# Patient Record
Sex: Male | Born: 1960 | Race: Black or African American | Hispanic: No | Marital: Married | Smoking: Former smoker
Health system: Southern US, Community
[De-identification: ages and names within clinical notes are randomized; demographics above are authoritative.]

## PROBLEM LIST (undated history)

## (undated) DIAGNOSIS — C259 Malignant neoplasm of pancreas, unspecified: Secondary | ICD-10-CM

## (undated) DIAGNOSIS — K219 Gastro-esophageal reflux disease without esophagitis: Secondary | ICD-10-CM

## (undated) DIAGNOSIS — C801 Malignant (primary) neoplasm, unspecified: Secondary | ICD-10-CM

## (undated) DIAGNOSIS — D649 Anemia, unspecified: Secondary | ICD-10-CM

## (undated) DIAGNOSIS — I10 Essential (primary) hypertension: Secondary | ICD-10-CM

---

## 1999-08-18 ENCOUNTER — Emergency Department (HOSPITAL_COMMUNITY): Admission: EM | Admit: 1999-08-18 | Discharge: 1999-08-18 | Payer: Self-pay | Admitting: Emergency Medicine

## 1999-08-18 ENCOUNTER — Encounter: Payer: Self-pay | Admitting: Emergency Medicine

## 2005-12-19 ENCOUNTER — Emergency Department (HOSPITAL_COMMUNITY): Admission: EM | Admit: 2005-12-19 | Discharge: 2005-12-19 | Payer: Self-pay | Admitting: *Deleted

## 2006-08-20 ENCOUNTER — Emergency Department (HOSPITAL_COMMUNITY): Admission: EM | Admit: 2006-08-20 | Discharge: 2006-08-20 | Payer: Self-pay | Admitting: Emergency Medicine

## 2010-12-29 LAB — URINALYSIS, ROUTINE W REFLEX MICROSCOPIC
Bilirubin Urine: NEGATIVE
Glucose, UA: NEGATIVE
Hgb urine dipstick: NEGATIVE
Ketones, ur: NEGATIVE
Nitrite: NEGATIVE
Protein, ur: NEGATIVE
Specific Gravity, Urine: 1.022
Urobilinogen, UA: 2 — ABNORMAL HIGH
pH: 6

## 2016-02-17 ENCOUNTER — Encounter (HOSPITAL_COMMUNITY): Payer: Self-pay

## 2016-02-17 ENCOUNTER — Emergency Department (HOSPITAL_COMMUNITY)
Admission: EM | Admit: 2016-02-17 | Discharge: 2016-02-17 | Disposition: A | Discharge status: Home / Self Care | Payer: Commercial Managed Care - PPO | Attending: Emergency Medicine | Admitting: Emergency Medicine

## 2016-02-17 ENCOUNTER — Emergency Department (HOSPITAL_COMMUNITY): Payer: Commercial Managed Care - PPO

## 2016-02-17 DIAGNOSIS — Y990 Civilian activity done for income or pay: Secondary | ICD-10-CM | POA: Diagnosis not present

## 2016-02-17 DIAGNOSIS — Y929 Unspecified place or not applicable: Secondary | ICD-10-CM | POA: Insufficient documentation

## 2016-02-17 DIAGNOSIS — I10 Essential (primary) hypertension: Secondary | ICD-10-CM | POA: Insufficient documentation

## 2016-02-17 DIAGNOSIS — Y9389 Activity, other specified: Secondary | ICD-10-CM | POA: Insufficient documentation

## 2016-02-17 DIAGNOSIS — R0789 Other chest pain: Secondary | ICD-10-CM | POA: Diagnosis not present

## 2016-02-17 DIAGNOSIS — X500XXA Overexertion from strenuous movement or load, initial encounter: Secondary | ICD-10-CM | POA: Diagnosis not present

## 2016-02-17 DIAGNOSIS — R079 Chest pain, unspecified: Secondary | ICD-10-CM | POA: Diagnosis present

## 2016-02-17 HISTORY — DX: Essential (primary) hypertension: I10

## 2016-02-17 LAB — BASIC METABOLIC PANEL
Anion gap: 7 (ref 5–15)
BUN: 14 mg/dL (ref 6–20)
CHLORIDE: 107 mmol/L (ref 101–111)
CO2: 23 mmol/L (ref 22–32)
Calcium: 9.2 mg/dL (ref 8.9–10.3)
Creatinine, Ser: 1.38 mg/dL — ABNORMAL HIGH (ref 0.61–1.24)
GFR calc non Af Amer: 57 mL/min — ABNORMAL LOW (ref >60)
Glucose, Bld: 101 mg/dL — ABNORMAL HIGH (ref 65–99)
POTASSIUM: 3.5 mmol/L (ref 3.5–5.1)
Sodium: 137 mmol/L (ref 135–145)

## 2016-02-17 LAB — I-STAT TROPONIN, ED: TROPONIN I, POC: 0.03 ng/mL (ref 0.00–0.08)

## 2016-02-17 LAB — CBC
HEMATOCRIT: 38.5 % — AB (ref 39.0–52.0)
HEMOGLOBIN: 12.3 g/dL — AB (ref 13.0–17.0)
MCH: 27.5 pg (ref 26.0–34.0)
MCHC: 31.9 g/dL (ref 30.0–36.0)
MCV: 85.9 fL (ref 78.0–100.0)
Platelets: 259 10*3/uL (ref 150–400)
RBC: 4.48 MIL/uL (ref 4.22–5.81)
RDW: 14.6 % (ref 11.5–15.5)
WBC: 5.9 10*3/uL (ref 4.0–10.5)

## 2016-02-17 NOTE — ED Provider Notes (Signed)
[facility] DEPT Provider Note   CSN: [id number] Arrival date & time: 02/17/16  1923     History   Chief Complaint Chief Complaint  Patient presents with  . Chest Pain    HPI Frank Garrett is a 55 y.o. male.  HPI Patient is a 55 year old male with past medical history of hypertension who presents with left-sided chest pain. Patient reports chest pain started this morning around 11 AM he was lifting heavy boards at work. It is located over the left lower part of his chest and does not radiate elsewhere. No associated symptoms. Denies diaphoresis, nausea, vomiting, short of breath. His pain persisted throughout the day and patient went to urgent care for evaluation. EKG performed there showed abnormalities and patient was referred to the emergency department for further evaluation. Unfortunately was unavailable for review. Patient reports he was told his heart rate was too low. He wears a Fit-Bit monitor and states his normal heart rate is in the 50s. Patient quit smoking 5 years ago. Denies illicit substances including cocaine use. No recent fever, cough, vomiting, diarrhea or other illness. No known family history of CAD.   Past Medical History:  Diagnosis Date  . Hypertension     There are no active problems to display for this patient.   History reviewed. No pertinent surgical history.     Home Medications    Prior to Admission medications   Not on File    Family History History reviewed. No pertinent family history.  Social History Social History  Substance Use Topics  . Smoking status: Never Smoker  . Smokeless tobacco: Never Used  . Alcohol use No     Allergies   Patient has no known allergies.   Review of Systems Review of Systems  Constitutional: Negative for chills and fever.  HENT: Negative for ear pain and sore throat.   Eyes: Negative for pain and visual disturbance.  Respiratory: Negative for cough and shortness of breath.   Cardiovascular:  Positive for chest pain. Negative for palpitations.  Gastrointestinal: Negative for abdominal pain and vomiting.  Genitourinary: Negative for dysuria and hematuria.  Musculoskeletal: Negative for arthralgias and back pain.  Skin: Negative for color change and rash.  Neurological: Negative for seizures and syncope.  All other systems reviewed and are negative.    Physical Exam Updated Vital Signs BP 135/85 (BP Location: Right Arm)   Pulse (!) 51   Temp 98 F (36.7 C) (Oral)   Resp 16   Ht 5\' 8"  (1.727 m)   Wt 89.8 kg   SpO2 100%   BMI 30.11 kg/m   Physical Exam  Constitutional: He appears well-developed and well-nourished.  HENT:  Head: Normocephalic and atraumatic.  Eyes: Conjunctivae are normal.  Neck: Neck supple.  Cardiovascular: Normal rate and regular rhythm.   No murmur heard. Pulmonary/Chest: Effort normal and breath sounds normal. No respiratory distress. He has no wheezes. He has no rales.  Abdominal: Soft. There is no tenderness.  Musculoskeletal: He exhibits no edema.  Neurological: He is alert.  Skin: Skin is warm and dry.  Psychiatric: He has a normal mood and affect.  Nursing note and vitals reviewed.    ED Treatments / Results  Labs (all labs ordered are listed, but only abnormal results are displayed) Labs Reviewed  CBC - Abnormal; Notable for the following:       Result Value   Hemoglobin 12.3 (*)    HCT 38.5 (*)    All other components within normal  limits  BASIC METABOLIC PANEL  I-STAT TROPOININ, ED    EKG  EKG Interpretation  Date/Time:  Thursday February 17 2016 19:56:22 EST Ventricular Rate:  50 PR Interval:  182 QRS Duration: 100 QT Interval:  442 QTC Calculation: 402 R Axis:   78 Text Interpretation:  Sinus bradycardia ST & T wave abnormality, consider lateral ischemia Abnormal ECG No significant change since last tracing Confirmed by Gerald Leitz ([id number]) on 02/17/2016 8:03:16 PM       Radiology Dg Chest 2  View  Result Date: 02/17/2016 CLINICAL DATA:  55 year old male with left side chest pain acute onset today. Initial encounter. EXAM: CHEST  2 VIEW COMPARISON:  None. FINDINGS: Normal cardiac size and mediastinal contours. Visualized tracheal air column is within normal limits. Lung volumes at the upper limits of normal. No pneumothorax, pulmonary edema, pleural effusion or confluent pulmonary opacity. Mild upper thoracic scoliosis. No acute osseous abnormality identified. Negative visible bowel gas pattern. IMPRESSION: No acute cardiopulmonary abnormality. Electronically Signed   By: Genevie Ziggy Reveles M.D.   On: 02/17/2016 19:53    Procedures Procedures (including critical care time)  Medications Ordered in ED Medications - No data to display   Initial Impression / Assessment and Plan / ED Course  I have reviewed the triage vital signs and the nursing notes.  Pertinent labs & imaging results that were available during my care of the patient were reviewed by me and considered in my medical decision making (see chart for details).  Clinical Course    Patient is a 55 year old male who presents with left sided chest pain. Afebrile, VSS. Bradycardic (patient states heart rate is normal for him) with no other focal findings on exam. Discomfort is reproduced with sitting and twisting. Received full dose ASA and took ibuprofen prior to arrival with some relief. EKG shows sinus bradycardia with nonspecific ST and T wave changes in lateral leads. Chest x-ray unremarkable. CBC, BMP unremarkable. Troponin WNL. Pain is reproducible on exam. History and findings are not consistent with cardiac chest pain. Patient has had constant chest pain since 11 AM this morning and does not have elevation in his troponin. Do not think delta troponin is necessary. Patient is overall low risk other than age of 62, does not have hypoxia or tachycardia, does not have calf swelling or tenderness, doubt PE. No signs of pneumonia, fluid  overload, or widened mediastinum on CXR. Likely musculoskeletal chest pain.   Patient was discharged in stable condition. Strict chest pain return precautions given. Advised follow-up with primary doctor for reevaluation. Advised rest and Tylenol for pain. Patient reports understanding and agreement with plan.  Patient seen and discussed with Dr. Laverta Baltimore, ED attending  Final Clinical Impressions(s) / ED Diagnoses   Final diagnoses:  Atypical chest pain    New Prescriptions New Prescriptions   No medications on file     Gibson Ramp, MD 02/18/16 0234    Margette Fast, MD 02/18/16 [phone]

## 2016-02-17 NOTE — Discharge Instructions (Signed)
Based on your examination, labs and imaging today the chest pain your experiencing is likely musculoskeletal pain from heavy lifting. He may take Tylenol or ibuprofen at home for pain.   However your EKG shows some changes. This may be normal for you, but we do not have a comparison EKG available. You should follow up with your primary care doctor to discuss whether you need referral to cardiology for further outpatient testing.  If you experience severely worsening chest pain, with associated shortness of breath, sweating, vomiting or any other new or worsening symptoms return to the emergency department for evaluation.

## 2016-02-17 NOTE — ED Notes (Signed)
EDP would like KG repeated in 15 mins.  Pt was taken to x-ray.

## 2016-02-17 NOTE — ED Triage Notes (Signed)
Pt reports left side chest pain since this morning when he was working. Pt went to [facility] and was told he had an irregular EKG and was sent here. Denies SOB, n/v, dizziness or any other sx.

## 2016-02-17 NOTE — ED Notes (Signed)
Pt departed in NAD, refused use of wheelchair.  

## 2016-04-13 DIAGNOSIS — C801 Malignant (primary) neoplasm, unspecified: Secondary | ICD-10-CM

## 2016-04-13 HISTORY — DX: Malignant (primary) neoplasm, unspecified: C80.1

## 2016-04-17 DIAGNOSIS — D638 Anemia in other chronic diseases classified elsewhere: Secondary | ICD-10-CM

## 2016-04-28 DIAGNOSIS — R7989 Other specified abnormal findings of blood chemistry: Secondary | ICD-10-CM | POA: Insufficient documentation

## 2016-05-17 DIAGNOSIS — K8689 Other specified diseases of pancreas: Secondary | ICD-10-CM | POA: Insufficient documentation

## 2016-06-27 ENCOUNTER — Emergency Department (HOSPITAL_BASED_OUTPATIENT_CLINIC_OR_DEPARTMENT_OTHER)
Admission: EM | Admit: 2016-06-27 | Discharge: 2016-06-27 | Disposition: A | Discharge status: Home / Self Care | Payer: Commercial Managed Care - PPO | Source: Home / Self Care | Attending: Emergency Medicine | Admitting: Emergency Medicine

## 2016-06-27 ENCOUNTER — Encounter (HOSPITAL_COMMUNITY): Payer: Self-pay | Admitting: Emergency Medicine

## 2016-06-27 ENCOUNTER — Emergency Department (HOSPITAL_COMMUNITY)
Admit: 2016-06-27 | Discharge: 2016-06-27 | Disposition: A | Discharge status: Home / Self Care | Payer: Commercial Managed Care - PPO | Attending: Emergency Medicine | Admitting: Emergency Medicine

## 2016-06-27 ENCOUNTER — Emergency Department (HOSPITAL_COMMUNITY)
Admission: EM | Admit: 2016-06-27 | Discharge: 2016-06-27 | Disposition: A | Discharge status: Home / Self Care | Payer: Commercial Managed Care - PPO | Attending: Dermatology | Admitting: Dermatology

## 2016-06-27 DIAGNOSIS — M7989 Other specified soft tissue disorders: Secondary | ICD-10-CM | POA: Insufficient documentation

## 2016-06-27 DIAGNOSIS — Z79899 Other long term (current) drug therapy: Secondary | ICD-10-CM | POA: Insufficient documentation

## 2016-06-27 DIAGNOSIS — I10 Essential (primary) hypertension: Secondary | ICD-10-CM | POA: Diagnosis not present

## 2016-06-27 DIAGNOSIS — Z5321 Procedure and treatment not carried out due to patient leaving prior to being seen by health care provider: Secondary | ICD-10-CM | POA: Diagnosis not present

## 2016-06-27 DIAGNOSIS — Z859 Personal history of malignant neoplasm, unspecified: Secondary | ICD-10-CM | POA: Insufficient documentation

## 2016-06-27 HISTORY — DX: Malignant (primary) neoplasm, unspecified: C80.1

## 2016-06-27 NOTE — Progress Notes (Signed)
Attempted to perform left upper extremity venous duplex. Patient not in emergency department at this time, please contact us if patient returns.  06/27/2016 1:18 PM Maudry Mayhew, BS, RVT, [place], [place]  [facility] [phone]

## 2016-06-27 NOTE — Progress Notes (Addendum)
*  PRELIMINARY RESULTS* Vascular Ultrasound Left upper extremity venous duplex has been completed.  Preliminary findings: Findings of DVT involving the left subclavian, axillary, and brachial veins.  Per Kieth Brightly with Dr. Kaleen Mask, they are calling in Rx for Xarelto to patient's pharmacy. Patient can leave. 2:48 PM      Performed by: Maudry Mayhew, RDMS, RDCS, RVT Note entered by: Landry Mellow, RDMS, RVT  06/27/2016

## 2016-06-27 NOTE — ED Triage Notes (Signed)
Pt here for doppler of left upper extremity; pt with port placed 4 weeks ago for treatment of pancreatic CA and now has swelling and stiffness

## 2016-06-27 NOTE — ED Notes (Signed)
Patient transported to Ultrasound 

## 2016-07-11 ENCOUNTER — Emergency Department (HOSPITAL_COMMUNITY): Payer: Commercial Managed Care - PPO

## 2016-07-11 ENCOUNTER — Encounter (HOSPITAL_COMMUNITY): Payer: Self-pay | Admitting: Emergency Medicine

## 2016-07-11 ENCOUNTER — Emergency Department (HOSPITAL_BASED_OUTPATIENT_CLINIC_OR_DEPARTMENT_OTHER)
Admit: 2016-07-11 | Discharge: 2016-07-11 | Disposition: A | Discharge status: Home / Self Care | Payer: Commercial Managed Care - PPO

## 2016-07-11 ENCOUNTER — Emergency Department (HOSPITAL_COMMUNITY)
Admission: EM | Admit: 2016-07-11 | Discharge: 2016-07-11 | Disposition: A | Discharge status: Home / Self Care | Payer: Commercial Managed Care - PPO | Attending: Emergency Medicine | Admitting: Emergency Medicine

## 2016-07-11 DIAGNOSIS — Z79899 Other long term (current) drug therapy: Secondary | ICD-10-CM | POA: Diagnosis not present

## 2016-07-11 DIAGNOSIS — I10 Essential (primary) hypertension: Secondary | ICD-10-CM | POA: Insufficient documentation

## 2016-07-11 DIAGNOSIS — M7989 Other specified soft tissue disorders: Secondary | ICD-10-CM

## 2016-07-11 DIAGNOSIS — I82A12 Acute embolism and thrombosis of left axillary vein: Secondary | ICD-10-CM | POA: Diagnosis not present

## 2016-07-11 DIAGNOSIS — R2232 Localized swelling, mass and lump, left upper limb: Secondary | ICD-10-CM | POA: Diagnosis present

## 2016-07-11 MED ORDER — RIVAROXABAN (XARELTO) VTE STARTER PACK (15 & 20 MG): ORAL_TABLET | ORAL | 0 refills | Status: DC

## 2016-07-11 NOTE — Progress Notes (Signed)
*  PRELIMINARY RESULTS* Vascular Ultrasound Left upper extremity venous duplex has been completed.  Preliminary findings: the left upper extremity is positive for deep vein thrombosis in the subclavian and axillary veins.  Superficial vein thrombosis is noted in the basilic vein.  Exam appears unchanged from prior study.  Everrett Coombe 07/11/2016, 6:03 PM

## 2016-07-11 NOTE — ED Triage Notes (Signed)
Pt here for swelling in left arm with hx of blood clots; pt on xerelto and has port a cath on that side for chemo

## 2016-07-11 NOTE — ED Provider Notes (Signed)
[facility] DEPT Provider Note   CSN: [id number] Arrival date & time: 07/11/16  1510     History   Chief Complaint Chief Complaint  Patient presents with  . Arm Swelling    HPI Frank Garrett is a 56 y.o. male.  The history is provided by the patient, the spouse and medical records.  Arm Injury   This is a recurrent problem. Episode onset: This morning. The problem occurs constantly. The problem has not changed since onset.The pain is present in the left arm. Quality: Swelling, not pain. The patient is experiencing no pain. Pertinent negatives include no numbness, full range of motion, no stiffness and no tingling. Treatments tried: on Xarelto. There has been no history of extremity trauma.    Past Medical History:  Diagnosis Date  . Cancer ([place])   . Hypertension     There are no active problems to display for this patient.   History reviewed. No pertinent surgical history.     Home Medications    Prior to Admission medications   Not on File    Family History History reviewed. No pertinent family history.  Social History Social History  Substance Use Topics  . Smoking status: Never Smoker  . Smokeless tobacco: Never Used  . Alcohol use No     Allergies   Patient has no known allergies.   Review of Systems Review of Systems  Constitutional: Negative for chills and fever.  HENT: Negative for ear pain and sore throat.   Eyes: Negative for pain and visual disturbance.  Respiratory: Negative for cough and shortness of breath.   Cardiovascular: Negative for chest pain and palpitations.  Gastrointestinal: Negative for abdominal pain and vomiting.  Genitourinary: Negative for dysuria and hematuria.  Musculoskeletal: Negative for arthralgias, back pain and stiffness.       Arm swelling  Skin: Negative for color change and rash.  Neurological: Negative for tingling, seizures, syncope and numbness.  All other systems reviewed and are  negative.    Physical Exam Updated Vital Signs BP 98/63 (BP Location: Right Arm)   Pulse 84   Temp 99.1 F (37.3 C) (Oral)   Resp 17   Ht 5' (1.524 m)   Wt 77.6 kg   SpO2 99%   BMI 33.40 kg/m   Physical Exam  Constitutional: He appears well-developed and well-nourished.  HENT:  Head: Normocephalic and atraumatic.  Eyes: Conjunctivae are normal.  Neck: Neck supple.  Cardiovascular: Normal rate and regular rhythm.   No murmur heard. Pulmonary/Chest: Effort normal and breath sounds normal. No respiratory distress.  Abdominal: Soft. There is no tenderness.  Musculoskeletal: He exhibits no edema.  Mild swelling of left upper arm w/soft compartments, FROM at left shoulder/elbow/wrist w/o pain, no overlying cellulitic changes, symmetric radial pulses  Neurological: He is alert.  Full motor and sensory function in the LUE  Skin: Skin is warm and dry.  Psychiatric: He has a normal mood and affect.  Nursing note and vitals reviewed.   ED Treatments / Results  Labs (all labs ordered are listed, but only abnormal results are displayed) Labs Reviewed - No data to display  EKG  EKG Interpretation None       Radiology Dg Chest Portable 1 View  Result Date: 07/11/2016 CLINICAL DATA:  Left arm swelling.  History of hypertension. EXAM: PORTABLE CHEST 1 VIEW COMPARISON:  02/17/2016. FINDINGS: 1642 hour. Left subclavian Port-A-Cath tip extends to the SVC right atrial junction. The heart size and mediastinal contours are stable. The  lungs are clear. There is no pleural effusion or pneumothorax. No acute osseous findings are evident. IMPRESSION: No active cardiopulmonary process. Left subclavian Port-A-Cath appears satisfactorily positioned. Electronically Signed   By: Richardean Sale M.D.   On: 07/11/2016 16:53    Procedures Procedures (including critical care time)  Medications Ordered in ED Medications - No data to display   Initial Impression / Assessment and Plan / ED  Course  I have reviewed the triage vital signs and the nursing notes.  Pertinent labs & imaging results that were available during my care of the patient were reviewed by me and considered in my medical decision making (see chart for details).    Pt with h/o pancreatic CA & prior DVT presents with left arm swelling.  Diagnosed w/thrombus in left subclavian, left axillary & left brachial veins on 06/28/16; was initially on Xarelto 20mg  QDx7d, but was told to start taking 15mg  QD starting on day 8 (wife mentions that there was some debate b/w the Oncologist & the Pharmacist when they were deciding on his a/c regimen, but she doesn't know the specifics). This morning, he noticed some tightness in his arm this morning, so came back tot he ED b/c he's concerned the clots may be back. Denies F/C, HA, lightheadedness, cough, CP, SOB, N/V/D, urinary symptoms. Last chemo 4/27 & has been doing well since.  VS & exam as above. CXR WNL. LUE [place] essentially unchanged w/thrombosis in left subclavian, axillary, & brachial veins.  Explained all results to the Pt & wife. Will discharge the Pt home with prescription for Xarelto starter pack. Recommending they call their Oncologist in the morning. ED return precautions provided. Pt acknowledged understanding of, and concurrence with the plan. All questions answered to his satisfaction. In stable condition at the time of discharge.  Final Clinical Impressions(s) / ED Diagnoses   Final diagnoses:  Acute deep vein thrombosis (DVT) of axillary vein of left upper extremity (HCC)    New Prescriptions New Prescriptions   RIVAROXABAN 15 & 20 MG TBPK    Take as directed on package: Start with one 15mg  tablet by mouth twice a day with food. On Day 22, switch to one 20mg  tablet once a day with food.     Jenny Reichmann, MD 07/11/16 1846    Duffy Bruce, MD 07/13/16 [phone]

## 2016-07-12 ENCOUNTER — Other Ambulatory Visit: Payer: Self-pay | Admitting: *Deleted

## 2016-10-19 HISTORY — PX: PANCREAS SURGERY: SHX731

## 2016-10-19 HISTORY — PX: SPLENECTOMY, TOTAL: SHX788

## 2016-10-19 HISTORY — PX: DISTAL PANCREATECTOMY: SHX1468

## 2016-10-19 HISTORY — PX: CHOLECYSTECTOMY: SHX55

## 2016-11-09 ENCOUNTER — Inpatient Hospital Stay (HOSPITAL_COMMUNITY)
Admission: EM | Admit: 2016-11-09 | Discharge: 2016-11-11 | DRG: 862 | Disposition: A | Discharge status: Home Health | Payer: Commercial Managed Care - PPO | Attending: Internal Medicine | Admitting: Internal Medicine

## 2016-11-09 ENCOUNTER — Emergency Department (HOSPITAL_COMMUNITY): Payer: Commercial Managed Care - PPO

## 2016-11-09 ENCOUNTER — Encounter (HOSPITAL_COMMUNITY): Payer: Self-pay

## 2016-11-09 DIAGNOSIS — T8130XA Disruption of wound, unspecified, initial encounter: Secondary | ICD-10-CM | POA: Diagnosis present

## 2016-11-09 DIAGNOSIS — Z79899 Other long term (current) drug therapy: Secondary | ICD-10-CM

## 2016-11-09 DIAGNOSIS — Z7901 Long term (current) use of anticoagulants: Secondary | ICD-10-CM | POA: Diagnosis not present

## 2016-11-09 DIAGNOSIS — Z8507 Personal history of malignant neoplasm of pancreas: Secondary | ICD-10-CM | POA: Diagnosis not present

## 2016-11-09 DIAGNOSIS — Z86718 Personal history of other venous thrombosis and embolism: Secondary | ICD-10-CM | POA: Diagnosis not present

## 2016-11-09 DIAGNOSIS — Z885 Allergy status to narcotic agent status: Secondary | ICD-10-CM | POA: Diagnosis not present

## 2016-11-09 DIAGNOSIS — K651 Peritoneal abscess: Secondary | ICD-10-CM | POA: Diagnosis present

## 2016-11-09 DIAGNOSIS — T814XXA Infection following a procedure, initial encounter: Secondary | ICD-10-CM | POA: Diagnosis present

## 2016-11-09 DIAGNOSIS — K910 Vomiting following gastrointestinal surgery: Secondary | ICD-10-CM | POA: Diagnosis present

## 2016-11-09 DIAGNOSIS — E872 Acidosis, unspecified: Secondary | ICD-10-CM

## 2016-11-09 DIAGNOSIS — N179 Acute kidney failure, unspecified: Secondary | ICD-10-CM | POA: Diagnosis present

## 2016-11-09 DIAGNOSIS — Z90411 Acquired partial absence of pancreas: Secondary | ICD-10-CM | POA: Diagnosis not present

## 2016-11-09 DIAGNOSIS — Z87891 Personal history of nicotine dependence: Secondary | ICD-10-CM

## 2016-11-09 DIAGNOSIS — Z8619 Personal history of other infectious and parasitic diseases: Secondary | ICD-10-CM | POA: Diagnosis not present

## 2016-11-09 DIAGNOSIS — Y848 Other medical procedures as the cause of abnormal reaction of the patient, or of later complication, without mention of misadventure at the time of the procedure: Secondary | ICD-10-CM | POA: Diagnosis present

## 2016-11-09 DIAGNOSIS — Z9081 Acquired absence of spleen: Secondary | ICD-10-CM

## 2016-11-09 DIAGNOSIS — A419 Sepsis, unspecified organism: Secondary | ICD-10-CM | POA: Diagnosis present

## 2016-11-09 DIAGNOSIS — IMO0001 Reserved for inherently not codable concepts without codable children: Secondary | ICD-10-CM

## 2016-11-09 DIAGNOSIS — Z9049 Acquired absence of other specified parts of digestive tract: Secondary | ICD-10-CM | POA: Diagnosis not present

## 2016-11-09 DIAGNOSIS — R748 Abnormal levels of other serum enzymes: Secondary | ICD-10-CM

## 2016-11-09 DIAGNOSIS — R188 Other ascites: Secondary | ICD-10-CM | POA: Diagnosis present

## 2016-11-09 DIAGNOSIS — R112 Nausea with vomiting, unspecified: Secondary | ICD-10-CM | POA: Diagnosis not present

## 2016-11-09 DIAGNOSIS — I10 Essential (primary) hypertension: Secondary | ICD-10-CM | POA: Diagnosis present

## 2016-11-09 DIAGNOSIS — R7989 Other specified abnormal findings of blood chemistry: Secondary | ICD-10-CM | POA: Diagnosis not present

## 2016-11-09 DIAGNOSIS — Z9221 Personal history of antineoplastic chemotherapy: Secondary | ICD-10-CM

## 2016-11-09 DIAGNOSIS — E860 Dehydration: Secondary | ICD-10-CM | POA: Diagnosis present

## 2016-11-09 HISTORY — DX: Gastro-esophageal reflux disease without esophagitis: K21.9

## 2016-11-09 HISTORY — DX: Anemia, unspecified: D64.9

## 2016-11-09 LAB — CBC
HEMATOCRIT: 37.2 % — AB (ref 39.0–52.0)
Hemoglobin: 11.6 g/dL — ABNORMAL LOW (ref 13.0–17.0)
MCH: 26.7 pg (ref 26.0–34.0)
MCHC: 31.2 g/dL (ref 30.0–36.0)
MCV: 85.5 fL (ref 78.0–100.0)
Platelets: 699 10*3/uL — ABNORMAL HIGH (ref 150–400)
RBC: 4.35 MIL/uL (ref 4.22–5.81)
RDW: 16.3 % — ABNORMAL HIGH (ref 11.5–15.5)
WBC: 11.3 10*3/uL — AB (ref 4.0–10.5)

## 2016-11-09 LAB — COMPREHENSIVE METABOLIC PANEL
ALT: 64 U/L — ABNORMAL HIGH (ref 17–63)
AST: 27 U/L (ref 15–41)
Albumin: 3.6 g/dL (ref 3.5–5.0)
Alkaline Phosphatase: 401 U/L — ABNORMAL HIGH (ref 38–126)
Anion gap: 19 — ABNORMAL HIGH (ref 5–15)
BUN: 26 mg/dL — AB (ref 6–20)
CHLORIDE: 100 mmol/L — AB (ref 101–111)
CO2: 22 mmol/L (ref 22–32)
Calcium: 9.7 mg/dL (ref 8.9–10.3)
Creatinine, Ser: 2.26 mg/dL — ABNORMAL HIGH (ref 0.61–1.24)
GFR, EST AFRICAN AMERICAN: 36 mL/min — AB (ref >60)
GFR, EST NON AFRICAN AMERICAN: 31 mL/min — AB (ref >60)
Glucose, Bld: 113 mg/dL — ABNORMAL HIGH (ref 65–99)
POTASSIUM: 4.2 mmol/L (ref 3.5–5.1)
Sodium: 141 mmol/L (ref 135–145)
Total Bilirubin: 1.2 mg/dL (ref 0.3–1.2)
Total Protein: 8.6 g/dL — ABNORMAL HIGH (ref 6.5–8.1)

## 2016-11-09 LAB — I-STAT CG4 LACTIC ACID, ED
LACTIC ACID, VENOUS: 4.11 mmol/L — AB (ref 0.5–1.9)
LACTIC ACID, VENOUS: 1.04 mmol/L (ref 0.5–1.9)

## 2016-11-09 LAB — LIPASE, BLOOD: LIPASE: 111 U/L — AB (ref 11–51)

## 2016-11-09 MED ORDER — PIPERACILLIN-TAZOBACTAM 3.375 G IVPB
3.3750 g | Freq: Three times a day (TID) | INTRAVENOUS | Status: DC
  Administered 2016-11-09 – 2016-11-10 (×2): 3.375 g via INTRAVENOUS
  Filled 2016-11-09 (×4): qty 50

## 2016-11-09 MED ORDER — ONDANSETRON HCL 4 MG/2ML IJ SOLN
4.0000 mg | Freq: Four times a day (QID) | INTRAMUSCULAR | Status: DC | PRN
  Administered 2016-11-10: 4 mg via INTRAVENOUS
  Filled 2016-11-09: qty 2

## 2016-11-09 MED ORDER — SODIUM CHLORIDE 0.9 % IV BOLUS (SEPSIS)
1000.0000 mL | Freq: Once | INTRAVENOUS | Status: AC
  Administered 2016-11-09: 1000 mL via INTRAVENOUS

## 2016-11-09 MED ORDER — SODIUM CHLORIDE 0.9 % IV BOLUS (SEPSIS)
2000.0000 mL | Freq: Once | INTRAVENOUS | Status: AC
  Administered 2016-11-09: 2000 mL via INTRAVENOUS

## 2016-11-09 MED ORDER — ONDANSETRON HCL 4 MG/2ML IJ SOLN
4.0000 mg | Freq: Once | INTRAMUSCULAR | Status: AC
  Administered 2016-11-09: 4 mg via INTRAVENOUS
  Filled 2016-11-09: qty 2

## 2016-11-09 MED ORDER — PIPERACILLIN-TAZOBACTAM 3.375 G IVPB 30 MIN
3.3750 g | Freq: Once | INTRAVENOUS | Status: AC
  Administered 2016-11-09: 3.375 g via INTRAVENOUS
  Filled 2016-11-09: qty 50

## 2016-11-09 MED ORDER — SODIUM CHLORIDE 0.9 % IV SOLN
INTRAVENOUS | Status: DC
  Administered 2016-11-09 – 2016-11-11 (×4): via INTRAVENOUS

## 2016-11-09 MED ORDER — PROMETHAZINE HCL 25 MG/ML IJ SOLN: 12.5000 mg | Freq: Four times a day (QID) | INTRAMUSCULAR | Status: DC | PRN

## 2016-11-09 MED ORDER — ENOXAPARIN SODIUM 40 MG/0.4ML ~~LOC~~ SOLN
40.0000 mg | SUBCUTANEOUS | Status: DC
  Administered 2016-11-09 – 2016-11-10 (×2): 40 mg via SUBCUTANEOUS
  Filled 2016-11-09 (×2): qty 0.4

## 2016-11-09 NOTE — H&P (Signed)
Internal Medicine Teaching Service 11/09/2016 Attending Physician: Dr. Virgel Manifold, MD  Patient Name:  Frank Garrett Age: 56 y.o.  DOB: 22-Feb-1961 Gender: male  PCP: Gregor Hams, FNP MRN: [id number]  First Contact: Eston Esters, MS3 Pager: [phone]  Second Dr. Colbert Ewing, MD Pager:  [phone]  Third Contact: Dr. Eppie Gibson    After Hours Contact (After 5 PM / Weekends / Holidays) First: [phone] Second: [phone]     Chief Complaint:  Chief Complaint  Patient presents with  . Emesis    History of Present Illness: Eschol Auxier is a 56 y.o. male s/p pancreaticoduodenectomy, spleen resection, cholecystectomy on 02/58/52 at [facility] complicated by post-op wound dishesence and wound vac placement due to n/v who presents with nausea and vomiting. Per the patient & family, he presented with abdominal pain & nausea in 04/2016 which did not resolve with metamucil, so his physician ordered a CT & he was found to have pancreatic mass. EGD & FNA were positive for pancreatic adenocarcinoma, so he had neoadjuvant chemotherapy followed by a pancreaticoduodenectomy, spleen resection, cholecystectomy on 10/19/16 at the [facility] in [place], [place]. This was complicated by wound dehiscence so a wound vac was placed, and he returned to [facility] on 11/06/16. He was doing well, eating and drinking without excessive vomiting until yesterday when he began vomiting green bilious vomit & was unable to keep food or liquid down since breakfast yesterday (08/29). He denies any blood in the vomit or any fevers or chills. Patient reports C. diff infection after his whipple procedure, treated with Flagyl.  Prior History  Allergies: Oxycodone - Abdominal pain  Current Medications: Current Facility-Administered Medications  Medication Dose Route Frequency Provider Last Rate Last Dose  piperacillin-tazobactam (ZOSYN) IVPB 3.375 g  3.375 g Intravenous Q8H Rumbarger, Valeda Malm, RPH       Medication Sig    Astragalus Extract POWD Take 2 tablets by mouth 2 (two) times daily.  Cholecalciferol (VITAMIN D3) 5000 units CAPS Take 5,000 Units by mouth daily.  Cyanocobalamin (VITAMIN B-12 PO) Take 1 tablet by mouth daily.  famotidine (PEPCID) 20 MG tablet Take 20 mg by mouth 2 (two) times daily.  gabapentin (NEURONTIN) 300 MG capsule Take 300 mg by mouth 3 (three) times daily as needed (neuropathy).  ibuprofen (ADVIL,MOTRIN) 800 MG tablet Take 800 mg by mouth every 8 (eight) hours as needed for moderate pain.  LACTOBACILLUS RHAMNOSUS, GG, PO Take 1 capsule by mouth 2 (two) times daily.  lipase/protease/amylase (CREON) 36000 UNITS CPEP capsule Take 36,000 Units by mouth 3 (three) times daily before meals.  loperamide (IMODIUM) 2 MG capsule Take 2 mg by mouth every 2 (two) hours as needed for diarrhea or loose stools.  Melatonin 10 MG CAPS Take 10 mg by mouth at bedtime.  metoCLOPramide (REGLAN) 10 MG tablet Take 10 mg by mouth daily as needed for nausea. Pt takes if Zofran ineffective  metroNIDAZOLE (FLAGYL) 500 MG tablet Take 500 mg by mouth every 8 (eight) hours.  Nutritional Supplements (FIRST INTENTION PO) Take 2 capsules by mouth 3 (three) times daily.  prochlorperazine (COMPAZINE) 10 MG tablet Take 10 mg by mouth 3 (three) times daily as needed for nausea or vomiting.   rivaroxaban (XARELTO) 20 MG TABS tablet Take 20 mg by mouth daily with supper.  traMADol (ULTRAM) 50 MG tablet Take 50 mg by mouth every 4 (four) hours as needed for moderate pain.     Past Medical History:  Diagnosis Date  . Cancer ([place]) 04/2016  Pancreatic Adenocarcinoma  . Hypertension    Family History: Chart reviewed, no pertinent family history  Social: Married, two daughters. Former smoker (~20ppy) & former drinker. Denies any drug use.  .ROS  Review of Systems Constitutional: Negative for diaphoresis, fever, malaise/fatigue, and weight loss.  HEENT: Negative for blurred vision, hearing loss, sinus pain, congestion,  and sore throat. Respiratory: Negative for cough, shortness of breath and wheezing.   Cardiovascular: Negative for chest pain, palpitations, orthopnea, PND, and leg swelling. Gastrointestinal: Negative for abdominal pain, blood in stool, constipation, diarrhea, heartburn, nausea and vomiting.  Genitourinary: Negative for dysuria and hematuria.  Musculoskeletal: Negative for joint pain and myalgias.  Neurological: Negative for dizziness, focal weakness, weakness and headaches.   Physical Exam: BP 130/81   Pulse 74   Temp 98.1 F (36.7 C) (Oral)   Resp 13   Ht 5' 9.5" (1.765 m)   Wt 182 lb 1.6 oz (82.6 kg)   SpO2 100%   BMI 26.51 kg/m   General Appearance:  Initially napping for first half of interview, subsequently woke up very pleasant and cooperative  HEENT:  Normocephalic, without obvious abnormality, atraumatic  Lungs:   Normal work of breathing on room air  Heart:  Regular rate and rhythm, S1 and S2 normal  Abdomen:  Midline incision covered with wound vac  Extremities: Extremities normal, atraumatic, no cyanosis or edema; 2+ pulses. Sensation in RLE diminished  Results: Lab 11/09/16 0730  BUN 26*  CREATININE 2.26*  NA 141  K 4.2  CO2 22  CL 100*  AST 27  ALT 64*  PROT 8.6*   Lab 11/09/16 0730  WBC 11.3*  HGB 11.6*  HCT 37.2*  PLT 699*  MCV 85.5  MCH 26.7  MCHC 31.2  RDW 16.3*    Recent Results (from the past 2160 hour(s))  Lipase, blood     Status: Abnormal 11/09/16 7:30AM  Result Value Ref Range   Lipase 111 (H) 11 - 51 U/L  Comprehensive metabolic panel     Status: Abnormal 11/09/16 7:30AM  Result Value Ref Range   Sodium 141 135 - 145 mmol/L   Potassium 4.2 3.5 - 5.1 mmol/L   Chloride 100 (L) 101 - 111 mmol/L   CO2 22 22 - 32 mmol/L   Glucose, Bld 113 (H) 65 - 99 mg/dL   BUN 26 (H) 6 - 20 mg/dL   Creatinine, Ser 2.26 (H) 0.61 - 1.24 mg/dL   Calcium 9.7 8.9 - 10.3 mg/dL   Total Protein 8.6 (H) 6.5 - 8.1 g/dL   Albumin 3.6 3.5 - 5.0 g/dL   AST  27 15 - 41 U/L   ALT 64 (H) 17 - 63 U/L   Alkaline Phosphatase 401 (H) 38 - 126 U/L   Total Bilirubin 1.2 0.3 - 1.2 mg/dL   GFR calc Af Amer 36 (L) >60 mL/min   Anion gap 19 (H) 5 - 15  CBC     Status: Abnormal   Collection Time: 11/09/16  7:30 AM  Result Value Ref Range   WBC 11.3 (H) 4.0 - 10.5 K/uL   RBC 4.35 4.22 - 5.81 MIL/uL   Hemoglobin 11.6 (L) 13.0 - 17.0 g/dL   HCT 37.2 (L) 39.0 - 52.0 %   MCV 85.5 78.0 - 100.0 fL   MCH 26.7 26.0 - 34.0 pg   MCHC 31.2 30.0 - 36.0 g/dL   RDW 16.3 (H) 11.5 - 15.5 %   Platelets 699 (H) 150 - 400 K/uL  I-Stat CG4  Lactic Acid, ED     Status: Abnormal   Collection Time: 11/09/16 10:12 AM  Result Value Ref Range   Lactic Acid, Venous 4.11 (HH) 0.5 - 1.9 mmol/L  I-Stat CG4 Lactic Acid, ED     Status: None   Collection Time: 11/09/16 12:32 PM  Result Value Ref Range   Lactic Acid, Venous 1.04 0.5 - 1.9 mmol/L    CT Abdomen/Pelvis without contrast 11/09/16  FINDINGS: Lower chest: No acute abnormality. Hepatobiliary: No focal liver abnormality is seen. Status post cholecystectomy. No biliary dilatation. Pancreas: Soft tissue density in the region of the pancreatic head, which may represent the residual pancreatic head, although this would not be expected after Whipple surgery. The distal pancreas is not seen. Spleen: Status post splenectomy. Small amount of fluid in the splenectomy bed extending into the right paracolic gutter. Adrenals/Urinary Tract: Adrenal glands are unremarkable. Kidneys are normal, without renal calculi, focal lesion, or hydronephrosis. Bladder is unremarkable. Stomach/Bowel: Mild wall thickening of the gastric antrum and proximal duodenum, likely reactive. No obstruction. Normal appendix. Vascular/Lymphatic: Aortic atherosclerosis. No enlarged abdominal or pelvic lymph nodes. Reproductive: Prostate is unremarkable. Other: There are two loculated, organized areas of fluid and fat density, one in the gastrocolic ligament measuring  approximately 5.1 x 6.4 x 3.6 cm, and the other along the right aspect of the greater omentum, measuring approximately 3.0 x 5.5 x 3.8 cm (AP by transverse by CC). These two areas may communicate (series 6, image 43). Midline abdominal scar. Musculoskeletal: No acute or significant osseous findings. IMPRESSION: Limited evaluation without intravenous contrast. 1. Postsurgical changes related to distal pancreatectomy and splenectomy. Soft tissue density in the region of the pancreatic head may represent residual pancreas, although this would not be expected after a Whipple procedure. Correlate clinically. 2. Two loculated, organized areas of fluid and fat density in the gastrocolic ligament and along the right aspect of the greater omentum, suspicious for fat necrosis. The sterility of these collections cannot be assessed by imaging. 3. Aortic atherosclerosis (ICD10-I70.0).     XR Abdomen & Chest 11/09/16   FINDINGS: There is no evidence of dilated bowel loops or free intraperitoneal air. No radiopaque calculi or other significant radiographic abnormality is seen. Heart size and mediastinal contours are within normal limits. Both lungs are clear. IMPRESSION: Negative abdominal radiographs.  No acute cardiopulmonary disease.   Assessment: Domonick Sittner is a 56 y.o. male who presented with nausea and vomiting s/p Whipple on 10/19/16 c/b wound dehiscence; n/v likely due to abdominal fluid collections.  Plan by Problem:  Nausea/Vomiting likely due to abdominal abscesses seen on CT. Afebrile but elevated WBC to 11.3, serum creatine elevated to 2.26 and lactic acid now WNL 1.04 (initially elevated to 4.11)  - Consult surgery/IR for drainage of abdominal abscesses  - Continue Zosyn IV per pharmacy recommendations  - PRN Zofran Continue home meds: flagyl, tramadol, xarelto, metoclopramide, Lactobacillus rhamnosus, famotidine, B12, Astraglus, Vitamin D3  This is a Careers information officer Note.  The care of the patient  was discussed with Dr. Ronalee Red and the assessment and plan was formulated with their assistance.  Please see their note for official documentation of the patient encounter.  Signed Eston Esters, MS3 11/09/2016 3:08 PM

## 2016-11-09 NOTE — H&P (Signed)
Date: 11/09/2016               Patient Name:  Frank Garrett MRN: [id number]  DOB: June 21, 1960 Age / Sex: 56 y.o., male   PCP: Frank Hams, FNP         Medical Service: Internal Medicine Teaching Service         Attending Physician: Dr. Oval Linsey, MD    First Contact: Dr. Ronalee Red Pager: [phone]  Second Contact: Dr. Charlynn Grimes Pager: [phone]       After Hours (After 5p/  First Contact Pager: [phone]  weekends / holidays): Second Contact Pager: [phone]   Chief Complaint: vomiting  History of Present Illness:  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 He had radical distal pancreaticosplenectomy with resection of the celiac axis, repair of SMV, abdominal lymphadenectomy, and cholecystectomy on August 9 at [facility]. He was discharged on 8/13. On 8/15, he developed vomiting, diarrhea, low grade fever, and wound dehiscence. He was C. Diff PCR positive and toxins A & B negative, treated with vanc & flagyl, with resolution of his diarrhea and wound vac placed. He was discharged on August 27. Home health nurse changed wound VAC yesterday which appeared to be healing well with pink granulation tissue. Patient developed nausea and nonbloody, green bilious vomiting yesterday. Denies chest pain, fevers, chills, diarrhea, or abdominal pain.  In the ER, BP 121/96, HR 106, temp 98.1, O2 99% on RA. Labs significant for WBC 11.3, BUN 26, Cr 2.26 (last prior is 1.38 from 02/2016), ALT 64, AP 401. Lipase 111, lactic acid 4.11 -> 1.04. CT abdomen/pelvis demonstrated concern for two loculated areas of fat necrosis. Started on IV zosyn and zofran, with improvement in nausea. General surgery was consulted.  Quit smoking 6 years ago - did smoke ~1-1.5ppd for 30+  years, quit drinking 6 years ago, denies other illicit drugs. No recent changes to his Creon dose.  Meds:  Current Meds  Medication Sig  . Astragalus Extract POWD Take 2 tablets by mouth 2 (two) times daily.  . Cholecalciferol (VITAMIN D3) 5000 units CAPS Take 5,000 Units by mouth daily.  . Cyanocobalamin (VITAMIN B-12 PO) Take 1 tablet by mouth daily.  . famotidine (PEPCID) 20 MG tablet Take 20 mg by mouth 2 (two) times daily.  Marland Kitchen gabapentin (NEURONTIN) 300 MG capsule Take 300 mg by mouth 3 (three) times daily as needed (neuropathy).  Marland Kitchen ibuprofen (ADVIL,MOTRIN) 800 MG tablet Take 800 mg by mouth every 8 (eight) hours as needed for moderate pain.  Marland Kitchen LACTOBACILLUS RHAMNOSUS, GG, PO Take 1 capsule by mouth 2 (two) times daily.  . lipase/protease/amylase (CREON) 36000 UNITS CPEP capsule Take 36,000 Units by mouth 3 (three) times daily before meals.  Marland Kitchen loperamide (IMODIUM) 2 MG capsule Take 2 mg by mouth every 2 (two) hours as needed for diarrhea or loose stools.  . Melatonin 10 MG CAPS Take 10 mg by mouth at bedtime.  . metoCLOPramide (REGLAN) 10 MG tablet Take 10 mg by mouth daily as needed for nausea. Pt takes if Zofran ineffective  . metroNIDAZOLE (FLAGYL) 500 MG tablet Take 500 mg by mouth every 8 (eight) hours.  . Nutritional Supplements (FIRST INTENTION PO) Take 2 capsules by mouth 3 (three) times daily.  . ondansetron (ZOFRAN) 8  MG tablet Take 8 mg by mouth every 8 (eight) hours as needed for nausea or vomiting.  Marland Kitchen OVER THE COUNTER MEDICATION Take 1 drop by mouth 2 (two) times daily. [place] Gold 1200 liquid (1 drop=42.9mg )  . OVER THE COUNTER MEDICATION Take 2-3 capsules by mouth 2 (two) times daily. "Curamed" supplement  . prochlorperazine (COMPAZINE) 10 MG tablet Take 10 mg by mouth 3 (three) times daily as needed for nausea or vomiting.   . rivaroxaban (XARELTO) 20 MG TABS tablet Take 20 mg by mouth daily with supper.  . traMADol (ULTRAM) 50 MG tablet Take 50 mg by mouth every 4 (four)  hours as needed for moderate pain.    Allergies: Allergies as of 11/09/2016 - Review Complete 11/09/2016  Allergen Reaction Noted  . Oxycodone Other (See Comments) 11/09/2016   Past Medical History:  Diagnosis Date  . Cancer ([place]) 04/2016   Pancreatic Adenocarcinoma  . Hypertension    Family History:  History reviewed. No pertinent family history.  Social History:  - with wife and 2 daughters at bedside - started smoking age 6-32 years old, quit 6 years ago - used to smoke 1-1.5ppd - quit drinking 6 years ago - denies other illicit drugs  Review of Systems: A complete ROS was negative except as per HPI.  Physical Exam: Blood pressure 130/81, pulse 74, temperature 98.1 F (36.7 C), temperature source Oral, resp. rate 13, height 5' 9.5" (1.765 m), weight 182 lb 1.6 oz (82.6 kg), SpO2 100 %.  GEN: Well-nourished, AA male lying in bed in NAD; alert and oriented.  EYES: EOMI. Sclera mildly icteric. RESP: Clear to auscultation bilaterally. No wheezes, rales, or rhonchi. No increased work of breathing. CV: Normal rate and regular rhythm. No murmurs, gallops, or rubs. No LE edema. ABD: Soft. Tender to palpation. Non-distended. Normoactive bowel sounds. Wound VAC present over midline abdominal surgical scar. Purulent green/bloody drainage in wound vac container EXT: No edema. 2+ DP pulses. 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 Able to lift all four extremities against gravity.  Labs CBC Latest Ref Rng & Units 11/09/2016 02/17/2016  WBC 4.0 - 10.5 K/uL 11.3(H) 5.9  Hemoglobin 13.0 - 17.0 g/dL 11.6(L) 12.3(L)  Hematocrit 39.0 - 52.0 % 37.2(L) 38.5(L)  Platelets 150 - 400 K/uL 699(H) 259   CMP Latest Ref Rng & Units 11/09/2016 02/17/2016  Glucose 65 - 99 mg/dL 113(H) 101(H)  BUN 6 - 20 mg/dL 26(H) 14  Creatinine 0.61 - 1.24 mg/dL 2.26(H) 1.38(H)  Sodium 135 - 145 mmol/L 141 137  Potassium 3.5 - 5.1 mmol/L 4.2 3.5  Chloride 101 - 111 mmol/L  100(L) 107  CO2 22 - 32 mmol/L 22 23  Calcium 8.9 - 10.3 mg/dL 9.7 9.2  Total Protein 6.5 - 8.1 g/dL 8.6(H) -  Total Bilirubin 0.3 - 1.2 mg/dL 1.2 -  Alkaline Phos 38 - 126 U/L 401(H) -  AST 15 - 41 U/L 27 -  ALT 17 - 63 U/L 64(H) -   Lipase 111 Lactic acid 4.11 -> 1.04  EKG: NSR   KUB & CXR: Negative  CT Abd/pelvis: 1. Postsurgical changes related to distal pancreatectomy and splenectomy. Soft tissue density in the region of the pancreatic head may represent residual pancreas, although this would not be expected after a Whipple procedure. Correlate clinically. 2. Two loculated, organized areas of fluid and fat density in the gastrocolic ligament and along the right aspect of the greater omentum, suspicious for fat necrosis. The sterility  of these collections cannot be assessed by imaging. 3.  Aortic atherosclerosis  Assessment & Plan by Problem: Active Problems:   Abdominal infection [facility])  Mr. 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 CT abdomen/pelvis with 2 loculated areas of organized fluid and fat density in the gastrocolic ligament along the right aspect of the greater omentum suspicious for fat necrosis.  # Vomiting, in setting of recent pancreatic resection 11/13/8180 complicated by wound dehiscence, requiring wound vac placement, and C. diff Patient admitted with 1 day of nonbloody bilious vomiting. No other GI symptoms, including abdominal pain or diarrhea. Afebrile. WBC 11.3, lipase 111. Recently treated for C. Diff with vancomycin. Wound culture from 8/19 from OSH records show Pseudomonas, unsure what treatment he received for this. CT abd/pelvis with 2 loculated areas of organized fluid, suspicious  for fat necrosis. I suspect this is the likely source of his vomiting. General Surgery has evaluated him and will discuss possible IR drainage. Given the acuity, an alternative is gastroenteritis, however patient denies sick contacts or others who have similar symptoms. No diarrhea, nonfebrile. Will continue to monitor. - wound culture - continue IV zosyn - General Surgery consulted; appreciate their assistance --- plan for possible IR drainage --- wet-to-dry dressings - CBC and CMP tomorrow AM - UA - IVF - NPO - PRN zofran  # Pancreatic cancer s/p pancreatic resection 11/20/3714 complicated by would dehiscence and C diff (treated with Vanc) His surgery included splenectomy. Patient is not sure whether he was vaccinated post-op. The records that we received do not make any mention of vaccinations. Will need to further investigate. He is getting IV zosyn, which would provide bacterial coverage for asplenic individuals. Currently afebrile. However, if he does start developing fevers or other symptoms, would consider this - Consider vaccination needs - Hemophilus, Strep pneumo, meningococcus - Continue IV zosyn  # AKI Cr 2.26 (up from 1.38 in 2017). Likely prerenal from excessive vomiting. Will continue to hydrate. - IVF - CMP tomorrow AM  Diet: NPO VTE PPx: lovenox  Dispo: Admit patient to Inpatient with expected length of stay greater than 2 midnights.  Signed: Colbert Ewing, MD  Internal Medicine, PGY-1 11/09/2016, 3:30 PM  P [phone]

## 2016-11-09 NOTE — ED Notes (Signed)
Patient states he is still unable to provide urine specimen.

## 2016-11-09 NOTE — ED Provider Notes (Signed)
[facility] DEPT Provider Note   CSN: [id number] Arrival date & time: 11/09/16  2542     History   Chief Complaint Chief Complaint  Patient presents with  . Emesis    HPI Frank Garrett is a 56 y.o. male with a past medical history of pancreatic cancer. Patient was diagnosed in February of 2018.r. He is under treatment at the [facility] [place] in [place]. Patient has undergone chemotherapy and on 9 August, 2018 underwent a pancreatic resection. His wife states that the next day he began having vomiting and had a wound dehiscence. The patient was kept in the hospital for another week and returned to his home on 27 August, 2018.his wife states that he was doing well. Yesterday he had a home health nurse come out and change his wound VAC which appeared to be well-healing with pink granulation tissue.  Patient began vomiting yesterday he states that the vomitus is green colored he has been unable to hold down any foods or fluids. Like there is something stuck in his upper GI tract and will not go down. The patient's wife has his lab values on the system in her phone.  eHPI  Past Medical History:  Diagnosis Date  . Cancer ([place]) 04/2016   Pancreatic Adenocarcinoma  . Hypertension     Patient Active Problem List   Diagnosis Date Noted  . Abdominal infection ([place]) 11/09/2016    History reviewed. No pertinent surgical history.     Home Medications    Prior to Admission medications   Medication Sig Start Date End Date Taking? Authorizing Provider  Astragalus Extract POWD Take 2 tablets by mouth 2 (two) times daily.   Yes [provider]  Cholecalciferol (VITAMIN D3) 5000 units CAPS Take 5,000 Units by mouth daily.   Yes [provider]  Cyanocobalamin (VITAMIN B-12 PO) Take 1 tablet by mouth daily.   Yes [provider]  famotidine (PEPCID) 20 MG tablet Take 20 mg by mouth 2 (two) times daily.   Yes [provider]  gabapentin  (NEURONTIN) 300 MG capsule Take 300 mg by mouth 3 (three) times daily as needed (neuropathy).   Yes [provider]  ibuprofen (ADVIL,MOTRIN) 800 MG tablet Take 800 mg by mouth every 8 (eight) hours as needed for moderate pain.   Yes [provider]  LACTOBACILLUS RHAMNOSUS, GG, PO Take 1 capsule by mouth 2 (two) times daily.   Yes [provider]  lipase/protease/amylase (CREON) 36000 UNITS CPEP capsule Take 36,000 Units by mouth 3 (three) times daily before meals.   Yes [provider]  loperamide (IMODIUM) 2 MG capsule Take 2 mg by mouth every 2 (two) hours as needed for diarrhea or loose stools.   Yes [provider]  Melatonin 10 MG CAPS Take 10 mg by mouth at bedtime.   Yes [provider]  metoCLOPramide (REGLAN) 10 MG tablet Take 10 mg by mouth daily as needed for nausea. Pt takes if Zofran ineffective 10/23/16  Yes [provider]  metroNIDAZOLE (FLAGYL) 500 MG tablet Take 500 mg by mouth every 8 (eight) hours.   Yes [provider]  Nutritional Supplements (FIRST INTENTION PO) Take 2 capsules by mouth 3 (three) times daily.   Yes [provider]  ondansetron (ZOFRAN) 8 MG tablet Take 8 mg by mouth every 8 (eight) hours as needed for nausea or vomiting.   Yes [provider]  OVER THE COUNTER MEDICATION Take 1 drop by mouth 2 (two)  times daily. [place] Gold 1200 liquid (1 drop=42.9mg )   Yes [provider]  OVER THE COUNTER MEDICATION Take 2-3 capsules by mouth 2 (two) times daily. "Curamed" supplement   Yes [provider]  prochlorperazine (COMPAZINE) 10 MG tablet Take 10 mg by mouth 3 (three) times daily as needed for nausea or vomiting.  06/01/16  Yes [provider]  rivaroxaban (XARELTO) 20 MG TABS tablet Take 20 mg by mouth daily with supper.   Yes [provider]  traMADol (ULTRAM) 50 MG tablet Take 50 mg by mouth every 4 (four) hours as needed for moderate pain.   05/24/16  Yes [provider]  Rivaroxaban 15 & 20 MG TBPK Take as directed on package: Start with one 15mg  tablet by mouth twice a day with food. On Day 22, switch to one 20mg  tablet once a day with food. Patient not taking: Reported on 11/09/2016 07/11/16   Jenny Reichmann, MD    Family History History reviewed. No pertinent family history.  Social History Social History  Substance Use Topics  . Smoking status: Former Smoker    Packs/day: 1.00    Years: 33.00    Quit date: 03/13/2010  . Smokeless tobacco: Never Used  . Alcohol use No     Comment: Former drinker (stopped in 2012)     Allergies   Oxycodone   Review of Systems Review of Systems  Ten systems reviewed and are negative for acute change, except as noted in the HPI.   Physical Exam Updated Vital Signs BP 119/76   Pulse 76   Temp 98.1 F (36.7 C) (Oral)   Resp 14   Ht 5' 9.5" (1.765 m)   Wt 82.6 kg (182 lb 1.6 oz)   SpO2 99%   BMI 26.51 kg/m   Physical Exam  Constitutional: He is oriented to person, place, and time. He appears well-developed. No distress.  Very thin male in no acute distress.  HENT:  Head: Normocephalic and atraumatic.  Dry oral mucosa  Eyes: Pupils are equal, round, and reactive to light. Conjunctivae and EOM are normal.  Mildly icteric  Neck: Normal range of motion. Neck supple.  Cardiovascular: Normal rate, regular rhythm and normal heart sounds.   Pulmonary/Chest: Effort normal and breath sounds normal. No respiratory distress.  Abdominal:  Wound VAC present over midline abdominal surgical scar. There appears to be purulent drainage seeping around the wound VAC  Musculoskeletal: Normal range of motion. He exhibits no edema.  Neurological: He is alert and oriented to person, place, and time.  Skin: Skin is warm and dry. He is not diaphoretic.  Psychiatric: His behavior is normal.  Nursing note and vitals reviewed.    ED Treatments / Results  Labs (all labs ordered are  listed, but only abnormal results are displayed) Labs Reviewed  LIPASE, BLOOD - Abnormal; Notable for the following:       Result Value   Lipase 111 (*)    All other components within normal limits  COMPREHENSIVE METABOLIC PANEL - Abnormal; Notable for the following:    Chloride 100 (*)    Glucose, Bld 113 (*)    BUN 26 (*)    Creatinine, Ser 2.26 (*)    Total Protein 8.6 (*)    ALT 64 (*)    Alkaline Phosphatase 401 (*)    GFR calc non Af Amer 31 (*)    GFR calc Af Amer 36 (*)    Anion gap 19 (*)    All  other components within normal limits  CBC - Abnormal; Notable for the following:    WBC 11.3 (*)    Hemoglobin 11.6 (*)    HCT 37.2 (*)    RDW 16.3 (*)    Platelets 699 (*)    All other components within normal limits  I-STAT CG4 LACTIC ACID, ED - Abnormal; Notable for the following:    Lactic Acid, Venous 4.11 (*)    All other components within normal limits  AEROBIC/ANAEROBIC CULTURE (SURGICAL/DEEP WOUND)  URINALYSIS, ROUTINE W REFLEX MICROSCOPIC  I-STAT CG4 LACTIC ACID, ED    EKG  EKG Interpretation  Date/Time:  Thursday November 09 2016 10:00:57 EDT Ventricular Rate:  82 PR Interval:    QRS Duration: 101 QT Interval:  401 QTC Calculation: 469 R Axis:   24 Text Interpretation:  Sinus rhythm Ventricular premature complex Nonspecific T abnormalities, inferior leads Confirmed by Virgel Manifold [phone]) on 11/09/2016 12:39:11 PM       Radiology Ct Abdomen Pelvis Wo Contrast  Result Date: 11/09/2016 CLINICAL DATA:  Vomiting and nausea. Reported Whipple procedure for pancreatic cancer. EXAM: CT ABDOMEN AND PELVIS WITHOUT CONTRAST TECHNIQUE: Multidetector CT imaging of the abdomen and pelvis was performed following the standard protocol without IV contrast. COMPARISON:  None. FINDINGS: Lower chest: No acute abnormality. Hepatobiliary: No focal liver abnormality is seen. Status post cholecystectomy. No biliary dilatation. Pancreas: Soft tissue density in the region of the  pancreatic head, which may represent the residual pancreatic head, although this would not be expected after Whipple surgery. The distal pancreas is not seen. Spleen: Status post splenectomy. Small amount of fluid in the splenectomy bed extending into the right paracolic gutter. Adrenals/Urinary Tract: Adrenal glands are unremarkable. Kidneys are normal, without renal calculi, focal lesion, or hydronephrosis. Bladder is unremarkable. Stomach/Bowel: Mild wall thickening of the gastric antrum and proximal duodenum, likely reactive. No obstruction. Normal appendix. Vascular/Lymphatic: Aortic atherosclerosis. No enlarged abdominal or pelvic lymph nodes. Reproductive: Prostate is unremarkable. Other: There are two loculated, organized areas of fluid and fat density, one in the gastrocolic ligament measuring approximately 5.1 x 6.4 x 3.6 cm, and the other along the right aspect of the greater omentum, measuring approximately 3.0 x 5.5 x 3.8 cm (AP by transverse by CC). These two areas may communicate (series 6, image 43). Midline abdominal scar. Musculoskeletal: No acute or significant osseous findings. IMPRESSION: Limited evaluation without intravenous contrast. 1. Postsurgical changes related to distal pancreatectomy and splenectomy. Soft tissue density in the region of the pancreatic head may represent residual pancreas, although this would not be expected after a Whipple procedure. Correlate clinically. 2. Two loculated, organized areas of fluid and fat density in the gastrocolic ligament and along the right aspect of the greater omentum, suspicious for fat necrosis. The sterility of these collections cannot be assessed by imaging. 3.  Aortic atherosclerosis (ICD10-I70.0). Electronically Signed   By: Titus Dubin M.D.   On: 11/09/2016 11:44   Dg Abd Acute W/chest  Result Date: 11/09/2016 CLINICAL DATA:  Vomiting since yesterday.  Surgery August 9. EXAM: DG ABDOMEN ACUTE W/ 1V CHEST COMPARISON:  None. FINDINGS:  There is no evidence of dilated bowel loops or free intraperitoneal air. No radiopaque calculi or other significant radiographic abnormality is seen. Heart size and mediastinal contours are within normal limits. Both lungs are clear. IMPRESSION: Negative abdominal radiographs.  No acute cardiopulmonary disease. Electronically Signed   By: Kathreen Devoid   On: 11/09/2016 09:32    Procedures Procedures (including critical care time)  Medications Ordered in ED Medications  piperacillin-tazobactam (ZOSYN) IVPB 3.375 g (not administered)  0.9 %  sodium chloride infusion (not administered)  ondansetron (ZOFRAN) injection 4 mg (not administered)  sodium chloride 0.9 % bolus 2,000 mL (0 mLs Intravenous Stopped 11/09/16 1128)  ondansetron (ZOFRAN) injection 4 mg (4 mg Intravenous Given 11/09/16 1010)  sodium chloride 0.9 % bolus 1,000 mL (0 mLs Intravenous Stopped 11/09/16 1538)  piperacillin-tazobactam (ZOSYN) IVPB 3.375 g (0 g Intravenous Stopped 11/09/16 1431)  ondansetron (ZOFRAN) injection 4 mg (4 mg Intravenous Given 11/09/16 1413)     Initial Impression / Assessment and Plan / ED Course  I have reviewed the triage vital signs and the nursing notes.  Pertinent labs & imaging results that were available during my care of the patient were reviewed by me and considered in my medical decision making (see chart for details).  Clinical Course as of Nov 10 1630  Thu Nov 09, 2016  0076 Creatinine: (!) 2.26 [AH]  2263 Acute kidney injury, last creatinine was 1.03 BUN: (!) 26 [AH]  0953 ALT: (!) 64 [AH]  0953 Patient's liver enzymes have decreased markedly from previous values AST: 27 [AH]  0953 Alkaline phosphatase has increased from 101 to 401. Alkaline Phosphatase: (!) 401 [AH]  0953 Lipase is elevated, unsure of previous value Lipase: (!) 111 [AH]  0954 Hemoglobin improved from previous value of 8.4 post surgical Hemoglobin: (!) 11.6 [AH]    Clinical Course User Index [AH] Margarita Mail, PA-C     Patient with CT scan that shows areas of loculated abscess in the surgical site. I discussed the case with his care team at [facility] [place]. Patient's current records will be faxed here. She does relay that the patient had positive wound cultures for Pseudomonas and is being treated for C. Difficile with Flagyl. Patient was on the antigen positive. Patient will be admitted by the internal medicine resident service, surgery has consult on the patient.   Final Clinical Impressions(s) / ED Diagnoses   Final diagnoses:  Postoperative intra-abdominal abscess, initial encounter ([place])  AKI (acute kidney injury) ([place])  Dehydration  Elevated alkaline phosphatase level  Lactic acidosis    New Prescriptions New Prescriptions   No medications on file     Margarita Mail, PA-C 11/09/16 1632    Virgel Manifold, MD 12/01/16 [phone]

## 2016-11-09 NOTE — Progress Notes (Signed)
Pharmacy Antibiotic Note  Frank Garrett is a 56 y.o. male admitted on 11/09/2016 with wound infection.  Pharmacy has been consulted for zosyn dosing. Pt is afebrile but WBC is slightly elevated at 11.3. SCr is elevated at 2.26. Lactic acid has improved to WNL.   Plan: Zosyn 3.375gm IV Q8H (4 hr inf) F/u renal fxn, C&S, clinical status  Height: 5' 9.5" (176.5 cm) Weight: 182 lb 1.6 oz (82.6 kg) IBW/kg (Calculated) : 71.85  Temp (24hrs), Avg:98.1 F (36.7 C), Min:98.1 F (36.7 C), Max:98.1 F (36.7 C)   Recent Labs Lab 11/09/16 0730 11/09/16 1012 11/09/16 1232  WBC 11.3*  --   --   CREATININE 2.26*  --   --   LATICACIDVEN  --  4.11* 1.04    Estimated Creatinine Clearance: 38 mL/min (A) (by C-G formula based on SCr of 2.26 mg/dL (H)).    Allergies  Allergen Reactions  . Oxycodone Other (See Comments)    Abdominal pain     Antimicrobials this admission: Zosyn 8/30>>  Dose adjustments this admission: N/A  Microbiology results: Pending  Thank you for allowing pharmacy to be a part of this patient's care.  Alliyah Roesler, Rande Lawman 11/09/2016 12:46 PM

## 2016-11-09 NOTE — Consult Note (Signed)
[facility] Nurse wound consult note Reason for Consult:leaking wound vac Wound type: full thickness surgical wound Pressure Injury POA: NA Measurement:na Wound bed:na Drainage (amount, consistency, odor) na Periwound:na Dressing procedure/placement/frequency: Pt is in the ED, unsure of admission or dc home at this time.  Pts wound vac appeared to have a good seal at present but wife states that it was leaking fluid. Not beeping that had an air leak but actually leaking fluid down his abd.  Wound is near umbilicus area.  Pt's wife did not want changed, states just changed yesterday and will be changed by [facility] tomorrow. Barrier ring torn in half and molded to fill in umbilical dip, new drape applied. Told wife that if admitted someone here would be changing the vac MWF as at home. We will not follow, but will remain available to this patient, to nursing, and the medical and/or surgical teams.  Please re-consult if we need to assist further.   Fara Olden, RN-C, WTA-C Wound Treatment Associate

## 2016-11-09 NOTE — Consult Note (Signed)
Reason for Consult:  Sepsis Chief complaint;   nausea and vomiting Referring Physician: DR. Eugenio Hoes  Frank Garrett is an 56 y.o. male.  HPI: Patient is a 56 year old male who was found to have abdominal pain and abnormal CT in February 2018. Reported diffuse pain with eating, 5-10 pound weight loss he had a colonoscopy approximately 1 year ago prior to that in [place]. He underwent EGD and fine-needle aspiration in February by Dr. William Hamburger from [facility]. 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 A few cycles of neoadjuvant adjuvant therapy was recommended, and then see how the tumor responded. No further information on his cancer in and pick till today.  Patient reports he is under treatment at the [facility] in [place]. He underwent chemotherapy on August 18 underwent a pancreatic resection, this was followed by wound dehiscence. He ultimately returned home on 10/2716 and was doing well until yesterday home health nurse change his wound VAC which appeared to be healing with pink granulation tissue. Patient developed nausea and green bilious vomiting unable to hold any food down. He was subsequently brought to the emergency department at [facility].   On exam in the ED he is mildly icteric, wound VAC over the midline surgical scar appears to have purulent drainage seeping around it. He only has 1 temperature recorded and he was afebrile on admission. Blood pressure is 121/96 to a low of 123/77) respiratory rate heart rate are stable labs show a creatinine of 2.26 and alkaline phosphatase 401, lipase 111, AST of 27, ALT of 64, WBC is 11.3, H/H 11.6/37.2. Lactate on admission was 4.11 a repeat 2 hours later is 1.04 CT scan shows postsurgical changes related to a distal pancreatectomy and  splenectomy. Soft tissue density in the region the pancreatic head that may represent residual pancreas although would not be expected after a Whipple procedure. There are 2 loculated areas of organized fluid and fat density in the gastrocolic ligament along the right aspect of the greater omentum suspicious for fat necrosis. Hospital course sequence: Cholecystectomy splenectomy and pancreatic resection 10/19/16. Home 8/15. Readmitted on 8/19 with nausea vomiting and abdominal wound dehiscence. Discharge from the hospital in [place] on 10/2416. Follow-up and then returned home on 10/2716.  We are ask to see.   Past Medical History:  Diagnosis Date  . Pancreatic cancer   . 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 No pertinent surgical history.  History reviewed. No pertinent family history.  Social History:  reports that he has never smoked. He has never used smokeless tobacco. He reports that he does not drink alcohol or use drugs.  Allergies:  Allergies  Allergen Reactions  . Oxycodone Other (See Comments)    Abdominal pain     Prior to Admission medications   Medication Sig Start Date End Date Taking? Authorizing Provider  Astragalus Extract POWD Take 2 tablets by mouth 2 (two) times daily.   Yes [provider]  Cholecalciferol (VITAMIN D3) 5000 units CAPS Take 5,000 Units by mouth daily.   Yes [provider]  Cyanocobalamin (VITAMIN B-12 PO) Take 1 tablet by mouth daily.   Yes [provider]  famotidine (PEPCID) 20 MG tablet Take 20 mg by mouth 2 (two) times daily.  Yes [provider]  gabapentin (NEURONTIN) 300 MG capsule Take 300 mg by mouth 3 (three) times daily as needed (neuropathy).   Yes [provider]  ibuprofen (ADVIL,MOTRIN) 800 MG tablet Take 800 mg by mouth every 8 (eight) hours as needed for moderate pain.   Yes [provider]  LACTOBACILLUS RHAMNOSUS, GG, PO Take 1  capsule by mouth 2 (two) times daily.   Yes [provider]  lipase/protease/amylase (CREON) 36000 UNITS CPEP capsule Take 36,000 Units by mouth 3 (three) times daily before meals.   Yes [provider]  loperamide (IMODIUM) 2 MG capsule Take 2 mg by mouth every 2 (two) hours as needed for diarrhea or loose stools.   Yes [provider]  Melatonin 10 MG CAPS Take 10 mg by mouth at bedtime.   Yes [provider]  metoCLOPramide (REGLAN) 10 MG tablet Take 10 mg by mouth daily as needed for nausea. Pt takes if Zofran ineffective 10/23/16  Yes [provider]  metroNIDAZOLE (FLAGYL) 500 MG tablet Take 500 mg by mouth every 8 (eight) hours.   Yes [provider]  Nutritional Supplements (FIRST INTENTION PO) Take 2 capsules by mouth 3 (three) times daily.   Yes [provider]  ondansetron (ZOFRAN) 8 MG tablet Take 8 mg by mouth every 8 (eight) hours as needed for nausea or vomiting.   Yes [provider]  OVER THE COUNTER MEDICATION Take 1 drop by mouth 2 (two) times daily. [place] Gold 1200 liquid (1 drop=42.19m)   Yes [provider]  OVER THE COUNTER MEDICATION Take 2-3 capsules by mouth 2 (two) times daily. "Curamed" supplement   Yes [provider]  prochlorperazine (COMPAZINE) 10 MG tablet Take 10 mg by mouth 3 (three) times daily as needed for nausea or vomiting.  06/01/16  Yes [provider]  rivaroxaban (XARELTO) 20 MG TABS tablet Take 20 mg by mouth daily with supper.   Yes [provider]  traMADol (ULTRAM) 50 MG tablet Take 50 mg by mouth every 4 (four) hours as needed for moderate pain.  05/24/16  Yes [provider]  Rivaroxaban 15 & 20 MG TBPK Take as directed on package: Start with one 133mtablet by mouth twice a day with food. On Day 22, switch to one 2065mablet once a day with food. Patient not taking: Reported on 11/09/2016 07/11/16   PetJenny ReichmannD     Results for  orders placed or performed during the hospital encounter of 11/09/16 (from the past 48 hour(s))  Lipase, blood     Status: Abnormal   Collection Time: 11/09/16  7:30 AM  Result Value Ref Range   Lipase 111 (H) 11 - 51 U/L  Comprehensive metabolic panel     Status: Abnormal   Collection Time: 11/09/16  7:30 AM  Result Value Ref Range   Sodium 141 135 - 145 mmol/L   Potassium 4.2 3.5 - 5.1 mmol/L   Chloride 100 (L) 101 - 111 mmol/L   CO2 22 22 - 32 mmol/L   Glucose, Bld 113 (H) 65 - 99 mg/dL   BUN 26 (H) 6 - 20 mg/dL   Creatinine, Ser 2.26 (H) 0.61 - 1.24 mg/dL   Calcium 9.7 8.9 - 10.3 mg/dL   Total Protein 8.6 (H) 6.5 - 8.1 g/dL   Albumin 3.6 3.5 - 5.0 g/dL   AST 27 15 - 41 U/L   ALT 64 (H) 17 - 63 U/L   Alkaline Phosphatase 401 (  H) 38 - 126 U/L   Total Bilirubin 1.2 0.3 - 1.2 mg/dL   GFR calc non Af Amer 31 (L) >60 mL/min   GFR calc Af Amer 36 (L) >60 mL/min    Comment: (NOTE) The eGFR has been calculated using the CKD EPI equation. This calculation has not been validated in all clinical situations. eGFR's persistently <60 mL/min signify possible Chronic Kidney Disease.    Anion gap 19 (H) 5 - 15  CBC     Status: Abnormal   Collection Time: 11/09/16  7:30 AM  Result Value Ref Range   WBC 11.3 (H) 4.0 - 10.5 K/uL   RBC 4.35 4.22 - 5.81 MIL/uL   Hemoglobin 11.6 (L) 13.0 - 17.0 g/dL   HCT 37.2 (L) 39.0 - 52.0 %   MCV 85.5 78.0 - 100.0 fL   MCH 26.7 26.0 - 34.0 pg   MCHC 31.2 30.0 - 36.0 g/dL   RDW 16.3 (H) 11.5 - 15.5 %   Platelets 699 (H) 150 - 400 K/uL  I-Stat CG4 Lactic Acid, ED     Status: Abnormal   Collection Time: 11/09/16 10:12 AM  Result Value Ref Range   Lactic Acid, Venous 4.11 (HH) 0.5 - 1.9 mmol/L   Comment NOTIFIED PHYSICIAN   I-Stat CG4 Lactic Acid, ED     Status: None   Collection Time: 11/09/16 12:32 PM  Result Value Ref Range   Lactic Acid, Venous 1.04 0.5 - 1.9 mmol/L    Ct Abdomen Pelvis Wo Contrast  Result Date: 11/09/2016 CLINICAL DATA:   Vomiting and nausea. Reported Whipple procedure for pancreatic cancer. EXAM: CT ABDOMEN AND PELVIS WITHOUT CONTRAST TECHNIQUE: Multidetector CT imaging of the abdomen and pelvis was performed following the standard protocol without IV contrast. COMPARISON:  None. FINDINGS: Lower chest: No acute abnormality. Hepatobiliary: No focal liver abnormality is seen. Status post cholecystectomy. No biliary dilatation. Pancreas: Soft tissue density in the region of the pancreatic head, which may represent the residual pancreatic head, although this would not be expected after Whipple surgery. The distal pancreas is not seen. Spleen: Status post splenectomy. Small amount of fluid in the splenectomy bed extending into the right paracolic gutter. Adrenals/Urinary Tract: Adrenal glands are unremarkable. Kidneys are normal, without renal calculi, focal lesion, or hydronephrosis. Bladder is unremarkable. Stomach/Bowel: Mild wall thickening of the gastric antrum and proximal duodenum, likely reactive. No obstruction. Normal appendix. Vascular/Lymphatic: Aortic atherosclerosis. No enlarged abdominal or pelvic lymph nodes. Reproductive: Prostate is unremarkable. Other: There are two loculated, organized areas of fluid and fat density, one in the gastrocolic ligament measuring approximately 5.1 x 6.4 x 3.6 cm, and the other along the right aspect of the greater omentum, measuring approximately 3.0 x 5.5 x 3.8 cm (AP by transverse by CC). These two areas may communicate (series 6, image 43). Midline abdominal scar. Musculoskeletal: No acute or significant osseous findings. IMPRESSION: Limited evaluation without intravenous contrast. 1. Postsurgical changes related to distal pancreatectomy and splenectomy. Soft tissue density in the region of the pancreatic head may represent residual pancreas, although this would not be expected after a Whipple procedure. Correlate clinically. 2. Two loculated, organized areas of fluid and fat density in  the gastrocolic ligament and along the right aspect of the greater omentum, suspicious for fat necrosis. The sterility of these collections cannot be assessed by imaging. 3.  Aortic atherosclerosis (ICD10-I70.0). Electronically Signed   By: Titus Dubin M.D.   On: 11/09/2016 11:44   Dg Abd Acute W/chest  Result Date:  11/09/2016 CLINICAL DATA:  Vomiting since yesterday.  Surgery August 9. EXAM: DG ABDOMEN ACUTE W/ 1V CHEST COMPARISON:  None. FINDINGS: There is no evidence of dilated bowel loops or free intraperitoneal air. No radiopaque calculi or other significant radiographic abnormality is seen. Heart size and mediastinal contours are within normal limits. Both lungs are clear. IMPRESSION: Negative abdominal radiographs.  No acute cardiopulmonary disease. Electronically Signed   By: Kathreen Devoid   On: 11/09/2016 09:32    Review of Systems  Constitutional: Positive for fever, malaise/fatigue and weight loss. Negative for chills and diaphoresis.  HENT: Negative.   Eyes: Negative.   Respiratory: Negative.   Cardiovascular: Negative.   Gastrointestinal: Positive for diarrhea (none the last 24 hours.  ) and heartburn. Negative for blood in stool, constipation and melena.       He was tested in [place] last admit for C diffl + antigen and negative for the toxin.   Genitourinary:       He quit voiding today  Musculoskeletal: Negative.   Skin: Negative.   Neurological: Negative.  Negative for weakness.  Endo/Heme/Allergies: Bruises/bleeds easily (On Xarelto).  Psychiatric/Behavioral: Negative.    Blood pressure 113/81, pulse 79, temperature 98.1 F (36.7 C), temperature source Oral, resp. rate 13, height 5' 9.5" (1.765 m), weight 82.6 kg (182 lb 1.6 oz), SpO2 97 %. Physical Exam  Constitutional: He is oriented to person, place, and time. He appears well-developed and well-nourished. No distress.   Temp 99.1 when I saw him but he feel warmer.  HENT:  Head: Normocephalic and atraumatic.    Mouth/Throat: No oropharyngeal exudate.  Eyes: Right eye exhibits no discharge. Left eye exhibits no discharge. No scleral icterus.  Pupils are equal  Neck: Normal range of motion. Neck supple. No JVD present. No tracheal deviation present. No thyromegaly present.  Cardiovascular: Normal rate, regular rhythm, normal heart sounds and intact distal pulses.   No murmur heard. Respiratory: Effort normal and breath sounds normal. No respiratory distress. He has no wheezes. He has no rales. He exhibits no tenderness.  GI: Soft. He exhibits no distension and no mass. There is no tenderness. There is no rebound and no guarding.  Abdomen has a wound vac in place. He is sore and tender, but not really distended.  Does not have peritonitis. Picture is below, he has some drainage from 2 sites at the base and they are about 4 cm deep.  Cultures are pending.    Musculoskeletal: He exhibits no edema or tenderness.  Lymphadenopathy:    He has no cervical adenopathy.  Neurological: He is alert and oriented to person, place, and time. No cranial nerve deficit.  Skin: Skin is warm and dry. No rash noted. He is not diaphoretic. No erythema. No pallor.  Psychiatric: He has a normal mood and affect. His behavior is normal. Judgment and thought content normal.      Assessment/Plan: Nausea and vomiting starting 11/08/16 Preop chemotherapy for adenocarcinoma of the pancreas Pancreatic adenocarcinoma with gallbladder, spleen and pancreatic resection 10/19/16, [place] dehiscence 10/29/16; repaired and closed in [place]. Discharge 11/03/2017/returned to [facility] 10/2716 Intra-abdominal fluid collections Acute renal failure History of hypertension DVT on chronic anticoagulation - Xarelto  Plan: I've taken wound VAC [place] culture the site of his midline abdominal incision. The 2 sites at the lower portion of the wound are about 4 cm deep. We will review the CT scan and discuss possible IR  drainage with Dr. Hulen Skains. Wet-to-dry dressings open abdominal  wound for now.  Therin Vetsch 11/09/2016, 2:38 PM

## 2016-11-09 NOTE — Progress Notes (Signed)
Frank Garrett [id number] Admission Data: 11/09/2016 5:43 PM Attending Provider: Oval Linsey, MD  [name], [name], FNP Consults/ Treatment Team: Treatment Team:  Ccs, Md, MD  Frank Garrett is a 56 y.o. male patient admitted from ED awake, alert  & orientated  X 4,  Full Code, VSS - Blood pressure 126/75, pulse 67, temperature 99.2 F (37.3 C), temperature source Oral, resp. rate 20, height 5\' 8"  (1.727 m), weight 76.1 kg (167 lb 11.2 oz), SpO2 100 %., O2   RA , no c/o shortness of breath, no c/o chest pain, no distress noted.  IV site WDL:  Left chest port a cath with a transparent dsg that's clean dry and intact.  Allergies:   Allergies  Allergen Reactions  . Oxycodone Other (See Comments)    Abdominal pain      Past Medical History:  Diagnosis Date  . Anemia   . Cancer ([place]) 04/2016   Pancreatic Adenocarcinoma  . GERD (gastroesophageal reflux disease)   . Hypertension     History:  obtained from the patient.   Pt orientation to unit, room and routine. Information packet given to patient/family and safety video watched.  Admission INP armband ID verified with patient/family, and in place. SR up x 2, fall risk assessment complete with Patient and family verbalizing understanding of risks associated with falls. Pt verbalizes an understanding of how to use the call bell and to call for help before getting out of bed.  Skin, clean-dry- intact without evidence of bruising, or skin tears.   No evidence of skin break down noted on exam.     Will cont to monitor and assist as needed.  Frank Ford, RN 11/09/2016 5:43 PM

## 2016-11-09 NOTE — ED Triage Notes (Addendum)
Pt here from home complaint of n/v. Pt is a CA pt, recently had part of his pancreas, spleen, and gall bladder removed on 8/9. Pt alert and oriented. Vitals stable. Reports home nauseas meds are not working.

## 2016-11-10 DIAGNOSIS — Z7901 Long term (current) use of anticoagulants: Secondary | ICD-10-CM

## 2016-11-10 DIAGNOSIS — N179 Acute kidney failure, unspecified: Secondary | ICD-10-CM

## 2016-11-10 DIAGNOSIS — Z87891 Personal history of nicotine dependence: Secondary | ICD-10-CM

## 2016-11-10 DIAGNOSIS — Z90411 Acquired partial absence of pancreas: Secondary | ICD-10-CM

## 2016-11-10 DIAGNOSIS — Z9049 Acquired absence of other specified parts of digestive tract: Secondary | ICD-10-CM

## 2016-11-10 DIAGNOSIS — Z86718 Personal history of other venous thrombosis and embolism: Secondary | ICD-10-CM

## 2016-11-10 DIAGNOSIS — Z8507 Personal history of malignant neoplasm of pancreas: Secondary | ICD-10-CM

## 2016-11-10 DIAGNOSIS — Z9221 Personal history of antineoplastic chemotherapy: Secondary | ICD-10-CM

## 2016-11-10 DIAGNOSIS — R7989 Other specified abnormal findings of blood chemistry: Secondary | ICD-10-CM

## 2016-11-10 DIAGNOSIS — R112 Nausea with vomiting, unspecified: Secondary | ICD-10-CM

## 2016-11-10 DIAGNOSIS — Z9081 Acquired absence of spleen: Secondary | ICD-10-CM

## 2016-11-10 DIAGNOSIS — R188 Other ascites: Secondary | ICD-10-CM

## 2016-11-10 DIAGNOSIS — Z885 Allergy status to narcotic agent status: Secondary | ICD-10-CM

## 2016-11-10 LAB — CBC
HEMATOCRIT: 30.4 % — AB (ref 39.0–52.0)
Hemoglobin: 9.2 g/dL — ABNORMAL LOW (ref 13.0–17.0)
MCH: 26.4 pg (ref 26.0–34.0)
MCHC: 30.3 g/dL (ref 30.0–36.0)
MCV: 87.4 fL (ref 78.0–100.0)
PLATELETS: 535 10*3/uL — AB (ref 150–400)
RBC: 3.48 MIL/uL — ABNORMAL LOW (ref 4.22–5.81)
RDW: 16.8 % — AB (ref 11.5–15.5)
WBC: 6.1 10*3/uL (ref 4.0–10.5)

## 2016-11-10 LAB — COMPREHENSIVE METABOLIC PANEL
ALBUMIN: 2.9 g/dL — AB (ref 3.5–5.0)
ALK PHOS: 258 U/L — AB (ref 38–126)
ALT: 39 U/L (ref 17–63)
AST: 21 U/L (ref 15–41)
Anion gap: 8 (ref 5–15)
BILIRUBIN TOTAL: 0.6 mg/dL (ref 0.3–1.2)
BUN: 16 mg/dL (ref 6–20)
CO2: 26 mmol/L (ref 22–32)
CREATININE: 1.4 mg/dL — AB (ref 0.61–1.24)
Calcium: 8.8 mg/dL — ABNORMAL LOW (ref 8.9–10.3)
Chloride: 109 mmol/L (ref 101–111)
GFR calc Af Amer: >60 mL/min (ref >60)
GFR, EST NON AFRICAN AMERICAN: 56 mL/min — AB (ref >60)
Glucose, Bld: 169 mg/dL — ABNORMAL HIGH (ref 65–99)
POTASSIUM: 3.1 mmol/L — AB (ref 3.5–5.1)
Sodium: 143 mmol/L (ref 135–145)
TOTAL PROTEIN: 6.8 g/dL (ref 6.5–8.1)

## 2016-11-10 LAB — HIV ANTIBODY (ROUTINE TESTING W REFLEX): HIV Screen 4th Generation wRfx: NONREACTIVE

## 2016-11-10 MED ORDER — SODIUM CHLORIDE 0.9% FLUSH
10.0000 mL | INTRAVENOUS | Status: DC | PRN
  Administered 2016-11-11: 10 mL
  Filled 2016-11-10: qty 40

## 2016-11-10 MED ORDER — ENSURE ENLIVE PO LIQD
237.0000 mL | Freq: Three times a day (TID) | ORAL | Status: DC
  Administered 2016-11-11: 237 mL via ORAL

## 2016-11-10 MED ORDER — LOPERAMIDE HCL 2 MG PO CAPS
2.0000 mg | ORAL_CAPSULE | ORAL | Status: DC | PRN
  Administered 2016-11-10 – 2016-11-11 (×4): 2 mg via ORAL
  Filled 2016-11-10 (×4): qty 1

## 2016-11-10 MED ORDER — BOOST / RESOURCE BREEZE PO LIQD
1.0000 | Freq: Three times a day (TID) | ORAL | Status: AC
  Administered 2016-11-10: 1 via ORAL

## 2016-11-10 MED ORDER — PANCRELIPASE (LIP-PROT-AMYL) 12000-38000 UNITS PO CPEP
36000.0000 [IU] | ORAL_CAPSULE | Freq: Three times a day (TID) | ORAL | Status: DC
  Administered 2016-11-11 (×2): 36000 [IU] via ORAL
  Filled 2016-11-10 (×3): qty 3

## 2016-11-10 NOTE — Progress Notes (Signed)
Subjective:  [name] is doing well this morning. He had broth and italian ice yesterday evening. Denies N/V yesterday and currently. Did have diarrhea this morning. Denies abdominal pain or fevers.  He denies any recent changes to his medications. States that the evening prior to presentation, he ate grilled chicken at [facility], but his wife also ate the same thing and she does not have the same symptoms.  Surgery saw him this morning and feel that the imaging is less likely to be infectious. No surgical intervention for now.  Objective:  Vital signs in last 24 hours: Vitals:   11/09/16 1615 11/09/16 1656 11/09/16 2138 11/10/16 0525  BP: 119/76 126/75 113/74 126/77  Pulse: 76 67 64 64  Resp: 14 20 16 18   Temp:  99.2 F (37.3 C) 99.1 F (37.3 C) 98.2 F (36.8 C)  TempSrc:  Oral Oral Oral  SpO2: 99% 100% 100% 100%  Weight:  167 lb 11.2 oz (76.1 kg)    Height:  5\' 8"  (1.727 m)     GEN: Pleasant male lying in bed in NAD; Alert and oriented RESP: Clear to auscultation bilaterally. No wheezes, rales, or rhonchi. No increased work of breathing. CV: Normal rate and regular rhythm. No murmurs, gallops, or rubs. No LE edema. ABD: Soft. Non-tender to palpation. Midline surgical scar covered with dry abdominal dressing. Non-distended. Normoactive bowel sounds. EXT: No edema. 2+ DP pulses. NEURO: Cranial nerves II-XII grossly intact. Able to lift all four extremities against gravity.  Labs Wound culture, HIV pending  Assessment/Plan:  Active Problems:   Abdominal infection [facility])  [name] is a 56yo male with PMH significant for pancreatic cancer (diagnosed Feb 2018, s/p 8 cycles of chemo + distal pancreatic resection, cholecystectomy, and splenectomy 10/19/2016) and hx of DVT (on chronic anticoag with Xarelto) who presents with 1 day of nonbloody green bilious vomiting with no abdominal pain or fevers. N/V now resolved. Tolerating clear liquid diet. General surgery has discussed imaging  with Radiology and do not feel that the fluid collections on CT are infectious, not planning for surgical intervention at this time.  # Vomiting, in setting of recent pancreatic resection 05/16/1441 complicated by wound dehiscence, requiring wound vac placement, and C. diff Patient admitted with 1 day of nonbloody bilious vomiting. N/V improved this morning. Patient is tolerating clear liquid diet. He wants to continue CLD for this morning and then consider soft foods for lunch. Remains afebrile. I am still unsure as to the etiology of his vomiting - whether it is related to his extensive abdominal surgery with alterations in his anatomy vs an acute gastroenteritis. Less likely is a medication-induced vomiting due to the lack of changes in his medications recently. Regardless, his vomiting is improved and Radiology/Gen Surg feel that the fluid collections are not infectious. We will continue to monitor and advance his diet as tolerated. [ ]  f/u wound culture [ ]  f/u CBC & CMP - continue IV zosyn - can consider discontinuing abx if not concerned about infectious etiology - General Surgery consulted; appreciate their assistance - IVF - Advance diet as tolerated - PRN zofran & phenergan  # Pancreatic cancer s/p pancreatic resection 03/17/4006 complicated by would dehiscence and C diff (treated with Vanc) His surgery included splenectomy. Patient is not sure whether he was vaccinated post-op. The records that we received do not make any mention of vaccinations. He is getting IV zosyn, which would provide bacterial coverage for asplenic individuals. Currently afebrile. However, if he does start  developing fevers or other symptoms, would consider this - Consider vaccination needs - Hemophilus, Strep pneumo, meningococcus - Continue IV zosyn for now  # AKI Cr 2.26 (up from 1.38 in 2017). Likely prerenal from dehydration 2/2 vomiting. Will continue to hydrate. IVF currently at 125ml/hr. Patient states that  he is thirsty. Will consider increasing IVF depending on his morning CMP. - Continue IVF 125 ml/hr - CMP tomorrow AM  Diet: CLD - advance diet as tolerated VTE PPx: lovenox  Dispo: Anticipated discharge in approximately 1-2 day(s).   Colbert Ewing, MD  Internal Medicine, PGY-1 11/10/2016, 11:35 AM Pager: Mamie Nick [phone]

## 2016-11-10 NOTE — Progress Notes (Signed)
Initial Nutrition Assessment  DOCUMENTATION CODES:   Not applicable  INTERVENTION:   -Boost Breeze po TID, each supplement provides 250 kcal and 9 grams of protein -Once diet advanced: Ensure Enlive po TID, each supplement provides 350 kcal and 20 grams of protein  NUTRITION DIAGNOSIS:   Increased nutrient needs related to cancer and cancer related treatments as evidenced by estimated needs.  GOAL:   Patient will meet greater than or equal to 90% of their needs  MONITOR:   PO intake, Supplement acceptance, Diet advancement, Labs, Weight trends, Skin, I & O's  REASON FOR ASSESSMENT:   Malnutrition Screening Tool    ASSESSMENT:   Frank Garrett is a 56yo male with PMH significant for pancreatic cancer (diagnosed Feb 2018, s/p 8 cycles of chemo + pancreatic resection, cholecystectomy, and splenectomy 10/19/2016) and hx of DVT (on chronic anticoag with Xarelto) who presents with 1 day of nonbloody green bilious vomiting with no abdominal pain or fevers. CT abdomen/pelvis with 2 loculated areas of organized fluid and fat density in the gastrocolic ligament along the right aspect of the greater omentum suspicious for fat necrosis.  Pt admitted with abdominal infection.   8/30-wound vac removed  Surgical PA performed dressing change just prior to RD assessment.   Spoke with pt, who reports improving appetite up until 2-3 days PTA, due to nausea and vomiting. Pt reports he had been making good progress with his diet over the past 3 weeks; had been consuming 2 meals per day, working his way up to 6 small meals per day per the advice of his home health nurse. Typical meals include items such as grits and eggs. Pt was also consuming 1-2 Ensure Original supplements.   Pt reports UBW is around 180#, however, weight does fluctuate at baseline. Noted pt has experienced a 15.6% wt loss over the past 9 months, which is significant, however, pt believes wt loss is multifactorial, as he started going  to the gym and making healthier food choices prior to receiving cancer diagnosis. He estimates he has lost approximately 20# within the past month related to recent whipple procedure, however, no wt hx available to confirm this statement.   Discussed importance of good meal and supplement intake to promote healing. Pt tolerating clear liquids well at this time, reporting he requested clear liquids with potential advancement to solid foods later on today. Pt amenable to continue supplements, which RD will order.   Nutrition-Focused physical exam completed. Findings are no fat depletion, no muscle depletion, and no edema.   Labs reviewed.   Diet Order:  Diet clear liquid Room service appropriate? Yes; Fluid consistency: Thin  Skin:  Reviewed, no issues  Last BM:  11/10/16  Height:   Ht Readings from Last 1 Encounters:  11/09/16 5\' 8"  (1.727 m)    Weight:   Wt Readings from Last 1 Encounters:  11/09/16 167 lb 11.2 oz (76.1 kg)    Ideal Body Weight:  70 kg  BMI:  Body mass index is 25.5 kg/m.  Estimated Nutritional Needs:   Kcal:  2200-2400  Protein:  115-130 grams  Fluid:  > 2.0 L  EDUCATION NEEDS:   Education needs addressed  Auguste Tebbetts A. Jimmye Norman, RD, LDN, CDE Pager: [phone] After hours Pager: [phone]

## 2016-11-10 NOTE — Progress Notes (Signed)
Pharmacy Antibiotic Note Frank Garrett is a 56 y.o. male admitted on 11/09/2016 due to concern for wound infection following pancreatic resection with subsequent wound dehiscence. Pharmacy following for Zosyn.   Plan: Continue Zosyn 3.375gm IV Q8H (4 hr inf)   Height: 5\' 8"  (172.7 cm) Weight: 167 lb 11.2 oz (76.1 kg) IBW/kg (Calculated) : 68.4  Temp (24hrs), Avg:98.8 F (37.1 C), Min:98.2 F (36.8 C), Max:99.2 F (37.3 C)   Recent Labs Lab 11/09/16 0730 11/09/16 1012 11/09/16 1232  WBC 11.3*  --   --   CREATININE 2.26*  --   --   LATICACIDVEN  --  4.11* 1.04    Estimated Creatinine Clearance: 36.2 mL/min (A) (by C-G formula based on SCr of 2.26 mg/dL (H)).    Allergies  Allergen Reactions  . Oxycodone Other (See Comments)    Abdominal pain     Antimicrobials this admission: Zosyn 8/30>>  Microbiology results: 8/30 wound cx: px  Thank you for allowing pharmacy to be a part of this patient's care.  Vincenza Hews, PharmD, BCPS 11/10/2016, 10:29 AM

## 2016-11-10 NOTE — Discharge Summary (Signed)
Name: Frank Garrett MRN: [id number] DOB: 1960-12-05 56 y.o. PCP: Gregor Hams, FNP  Date of Admission: 11/09/2016  7:32 AM Date of Discharge: 11/11/2016 Attending Physician: Oval Linsey, MD  Discharge Diagnosis: 1. Nausea and vomiting 2. AKI  Discharge Medications: Allergies as of 11/11/2016      Reactions   Oxycodone Other (See Comments)   Abdominal pain      Medication List    STOP taking these medications   metroNIDAZOLE 500 MG tablet Commonly known as:  FLAGYL     TAKE these medications   Astragalus Extract Powd Take 2 tablets by mouth 2 (two) times daily.   famotidine 20 MG tablet Commonly known as:  PEPCID Take 20 mg by mouth 2 (two) times daily.   FIRST INTENTION PO Take 2 capsules by mouth 3 (three) times daily.   gabapentin 300 MG capsule Commonly known as:  NEURONTIN Take 300 mg by mouth 3 (three) times daily as needed (neuropathy).   ibuprofen 800 MG tablet Commonly known as:  ADVIL,MOTRIN Take 800 mg by mouth every 8 (eight) hours as needed for moderate pain.   LACTOBACILLUS RHAMNOSUS (GG) PO Take 1 capsule by mouth 2 (two) times daily.   lipase/protease/amylase 36000 UNITS Cpep capsule Commonly known as:  CREON Take 36,000 Units by mouth 3 (three) times daily before meals.   loperamide 2 MG capsule Commonly known as:  IMODIUM Take 2 mg by mouth every 2 (two) hours as needed for diarrhea or loose stools.   Melatonin 10 MG Caps Take 10 mg by mouth at bedtime.   metoCLOPramide 10 MG tablet Commonly known as:  REGLAN Take 10 mg by mouth daily as needed for nausea. Pt takes if Zofran ineffective   ondansetron 8 MG tablet Commonly known as:  ZOFRAN Take 8 mg by mouth every 8 (eight) hours as needed for nausea or vomiting.   OVER THE COUNTER MEDICATION Take 1 drop by mouth 2 (two) times daily. [place] Gold 1200 liquid (1 drop=42.9mg )   OVER THE COUNTER MEDICATION Take 2-3 capsules by mouth 2 (two) times daily. "Curamed" supplement     prochlorperazine 10 MG tablet Commonly known as:  COMPAZINE Take 10 mg by mouth 3 (three) times daily as needed for nausea or vomiting.   rivaroxaban 20 MG Tabs tablet Commonly known as:  XARELTO Take 20 mg by mouth daily with supper. What changed:  Another medication with the same name was removed. Continue taking this medication, and follow the directions you see here.   traMADol 50 MG tablet Commonly known as:  ULTRAM Take 50 mg by mouth every 4 (four) hours as needed for moderate pain.   VITAMIN B-12 PO Take 1 tablet by mouth daily.   Vitamin D3 5000 units Caps Take 5,000 Units by mouth daily.            Discharge Care Instructions        Start     Ordered   11/11/16 0000  Increase activity slowly     11/11/16 1225   11/11/16 0000  Diet - low sodium heart healthy     11/11/16 1225   11/11/16 0000  Discharge instructions    Comments:  Follow up with PCP and Primary Surgeons outpatient. No obvious infection seen during admission. Acute Kidney Injury was secondary to dehydration and resolved with IV fluids.   11/11/16 1225   11/11/16 0000  Call MD for:  temperature >100.4     11/11/16 1225   11/11/16 0000  Call MD for:  persistant nausea and vomiting     11/11/16 1225   11/11/16 0000  Call MD for:  severe uncontrolled pain     11/11/16 1225   11/11/16 0000  Call MD for:  redness, tenderness, or signs of infection (pain, swelling, redness, odor or green/yellow discharge around incision site)     11/11/16 1225   11/11/16 [place]  (Home health needs / face to face )    Comments:  Home Health to resume nursing for Wound Vac 3X weekly.  Question:  To provide the following care/treatments  Answer:  RN   11/11/16 1414   11/11/16 0000  Face-to-face encounter (required for Medicare/Medicaid patients)  (Home health needs / face to face )    Comments:  I Zada Finders certify that this patient is under my care and that I, or a nurse practitioner or physician's  assistant working with me, had a face-to-face encounter that meets the physician face-to-face encounter requirements with this patient on 11/11/2016. The encounter with the patient was in whole, or in part for the following medical condition(s) which is the primary reason for home health care (List medical condition): Post-surgical Abdominal wound with wound vac.  Question Answer Comment  The encounter with the patient was in whole, or in part, for the following medical condition, which is the primary reason for home health care Abdominal wound   I certify that, based on my findings, the following services are medically necessary home health services Nursing   Reason for Medically [place]   My clinical findings support the need for the above services OTHER SEE COMMENTS   Further, I certify that my clinical findings support that this patient is homebound due to: Pain interferes with ambulation/mobility      11/11/16 1414      Disposition and follow-up:   Frank Garrett was discharged from [facility] in Good condition.  At the hospital follow up visit please address:  1.  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 - Patient had a splenectomy. Did he have pre-op vaccinations? If not, will need post-op vaccinations for asplenic individuals. If undergoing further chemotherapy, timing of these vaccines will be delayed. Will need continued wound care, home health nursing face to face ordered on discharge.  2.  Nausea and vomiting Patient had one day of vomiting. Unsure of the etiology, but possibly gastroenteritis vs postsurgical complications. N/V improved the next day and was able to tolerate food. Is he still feeling nauseous or  vomiting? Any diarrhea?  3.  AKI Cr increased to 2.26 (up from 1.38 in 2017). Likely prerenal from dehydration 2/2 vomiting. Cr down to 1.16 with IV fluids. Can re-check Cr if appropriate.  4.  Labs / imaging needed at time of follow-up: BMP for Cr  5.  Pending labs/ test needing follow-up: none  Follow-up Appointments: Follow-up Information    Gregor Hams, FNP. Schedule an appointment as soon as possible for a visit in 2 week(s).   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  1. Nausea and vomiting, unknown etiology DDx: gastroenteritis vs postsurgical complications Patient admitted on 8/30 with 1 day of nonbloody bilious vomiting. No other GI symptoms, including abdominal pain or diarrhea. Patient was afebrile. WBC 11.3, lipase 111. Wound culture from 8/19 from OSH records show Pseudomonas on 8/19, unsure what treatment he received for this. Did receive vancomycin for C. Diff at OSH. CT abd/pelvis with 2 loculated areas of organized fluid, which was discussed with General Surgery and Radiology, which were not felt to be infectious in etiology. Patient did receive 2 days of IV zosyn. Superficial wound culture showed few Pseudomonas. This was not felt to be active infection and no further antibiotics were given. The next morning, patient states that his N/V had improved, able to tolerate clear liquids. Diet was advanced to regular, tolerated well, and patient was discharged on  11/11/16.  2. 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 Post-op course complicated by vomiting and wound dehiscence, requiring wound vac placement, as well as C. Diff, treated with vancomycin. Because his surgery included a splenectomy, patient will need recommended vaccinations for asplenic individuals. Patient does not know whether he got any vaccinations prior to the surgery. If he did not receive vaccinations pre-op, he will need them post-op. However, he also may be undergoing further chemotherapy after his surgery, which would delay the timing of his post-op vaccinations.  3. AKI, likely secondary to prerenal azotemia in setting of vomiting Cr 2.26, up from 1.38 in 2017. Likely from dehydration in setting of vomiting. UA neg. Patient denied dysuria or hematuria. Cr improved to 1.16.  Discharge Vitals:   BP 115/67 (BP Location: Right Arm)   Pulse 64   Temp 98.6 F (37 C) (Oral)   Resp 18   Ht 5\' 8"  (1.727 m)   Wt 167 lb 11.2 oz (76.1 kg)   SpO2 100%   BMI 25.50 kg/m   Pertinent Labs, Studies, and Procedures:  CBC Latest Ref Rng & Units 11/11/2016 11/10/2016 11/09/2016  WBC 4.0 - 10.5 K/uL 6.2 6.1 11.3(H)  Hemoglobin 13.0 - 17.0 g/dL 8.5(L) 9.2(L) 11.6(L)  Hematocrit 39.0 - 52.0 % 28.2(L) 30.4(L) 37.2(L)  Platelets 150 - 400 K/uL 501(H) 535(H) 699(H)   CMP Latest Ref Rng & Units 11/11/2016 11/10/2016 11/09/2016  Glucose 65 - 99 mg/dL 104(H) 169(H) 113(H)  BUN 6 - 20 mg/dL 10 16 26(H)  Creatinine 0.61 - 1.24 mg/dL 1.16 1.40(H) 2.26(H)  Sodium 135 - 145 mmol/L 143 143 141  Potassium 3.5 - 5.1 mmol/L 3.4(L) 3.1(L) 4.2  Chloride 101 - 111 mmol/L 110 109 100(L)  CO2 22 - 32 mmol/L 24 26 22   Calcium 8.9 - 10.3 mg/dL 8.7(L) 8.8(L) 9.7  Total Protein 6.5 - 8.1 g/dL - 6.8 8.6(H)  Total Bilirubin 0.3 -  1.2 mg/dL - 0.6 1.2  Alkaline Phos 38 - 126 U/L - 258(H) 401(H)  AST 15 - 41 U/L - 21 27  ALT 17 - 63 U/L - 39 64(H)   Lactic acid 4.11 -> 1.04 Lipase 111   CT abd/pelvis wo contrast 8/30 1. Postsurgical  changes related to distal pancreatectomy and splenectomy. Soft tissue density in the region of the pancreatic head may represent residual pancreas, although this would not be expected after a Whipple procedure. Correlate clinically. 2. Two loculated, organized areas of fluid and fat density in the gastrocolic ligament and along the right aspect of the greater omentum, suspicious for fat necrosis. The sterility of these collections cannot be assessed by imaging. 3.  Aortic atherosclerosis  Discharge Instructions: Discharge Instructions    Call MD for:  persistant nausea and vomiting    Complete by:  As directed    Call MD for:  redness, tenderness, or signs of infection (pain, swelling, redness, odor or green/yellow discharge around incision site)    Complete by:  As directed    Call MD for:  severe uncontrolled pain    Complete by:  As directed    Call MD for:  temperature >100.4    Complete by:  As directed    Diet - low sodium heart healthy    Complete by:  As directed    Discharge instructions    Complete by:  As directed    Follow up with PCP and Primary Surgeons outpatient. No obvious infection seen during admission. Acute Kidney Injury was secondary to dehydration and resolved with IV fluids.   Face-to-face encounter (required for Medicare/Medicaid patients)    Complete by:  As directed    I Zada Finders certify that this patient is under my care and that I, or a nurse practitioner or physician's assistant working with me, had a face-to-face encounter that meets the physician face-to-face encounter requirements with this patient on 11/11/2016. The encounter with the patient was in whole, or in part for the following medical condition(s) which is the primary reason for home health  care (List medical condition): Post-surgical Abdominal wound with wound vac.   The encounter with the patient was in whole, or in part, for the following medical condition, which is the primary reason for home health care:  Abdominal wound   I certify that, based on my findings, the following services are medically necessary home health services:  Nursing   Reason for Medically Necessary Home Health Services:  Skilled Nursing- Complex Wound Care   My clinical findings support the need for the above services:  OTHER SEE COMMENTS   Further, I certify that my clinical findings support that this patient is homebound due to:  Pain interferes with ambulation/mobility   Home Health    Complete by:  As directed    Home Health to resume nursing for Wound Vac 3X weekly.   To provide the following care/treatments:  RN   Increase activity slowly    Complete by:  As directed       Signed: Zada Finders, MD 11/11/2016, 2:16 PM

## 2016-11-10 NOTE — Progress Notes (Signed)
CC:  Nausea and vomiting  Subjective: He feels much better this AM. No further nausea or vomiting.  He is on a clear diet. I'm told they are waiting on IV team to pull labs from [place].  Objective: Vital signs in last 24 hours: Temp:  [98.2 F (36.8 C)-99.2 F (37.3 C)] 98.2 F (36.8 C) (08/31 0525) Pulse Rate:  [64-82] 64 (08/31 0525) Resp:  [13-25] 18 (08/31 0525) BP: (113-142)/(74-96) 126/77 (08/31 0525) SpO2:  [97 %-100 %] 100 % (08/31 0525) Weight:  [76.1 kg (167 lb 11.2 oz)-82.6 kg (182 lb 1.6 oz)] 76.1 kg (167 lb 11.2 oz) (08/30 1656) Last BM Date: 11/10/16 NPO 2533 IV fluids No output recorded. Afebrile, VSS No labs this AM.  Lactate down to 1.04 12:30 AM CT reviewed with radiology- Dr. Hulen Skains does not think this is abscess Intake/Output from previous day: 08/30 0701 - 08/31 0700 In: 2533.3 [I.V.:1433.3; IV Piggyback:1100] Out: -  Intake/Output this shift: No intake/output data recorded.  General appearance: alert, cooperative and no distress GI: soft, not distended or tender.  Open wound still has drainage coming from it.  If you push a Q tip in on you can get some movement of the fluid thru the other.  I didn't open but did pack both with ope end of 4 x4. Redressed wet to dry.   Lab Results:   Recent Labs  11/09/16 0730  WBC 11.3*  HGB 11.6*  HCT 37.2*  PLT 699*    BMET  Recent Labs  11/09/16 0730  NA 141  K 4.2  CL 100*  CO2 22  GLUCOSE 113*  BUN 26*  CREATININE 2.26*  CALCIUM 9.7   PT/INR No results for input(s): LABPROT, INR in the last 72 hours.   Recent Labs Lab 11/09/16 0730  AST 27  ALT 64*  ALKPHOS 401*  BILITOT 1.2  PROT 8.6*  ALBUMIN 3.6     Lipase     Component Value Date/Time   LIPASE 111 (H) 11/09/2016 0730     Medications: . enoxaparin (LOVENOX) injection  40 mg Subcutaneous Q24H   Specimen Description WOUND ABDOMEN   Special Requests NONE   Gram Stain FEW WBC PRESENT,BOTH PMN AND MONONUCLEAR  MODERATE  GRAM NEGATIVE RODS     . sodium chloride 125 mL/hr at 11/10/16 0300  . 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 Discharge 11/03/2017/returned to [facility] 10/2716 Intra-abdominal fluid collections Acute renal failure History of hypertension DVT on chronic anticoagulation - Xarelto FEN:  IV fluids/clear ID:  Zosyn 11/09/16 =>> day 2 DVT:  Lovenox  Plan:  He seems better.  I will order labs. Dr. Hulen Skains reviewed the CT with Radiology and does not think the fluid collections on CT are infectious.  No drain right now.  I did pack open 4 x 4 into each  small open site lower abdominal wound.       LOS: 1 day    Lyndon Chapel 11/10/2016 [phone]

## 2016-11-11 DIAGNOSIS — N179 Acute kidney failure, unspecified: Secondary | ICD-10-CM

## 2016-11-11 LAB — CBC
HCT: 28.2 % — ABNORMAL LOW (ref 39.0–52.0)
Hemoglobin: 8.5 g/dL — ABNORMAL LOW (ref 13.0–17.0)
MCH: 26.2 pg (ref 26.0–34.0)
MCHC: 30.1 g/dL (ref 30.0–36.0)
MCV: 86.8 fL (ref 78.0–100.0)
PLATELETS: 501 10*3/uL — AB (ref 150–400)
RBC: 3.25 MIL/uL — ABNORMAL LOW (ref 4.22–5.81)
RDW: 16.9 % — ABNORMAL HIGH (ref 11.5–15.5)
WBC: 6.2 10*3/uL (ref 4.0–10.5)

## 2016-11-11 LAB — BASIC METABOLIC PANEL
Anion gap: 9 (ref 5–15)
BUN: 10 mg/dL (ref 6–20)
CALCIUM: 8.7 mg/dL — AB (ref 8.9–10.3)
CO2: 24 mmol/L (ref 22–32)
CREATININE: 1.16 mg/dL (ref 0.61–1.24)
Chloride: 110 mmol/L (ref 101–111)
GFR calc non Af Amer: >60 mL/min (ref >60)
Glucose, Bld: 104 mg/dL — ABNORMAL HIGH (ref 65–99)
Potassium: 3.4 mmol/L — ABNORMAL LOW (ref 3.5–5.1)
SODIUM: 143 mmol/L (ref 135–145)

## 2016-11-11 MED ORDER — TRAMADOL HCL 50 MG PO TABS
50.0000 mg | ORAL_TABLET | ORAL | Status: DC | PRN
Start: 2016-11-11 — End: 2016-11-11

## 2016-11-11 MED ORDER — GABAPENTIN 300 MG PO CAPS: 300.0000 mg | ORAL_CAPSULE | Freq: Three times a day (TID) | ORAL | Status: DC | PRN

## 2016-11-11 MED ORDER — POTASSIUM CHLORIDE 20 MEQ/15ML (10%) PO SOLN
40.0000 meq | Freq: Once | ORAL | Status: AC
  Administered 2016-11-11: 40 meq via ORAL
  Filled 2016-11-11: qty 30

## 2016-11-11 MED ORDER — ACETAMINOPHEN 325 MG PO TABS
650.0000 mg | ORAL_TABLET | Freq: Four times a day (QID) | ORAL | Status: DC | PRN
  Administered 2016-11-11: 650 mg via ORAL
  Filled 2016-11-11: qty 2

## 2016-11-11 MED ORDER — RIVAROXABAN 20 MG PO TABS: 20.0000 mg | ORAL_TABLET | Freq: Every day | ORAL | Status: DC

## 2016-11-11 MED ORDER — HEPARIN SOD (PORK) LOCK FLUSH 100 UNIT/ML IV SOLN
500.0000 [IU] | INTRAVENOUS | Status: AC | PRN
  Administered 2016-11-11: 500 [IU]

## 2016-11-11 NOTE — Progress Notes (Signed)
Subjective:  Patient feels well this morning. He says he had a few loose stools yesterday. He received Imodium which has resolved his loose stools. He has not had a BM this morning. He denies any further nausea and vomiting. There was a miscommunication and he was unable to advance his diet from clears yesterday. He wants to try solids this morning. He denies any fevers or chills. He is asking if his wound vac will be placed back on prior to discharge.  Objective:  Vital signs in last 24 hours: Vitals:   11/10/16 0525 11/10/16 1501 11/10/16 2155 11/11/16 0559  BP: 126/77 123/73 116/71 115/67  Pulse: 64 (!) 57 (!) 57 64  Resp: 18 17 18 18   Temp: 98.2 F (36.8 C) 98.6 F (37 C) 99.2 F (37.3 C) 98.6 F (37 C)  TempSrc: Oral Oral Oral Oral  SpO2: 100% 100% 100% 100%  Weight:      Height:       General: resting in bed, no acute distress Cardiac: RRR, no rubs, murmurs or gallops Pulm: clear to auscultation bilaterally, moving normal volumes of air Abd: soft, nontender, dry surgical dressing in place Ext: warm and well perfused, no pedal edema Neuro: alert and oriented X3   Assessment/Plan:  Active Problems:   Nausea and vomiting   AKI (acute kidney injury) [facility])  Mr. 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 N/V now resolved. Tolerating clear liquid diet. General surgery has discussed imaging with Radiology and do not feel that the fluid collections on CT are infectious, not planning for surgical intervention at this time.  # Vomiting, in setting of recent pancreatic resection 4/0/9735 complicated by wound dehiscence, requiring wound vac placement, and C. diff Patient admitted with 1 day of nonbloody  bilious vomiting. N/V now resolved. Patient tolerated clear liquid diet. Remains afebrile. Unclear etiology of his vomiting - whether it is related to his extensive abdominal surgery with alterations in his anatomy vs an acute gastroenteritis. His vomiting is improved and Radiology/Gen Surg feel that the fluid collections are not infectious. We will continue to monitor and advance his diet as tolerated. Superficial wound cultures is growing few pseudomonas aeruginosa. This is not new, records from the [facility] show an abdominal wound culture from 10/29/16 with heavy growth of pseudomonas (unclear if treated). Given that he is afebrile, without leukocytosis, recent C diff history, and surgery opinion that wound drainage is not infective, wound recommend withholding further antibiotics and continue wound care. Will advance diet and consider d/c today if he does well this morning. - General Surgery following; appreciate their assistance - Wound Vac recommendations per surgery appreciated - Advance diet as tolerated - PRN zofran & phenergan  # Pancreatic cancer s/p pancreatic resection 05/13/9922 complicated by would dehiscence and C diff (treated with Vanc) His surgery included splenectomy. Patient is not sure whether he was vaccinated post-op. The records that we received do not make any mention of vaccinations. Currently afebrile - Consider vaccination needs - Hemophilus, Strep pneumo, meningococcus  # AKI Cr 2.26 (up from 1.38 in 2017). Likely prerenal from dehydration 2/2 vomiting which is supported by improvement in creatinine to 1.1 with IV fluid hydration. Will d/c fluids, encourage oral hydration. -  d/c IVF 125 ml/hr  Diet: CLD - advance diet as tolerated VTE PPx: lovenox  Dispo: Anticipated discharge in approximately 0-1 day(s).   Zada Finders, MD  11/11/2016, 9:32 AM  Internal Medicine PGY-3

## 2016-11-11 NOTE — Progress Notes (Signed)
Chipper Herb to be D/C'd Home per MD order.  Discussed with the patient and all questions fully answered.  VSS, Skin clean, dry and intact without evidence of skin break down, no evidence of skin tears noted. IV catheter discontinued intact. Site without signs and symptoms of complications. Dressing and pressure applied.  An After Visit Summary was printed and given to the patient. Patient received prescription.  Allergies as of 11/11/2016      Reactions   Oxycodone Other (See Comments)   Abdominal pain      Medication List    STOP taking these medications   metroNIDAZOLE 500 MG tablet Commonly known as:  FLAGYL     TAKE these medications   Astragalus Extract Powd Take 2 tablets by mouth 2 (two) times daily.   famotidine 20 MG tablet Commonly known as:  PEPCID Take 20 mg by mouth 2 (two) times daily.   FIRST INTENTION PO Take 2 capsules by mouth 3 (three) times daily.   gabapentin 300 MG capsule Commonly known as:  NEURONTIN Take 300 mg by mouth 3 (three) times daily as needed (neuropathy).   ibuprofen 800 MG tablet Commonly known as:  ADVIL,MOTRIN Take 800 mg by mouth every 8 (eight) hours as needed for moderate pain.   LACTOBACILLUS RHAMNOSUS (GG) PO Take 1 capsule by mouth 2 (two) times daily.   lipase/protease/amylase 36000 UNITS Cpep capsule Commonly known as:  CREON Take 36,000 Units by mouth 3 (three) times daily before meals.   loperamide 2 MG capsule Commonly known as:  IMODIUM Take 2 mg by mouth every 2 (two) hours as needed for diarrhea or loose stools.   Melatonin 10 MG Caps Take 10 mg by mouth at bedtime.   metoCLOPramide 10 MG tablet Commonly known as:  REGLAN Take 10 mg by mouth daily as needed for nausea. Pt takes if Zofran ineffective   ondansetron 8 MG tablet Commonly known as:  ZOFRAN Take 8 mg by mouth every 8 (eight) hours as needed for nausea or vomiting.   OVER THE COUNTER MEDICATION Take 1 drop by mouth 2 (two) times daily. [place]  Gold 1200 liquid (1 drop=42.9mg )   OVER THE COUNTER MEDICATION Take 2-3 capsules by mouth 2 (two) times daily. "Curamed" supplement   prochlorperazine 10 MG tablet Commonly known as:  COMPAZINE Take 10 mg by mouth 3 (three) times daily as needed for nausea or vomiting.   rivaroxaban 20 MG Tabs tablet Commonly known as:  XARELTO Take 20 mg by mouth daily with supper. What changed:  Another medication with the same name was removed. Continue taking this medication, and follow the directions you see here.   traMADol 50 MG tablet Commonly known as:  ULTRAM Take 50 mg by mouth every 4 (four) hours as needed for moderate pain.   VITAMIN B-12 PO Take 1 tablet by mouth daily.   Vitamin D3 5000 units Caps Take 5,000 Units by mouth daily.            Discharge Care Instructions        Start     Ordered   11/11/16 0000  Increase activity slowly     11/11/16 1225   11/11/16 0000  Diet - low sodium heart healthy     11/11/16 1225   11/11/16 0000  Discharge instructions    Comments:  Follow up with PCP and Primary Surgeons outpatient. No obvious infection seen during admission. Acute Kidney Injury was secondary to dehydration and resolved with IV fluids.  11/11/16 1225   11/11/16 0000  Call MD for:  temperature >100.4     11/11/16 1225   11/11/16 0000  Call MD for:  persistant nausea and vomiting     11/11/16 1225   11/11/16 0000  Call MD for:  severe uncontrolled pain     11/11/16 1225   11/11/16 0000  Call MD for:  redness, tenderness, or signs of infection (pain, swelling, redness, odor or green/yellow discharge around incision site)     11/11/16 1225   11/11/16 [place]  (Home health needs / face to face )    Comments:  Home Health to resume nursing for Wound Vac 3X weekly.  Question:  To provide the following care/treatments  Answer:  RN   11/11/16 1414   11/11/16 0000  Face-to-face encounter (required for Medicare/Medicaid patients)  (Home health needs / face to  face )    Comments:  I Zada Finders certify that this patient is under my care and that I, or a nurse practitioner or physician's assistant working with me, had a face-to-face encounter that meets the physician face-to-face encounter requirements with this patient on 11/11/2016. The encounter with the patient was in whole, or in part for the following medical condition(s) which is the primary reason for home health care (List medical condition): Post-surgical Abdominal wound with wound vac.  Question Answer Comment  The encounter with the patient was in whole, or in part, for the following medical condition, which is the primary reason for home health care Abdominal wound   I certify that, based on my findings, the following services are medically necessary home health services Nursing   Reason for Medically [place]   My clinical findings support the need for the above services OTHER SEE COMMENTS   Further, I certify that my clinical findings support that this patient is homebound due to: Pain interferes with ambulation/mobility      11/11/16 1414      D/c education completed with patient/family including follow up instructions, medication list, d/c activities limitations if indicated, with other d/c instructions as indicated by MD - patient able to verbalize understanding, all questions fully answered.   Patient instructed to return to ED, call 911, or call MD for any changes in condition.   Patient escorted via [place], and D/C home via private auto.  Dreama Saa 11/11/2016 2:39 PM

## 2016-11-11 NOTE — Progress Notes (Signed)
   Subjective/Chief Complaint: Patient seems to be feeling better today Tolerating diet Diarrhea slowing down   Objective: Vital signs in last 24 hours: Temp:  [98.6 F (37 C)-99.2 F (37.3 C)] 98.6 F (37 C) (09/01 0559) Pulse Rate:  [57-64] 64 (09/01 0559) Resp:  [17-18] 18 (09/01 0559) BP: (115-123)/(67-73) 115/67 (09/01 0559) SpO2:  [100 %] 100 % (09/01 0559) Last BM Date: 11/10/16  Intake/Output from previous day: 08/31 0701 - 09/01 0700 In: 60 [P.O.:60] Out: -  Intake/Output this shift: No intake/output data recorded.  General appearance: alert, cooperative and no distress Clear serous drainage from midline wound - clean granulation tissue almost flush with the skin Wet to dry dressing reapplied  Lab Results:   Recent Labs  11/10/16 1439 11/11/16 0404  WBC 6.1 6.2  HGB 9.2* 8.5*  HCT 30.4* 28.2*  PLT 535* 501*   BMET  Recent Labs  11/10/16 1439 11/11/16 0404  NA 143 143  K 3.1* 3.4*  CL 109 110  CO2 26 24  GLUCOSE 169* 104*  BUN 16 10  CREATININE 1.40* 1.16  CALCIUM 8.8* 8.7*   PT/INR No results for input(s): LABPROT, INR in the last 72 hours. ABG No results for input(s): PHART, HCO3 in the last 72 hours.  Invalid input(s): PCO2, PO2  Studies/Results: No results found.  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  Wound care per [place].  May follow-up with primary surgeon.   LOS: 2 days    Frank Garrett K. 11/11/2016

## 2016-11-12 LAB — AEROBIC CULTURE  (SUPERFICIAL SPECIMEN)

## 2016-11-12 LAB — AEROBIC CULTURE W GRAM STAIN (SUPERFICIAL SPECIMEN)

## 2016-11-15 DIAGNOSIS — Q8901 Asplenia (congenital): Secondary | ICD-10-CM

## 2018-01-07 IMAGING — CR DG CHEST 1V PORT
1 series · 1 of 1 positions shown · non-contrast
Comparison: 02/17/2016.

CLINICAL DATA: Left arm swelling.  History of hypertension.

EXAM:
PORTABLE CHEST 1 VIEW

[AP]
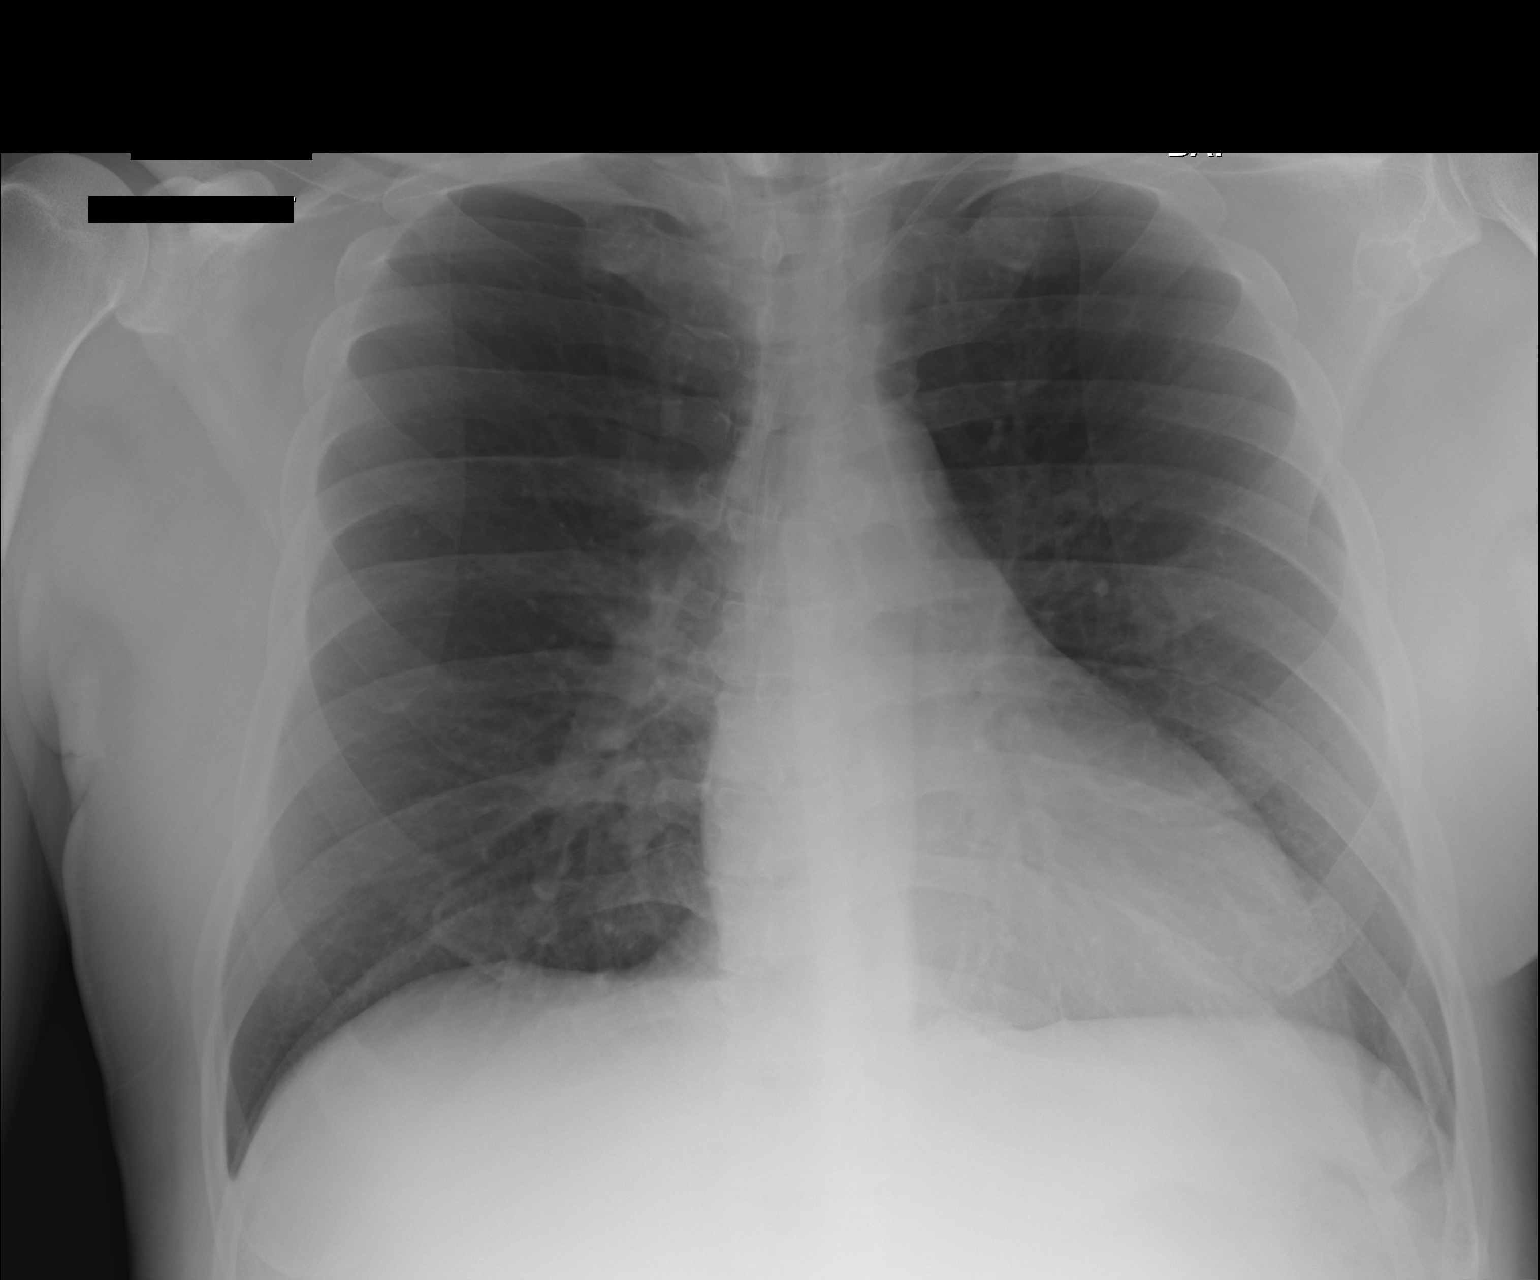

[1 of 1 positions shown; findings below may reference images not displayed]

FINDINGS: 7780 hour. Left subclavian Port-A-Cath tip extends to the SVC right
atrial junction. The heart size and mediastinal contours are stable.
The lungs are clear. There is no pleural effusion or pneumothorax.
No acute osseous findings are evident.
IMPRESSION: No active cardiopulmonary process. Left subclavian Port-A-Cath
appears satisfactorily positioned.

## 2019-05-17 ENCOUNTER — Ambulatory Visit: Payer: Commercial Managed Care - PPO | Attending: Internal Medicine

## 2019-05-17 DIAGNOSIS — Z23 Encounter for immunization: Secondary | ICD-10-CM | POA: Insufficient documentation

## 2019-05-17 NOTE — Progress Notes (Signed)
   Covid-19 Vaccination Clinic  Name:  Frank Garrett    MRN: [id number] DOB: 11-Aug-1960  05/17/2019  Frank Garrett was observed post Covid-19 immunization for 15 minutes without incident. He was provided with Vaccine Information Sheet and instruction to access the V-Safe system.   Frank Garrett was instructed to call 911 with any severe reactions post vaccine: Marland Kitchen Difficulty breathing  . Swelling of face and throat  . A fast heartbeat  . A bad rash all over body  . Dizziness and weakness   Immunizations Administered    Name Date Dose VIS Date Route   Pfizer COVID-19 Vaccine 05/17/2019 12:54 PM 0.3 mL 02/21/2019 Intramuscular   Manufacturer: [place]   Lot: [id number]   [place]: [id number]

## 2019-06-07 ENCOUNTER — Ambulatory Visit: Payer: Commercial Managed Care - PPO | Attending: Internal Medicine

## 2019-06-07 ENCOUNTER — Ambulatory Visit: Payer: Commercial Managed Care - PPO

## 2019-06-07 DIAGNOSIS — Z23 Encounter for immunization: Secondary | ICD-10-CM

## 2019-06-07 NOTE — Progress Notes (Signed)
   Covid-19 Vaccination Clinic  Name:  Jauan Domann    MRN: [id number] DOB: [birth year]  06/07/2019  Mr. Mcaree was observed post Covid-19 immunization for 15 minutes without incident. He was provided with Vaccine Information Sheet and instruction to access the V-Safe system.   Mr. Heitzmann was instructed to call 911 with any severe reactions post vaccine: Marland Kitchen Difficulty breathing  . Swelling of face and throat  . A fast heartbeat  . A bad rash all over body  . Dizziness and weakness   Immunizations Administered    Name Date Dose VIS Date Route   Pfizer COVID-19 Vaccine 06/07/2019 12:11 PM 0.3 mL 02/21/2019 Intramuscular   Manufacturer: [employer]   Lot: [id number]   [place]: [id number]

## 2020-01-27 ENCOUNTER — Ambulatory Visit: Payer: Self-pay

## 2020-01-27 ENCOUNTER — Other Ambulatory Visit: Payer: Self-pay

## 2020-01-27 ENCOUNTER — Other Ambulatory Visit: Payer: Self-pay | Admitting: Occupational Medicine

## 2020-01-27 DIAGNOSIS — M25531 Pain in right wrist: Secondary | ICD-10-CM

## 2020-02-04 DIAGNOSIS — S52501A Unspecified fracture of the lower end of right radius, initial encounter for closed fracture: Secondary | ICD-10-CM | POA: Insufficient documentation

## 2020-02-04 DIAGNOSIS — M25531 Pain in right wrist: Secondary | ICD-10-CM | POA: Insufficient documentation

## 2020-08-25 ENCOUNTER — Emergency Department (HOSPITAL_COMMUNITY)
Admission: EM | Admit: 2020-08-25 | Discharge: 2020-08-26 | Disposition: A | Discharge status: Home / Self Care | Payer: Commercial Managed Care - PPO | Attending: Emergency Medicine | Admitting: Emergency Medicine

## 2020-08-25 ENCOUNTER — Other Ambulatory Visit: Payer: Self-pay

## 2020-08-25 ENCOUNTER — Encounter (HOSPITAL_COMMUNITY): Payer: Self-pay | Admitting: Emergency Medicine

## 2020-08-25 DIAGNOSIS — R55 Syncope and collapse: Secondary | ICD-10-CM

## 2020-08-25 DIAGNOSIS — I10 Essential (primary) hypertension: Secondary | ICD-10-CM | POA: Diagnosis not present

## 2020-08-25 DIAGNOSIS — Z87891 Personal history of nicotine dependence: Secondary | ICD-10-CM | POA: Diagnosis not present

## 2020-08-25 DIAGNOSIS — R001 Bradycardia, unspecified: Secondary | ICD-10-CM | POA: Insufficient documentation

## 2020-08-25 DIAGNOSIS — Z7901 Long term (current) use of anticoagulants: Secondary | ICD-10-CM | POA: Diagnosis not present

## 2020-08-25 DIAGNOSIS — Z8507 Personal history of malignant neoplasm of pancreas: Secondary | ICD-10-CM | POA: Diagnosis not present

## 2020-08-25 LAB — CBC WITH DIFFERENTIAL/PLATELET
Abs Immature Granulocytes: 0.01 10*3/uL (ref 0.00–0.07)
Basophils Absolute: 0 10*3/uL (ref 0.0–0.1)
Basophils Relative: 1 %
Eosinophils Absolute: 0.2 10*3/uL (ref 0.0–0.5)
Eosinophils Relative: 4 %
HCT: 34.6 % — ABNORMAL LOW (ref 39.0–52.0)
Hemoglobin: 10.6 g/dL — ABNORMAL LOW (ref 13.0–17.0)
Immature Granulocytes: 0 %
Lymphocytes Relative: 27 %
Lymphs Abs: 1.5 10*3/uL (ref 0.7–4.0)
MCH: 29.3 pg (ref 26.0–34.0)
MCHC: 30.6 g/dL (ref 30.0–36.0)
MCV: 95.6 fL (ref 80.0–100.0)
Monocytes Absolute: 0.7 10*3/uL (ref 0.1–1.0)
Monocytes Relative: 12 %
Neutro Abs: 3.1 10*3/uL (ref 1.7–7.7)
Neutrophils Relative %: 56 %
Platelets: 280 10*3/uL (ref 150–400)
RBC: 3.62 MIL/uL — ABNORMAL LOW (ref 4.22–5.81)
RDW: 16 % — ABNORMAL HIGH (ref 11.5–15.5)
WBC: 5.6 10*3/uL (ref 4.0–10.5)
nRBC: 0 % (ref 0.0–0.2)

## 2020-08-25 LAB — URINALYSIS, ROUTINE W REFLEX MICROSCOPIC
Bacteria, UA: NONE SEEN
Bilirubin Urine: NEGATIVE
Glucose, UA: NEGATIVE mg/dL
Ketones, ur: NEGATIVE mg/dL
Leukocytes,Ua: NEGATIVE
Nitrite: NEGATIVE
Protein, ur: NEGATIVE mg/dL
Specific Gravity, Urine: 1.016 (ref 1.005–1.030)
pH: 5 (ref 5.0–8.0)

## 2020-08-25 LAB — CK: Total CK: 88 U/L (ref 49–397)

## 2020-08-25 LAB — COMPREHENSIVE METABOLIC PANEL
ALT: 25 U/L (ref 0–44)
AST: 20 U/L (ref 15–41)
Albumin: 3.5 g/dL (ref 3.5–5.0)
Alkaline Phosphatase: 139 U/L — ABNORMAL HIGH (ref 38–126)
Anion gap: 8 (ref 5–15)
BUN: 16 mg/dL (ref 6–20)
CO2: 25 mmol/L (ref 22–32)
Calcium: 9.2 mg/dL (ref 8.9–10.3)
Chloride: 105 mmol/L (ref 98–111)
Creatinine, Ser: 1.09 mg/dL (ref 0.61–1.24)
GFR, Estimated: >60 mL/min (ref >60)
Glucose, Bld: 108 mg/dL — ABNORMAL HIGH (ref 70–99)
Potassium: 4.1 mmol/L (ref 3.5–5.1)
Sodium: 138 mmol/L (ref 135–145)
Total Bilirubin: 0.7 mg/dL (ref 0.3–1.2)
Total Protein: 7 g/dL (ref 6.5–8.1)

## 2020-08-25 NOTE — ED Provider Notes (Signed)
Emergency Medicine Provider Triage Evaluation Note  Jaylan Hinojosa , a 60 y.o. male  was evaluated in triage.  Pt complains of syncopal episode.  He states he was leaving work today and began to feel lightheaded and while talking to someone he passed out.  He did not fall to the ground, caught himself on a dumpster.  No head injury or LOC.  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  He did eat/drink normally today.  He does have known pancreatic cancer (second occurrence)--- gets treatment for this at [facility] but states as far as he knows nothing "critical" right now. Currently, denies dizziness but states he does feel overall a little weak.  Review of Systems  Positive: syncope Negative: Chest pain, SOB, abdominal pain  Physical Exam  BP 119/75 (BP Location: Right Arm)   Pulse 62   Resp 16   SpO2 100%  Gen:   Awake, no distress, thin in appearance Resp:  Normal effort  MSK:   Moves extremities without difficulty    Medical Decision Making  Medically screening exam initiated at 10:16 PM.  Appropriate orders placed.  Gerson Fauth was informed that the remainder of the evaluation will be completed by another provider, this initial triage assessment does not replace that evaluation, and the importance of remaining in the ED until their evaluation is complete.   Larene Pickett, PA-C 08/25/20 2220    Lacretia Leigh, MD 08/26/20 2253

## 2020-08-25 NOTE — ED Triage Notes (Signed)
Pt states he had a syncopal event while walking. Reports feeling dizzy/lightheaded prior to event. Hx pancreatic cancer, currently receiving treatment. A&Ox4 at this time.

## 2020-08-26 ENCOUNTER — Encounter (HOSPITAL_COMMUNITY): Payer: Self-pay | Admitting: Student

## 2020-08-26 NOTE — ED Notes (Signed)
Pt ambulatory in lobby.  

## 2020-08-26 NOTE — ED Provider Notes (Signed)
[facility] EMERGENCY DEPARTMENT Provider Note   CSN: [id number] Arrival date & time: 08/25/20  2156     History Chief Complaint  Patient presents with   Loss of Consciousness    Taahir Grisby is a 60 y.o. male with a history of GERD, hypertension, anemia, and pancreatic cancer who presents to the emergency department status post syncopal episode earlier this evening.  Patient states he had recently transition from sitting to standing, began to ambulate, started to feel dizzy/lightheaded and ultimately had a syncopal episode.  He did not hit his head or fall away to the ground.  He had no associated chest pain or shortness of breath with this.  He states that when he came back to he felt a bit out of it/fatigue.  Currently he feels relatively back to baseline.  No specific alleviating or aggravating factors.  It has been a fairly normal day for him he has been eating and drinking and has not had any recent medication changes, he does work outside.  He denies fever, chills, cough, chest pain, dyspnea, acute abdominal pain, vomiting, or diarrhea.  HPI     Past Medical History:  Diagnosis Date   Anemia    Cancer ([place]) 04/2016   Pancreatic Adenocarcinoma   GERD (gastroesophageal reflux disease)    Hypertension     Patient Active Problem List   Diagnosis Date Noted   AKI (acute kidney injury) ([place]) 11/10/2016   Nausea and vomiting 11/09/2016    Past Surgical History:  Procedure Laterality Date   CHOLECYSTECTOMY  10/19/2016   PANCREAS SURGERY  10/19/2016   SPLENECTOMY, TOTAL  10/19/2016       History reviewed. No pertinent family history.  Social History   Tobacco Use   Smoking status: Former    Packs/day: 1.00    Years: 33.00    Pack years: 33.00    Types: Cigarettes    Quit date: 03/13/2010    Years since quitting: 10.4   Smokeless tobacco: Never  Vaping Use   Vaping Use: Never used  Substance Use Topics   Alcohol use: No    Comment: Former  drinker (stopped in 2012)   Drug use: No    Home Medications Prior to Admission medications   Medication Sig Start Date End Date Taking? Authorizing Provider  Astragalus Extract POWD Take 2 tablets by mouth 2 (two) times daily.    [provider]  Cholecalciferol (VITAMIN D3) 5000 units CAPS Take 5,000 Units by mouth daily.    [provider]  Cyanocobalamin (VITAMIN B-12 PO) Take 1 tablet by mouth daily.    [provider]  famotidine (PEPCID) 20 MG tablet Take 20 mg by mouth 2 (two) times daily.    [provider]  gabapentin (NEURONTIN) 300 MG capsule Take 300 mg by mouth 3 (three) times daily as needed (neuropathy).    [provider]  ibuprofen (ADVIL,MOTRIN) 800 MG tablet Take 800 mg by mouth every 8 (eight) hours as needed for moderate pain.    [provider]  LACTOBACILLUS RHAMNOSUS, GG, PO Take 1 capsule by mouth 2 (two) times daily.    [provider]  lipase/protease/amylase (CREON) 36000 UNITS CPEP capsule Take 36,000 Units by mouth 3 (three) times daily before meals.    [provider]  loperamide (IMODIUM) 2 MG capsule Take 2 mg by mouth every 2 (two) hours as needed for diarrhea or loose stools.    [provider]  Melatonin 10 MG CAPS  Take 10 mg by mouth at bedtime.    [provider]  metoCLOPramide (REGLAN) 10 MG tablet Take 10 mg by mouth daily as needed for nausea. Pt takes if Zofran ineffective 10/23/16   [provider]  Nutritional Supplements (FIRST INTENTION PO) Take 2 capsules by mouth 3 (three) times daily.    [provider]  ondansetron (ZOFRAN) 8 MG tablet Take 8 mg by mouth every 8 (eight) hours as needed for nausea or vomiting.    [provider]  OVER THE COUNTER MEDICATION Take 1 drop by mouth 2 (two) times daily. [place] Gold 1200 liquid (1 drop=42.9mg )    [provider]  OVER THE COUNTER MEDICATION Take 2-3 capsules by mouth 2 (two)  times daily. "Curamed" supplement    [provider]  prochlorperazine (COMPAZINE) 10 MG tablet Take 10 mg by mouth 3 (three) times daily as needed for nausea or vomiting.  06/01/16   [provider]  rivaroxaban (XARELTO) 20 MG TABS tablet Take 20 mg by mouth daily with supper.    [provider]  traMADol (ULTRAM) 50 MG tablet Take 50 mg by mouth every 4 (four) hours as needed for moderate pain.  05/24/16   [provider]    Allergies    Oxycodone  Review of Systems   Review of Systems  Constitutional:  Positive for fatigue. Negative for chills and fever.  Respiratory:  Negative for cough and shortness of breath.   Cardiovascular:  Negative for chest pain.  Gastrointestinal:  Negative for abdominal pain (acute), diarrhea and vomiting.  Neurological:  Positive for syncope.  All other systems reviewed and are negative.  Physical Exam Updated Vital Signs BP (!) 128/93 (BP Location: Right Arm)   Pulse (!) 56   Temp 98.4 F (36.9 C) (Oral)   Resp 16   SpO2 100%   Physical Exam Vitals and nursing note reviewed.  Constitutional:      General: He is not in acute distress.    Appearance: Normal appearance. He is not toxic-appearing.  HENT:     Head: Normocephalic and atraumatic.     Mouth/Throat:     Pharynx: Oropharynx is clear. Uvula midline.  Eyes:     General: Vision grossly intact. Gaze aligned appropriately.     Extraocular Movements: Extraocular movements intact.     Conjunctiva/sclera: Conjunctivae normal.     Pupils: Pupils are equal, round, and reactive to light.     Comments: No proptosis.   Cardiovascular:     Rate and Rhythm: Regular rhythm. Bradycardia present.  Pulmonary:     Effort: Pulmonary effort is normal.     Breath sounds: Normal breath sounds.  Abdominal:     General: There is no distension.     Palpations: Abdomen is soft.     Tenderness: There is no abdominal tenderness. There is no guarding or rebound.   Musculoskeletal:     Cervical back: Normal range of motion and neck supple. No rigidity.     Right lower leg: No edema.     Left lower leg: No edema.  Skin:    General: Skin is warm and dry.  Neurological:     Mental Status: He is alert.     Comments: Alert. Clear speech. No facial droop. CNIII-XII grossly intact. Bilateral upper and lower extremities' sensation grossly intact. 5/5 symmetric strength with grip strength and with plantar and dorsi flexion bilaterally . Normal finger to nose bilaterally. Negative pronator drift. Gait intact.  Psychiatric:        Mood and Affect: Mood normal.        Behavior: Behavior normal.    ED Results / Procedures / Treatments   Labs (all labs ordered are listed, but only abnormal results are displayed) Labs Reviewed  CBC WITH DIFFERENTIAL/PLATELET - Abnormal; Notable for the following components:      Result Value   RBC 3.62 (*)    Hemoglobin 10.6 (*)    HCT 34.6 (*)    RDW 16.0 (*)    All other components within normal limits  COMPREHENSIVE METABOLIC PANEL - Abnormal; Notable for the following components:   Glucose, Bld 108 (*)    Alkaline Phosphatase 139 (*)    All other components within normal limits  URINALYSIS, ROUTINE W REFLEX MICROSCOPIC - Abnormal; Notable for the following components:   APPearance HAZY (*)    Hgb urine dipstick SMALL (*)    All other components within normal limits  CK  CBG MONITORING, ED    EKG EKG Interpretation  Date/Time:  Wednesday August 25 2020 22:22:03 EDT Ventricular Rate:  61 PR Interval:  168 QRS Duration: 82 QT Interval:  402 QTC Calculation: 404 R Axis:   73 Text Interpretation: Normal sinus rhythm Cannot rule out Anterior infarct , age undetermined Abnormal ECG When compared to prior, similar appearance. No STEMI Confirmed by Antony Blackbird [phone]) on 08/26/2020 6:13:41 AM  Radiology No results found.  Procedures Procedures   Medications Ordered in ED Medications - No data to  display  ED Course  I have reviewed the triage vital signs and the nursing notes.  Pertinent labs & imaging results that were available during my care of the patient were reviewed by me and considered in my medical decision making (see chart for details).    MDM Rules/Calculators/A&P                          Patient presents to the ED with complaints of syncopal episode earlier this evening.  Patient is nontoxic, resting comfortably, he is mildly bradycardic but has a history of this previously.  He has a benign physical exam without focal neurologic deficits.  Additional history obtained:  Additional history obtained from chart review & nursing note review.   EKG: When compared to previous similar.  Lab Tests:  I reviewed and interpreted labs, which included:  CBC: Mild anemia improved from prior CMP: Fairly unremarkable, no significant electrolyte derangement and renal function is preserved. CK: Within normal limits Urinalysis: Small hemoglobin present, will need PCP recheck.  ED Course:  Patient without arrhythmia or tachycardia while here in the department, mildly bradycardic- hx of same in the past. EKG similar to prior.   Patient without history of congestive heart failure, hematocrit is not < 30% no shortness of breath/chest pain associated with syncope, and systolic blood pressure greater than 90;. Patient overall felt to be low risk. He is feeling better currently. Will plan for discharge home with close PCP follow-up.  I discussed results, treatment plan, need for follow-up, and return precautions with the patient. Provided opportunity for questions, patient confirmed understanding and is in agreement with plan.   BP (!) 128/93 (BP Location: Right Arm)   Pulse (!) 56   Temp 98.4 F (36.9 C) (Oral)   Resp 16   SpO2 100%    Findings and plan of care discussed with supervising physician Dr. Sherry Ruffing who is in agreement.  Portions of this  note were generated with Geographical information systems officer. Dictation errors may occur despite best attempts at proofreading.  Final Clinical Impression(s) / ED Diagnoses Final diagnoses:  Syncope, unspecified syncope type    Rx / DC Orders ED Discharge Orders     None        Amaryllis Dyke, PA-C 08/26/20 0102    Tegeler, Gwenyth Allegra, MD 08/26/20 [phone]

## 2020-08-26 NOTE — Discharge Instructions (Addendum)
You were seen in the emergency department after passing out, otherwise noticed a syncopal episode.  Your work-up in the ER was overall reassuring, your labs were similar to prior blood work you have had done or improved.  You did have a small amount of blood in your urine, please have this rechecked by your primary care provider.  Please be sure to stay well-hydrated and rest.  Please follow-up with your primary care provider within 3 days for reevaluation.  Return to the ER for new or worsening symptoms including but not limited to recurrence of passing out, chest pain, trouble breathing, fever, inability to keep fluids down, coughing up blood, or any other concerns.

## 2020-08-26 NOTE — ED Notes (Signed)
Water given to drink 

## 2020-08-26 NOTE — ED Notes (Signed)
Pt came up to NT stating he wanted to leave because he is "feeling much better".  NT encouraged pt to stay despite his frustration with the wait time.

## 2020-11-14 ENCOUNTER — Encounter: Payer: Self-pay | Admitting: Surgery

## 2020-11-14 ENCOUNTER — Inpatient Hospital Stay (HOSPITAL_COMMUNITY): Payer: Commercial Managed Care - PPO

## 2020-11-14 ENCOUNTER — Other Ambulatory Visit: Payer: Self-pay

## 2020-11-14 ENCOUNTER — Inpatient Hospital Stay (HOSPITAL_COMMUNITY)
Admission: EM | Admit: 2020-11-14 | Discharge: 2020-11-29 | DRG: 389 | Disposition: A | Discharge status: Home / Self Care | Payer: Commercial Managed Care - PPO | Attending: Internal Medicine | Admitting: Internal Medicine

## 2020-11-14 ENCOUNTER — Emergency Department (HOSPITAL_COMMUNITY): Payer: Commercial Managed Care - PPO

## 2020-11-14 ENCOUNTER — Encounter (HOSPITAL_COMMUNITY): Payer: Self-pay

## 2020-11-14 DIAGNOSIS — E440 Moderate protein-calorie malnutrition: Secondary | ICD-10-CM | POA: Diagnosis present

## 2020-11-14 DIAGNOSIS — Z79899 Other long term (current) drug therapy: Secondary | ICD-10-CM

## 2020-11-14 DIAGNOSIS — L89152 Pressure ulcer of sacral region, stage 2: Secondary | ICD-10-CM | POA: Diagnosis present

## 2020-11-14 DIAGNOSIS — D638 Anemia in other chronic diseases classified elsewhere: Secondary | ICD-10-CM | POA: Diagnosis present

## 2020-11-14 DIAGNOSIS — Q8901 Asplenia (congenital): Secondary | ICD-10-CM | POA: Diagnosis not present

## 2020-11-14 DIAGNOSIS — E872 Acidosis: Secondary | ICD-10-CM | POA: Diagnosis present

## 2020-11-14 DIAGNOSIS — K219 Gastro-esophageal reflux disease without esophagitis: Secondary | ICD-10-CM | POA: Diagnosis present

## 2020-11-14 DIAGNOSIS — N179 Acute kidney failure, unspecified: Secondary | ICD-10-CM | POA: Diagnosis present

## 2020-11-14 DIAGNOSIS — C787 Secondary malignant neoplasm of liver and intrahepatic bile duct: Secondary | ICD-10-CM | POA: Diagnosis present

## 2020-11-14 DIAGNOSIS — Z885 Allergy status to narcotic agent status: Secondary | ICD-10-CM

## 2020-11-14 DIAGNOSIS — E870 Hyperosmolality and hypernatremia: Secondary | ICD-10-CM | POA: Diagnosis present

## 2020-11-14 DIAGNOSIS — R079 Chest pain, unspecified: Secondary | ICD-10-CM | POA: Diagnosis not present

## 2020-11-14 DIAGNOSIS — R1084 Generalized abdominal pain: Secondary | ICD-10-CM | POA: Diagnosis not present

## 2020-11-14 DIAGNOSIS — K529 Noninfective gastroenteritis and colitis, unspecified: Secondary | ICD-10-CM | POA: Diagnosis present

## 2020-11-14 DIAGNOSIS — Z978 Presence of other specified devices: Secondary | ICD-10-CM

## 2020-11-14 DIAGNOSIS — Z0189 Encounter for other specified special examinations: Secondary | ICD-10-CM

## 2020-11-14 DIAGNOSIS — K2960 Other gastritis without bleeding: Secondary | ICD-10-CM | POA: Diagnosis present

## 2020-11-14 DIAGNOSIS — D849 Immunodeficiency, unspecified: Secondary | ICD-10-CM | POA: Diagnosis present

## 2020-11-14 DIAGNOSIS — K5669 Other partial intestinal obstruction: Principal | ICD-10-CM | POA: Diagnosis present

## 2020-11-14 DIAGNOSIS — K56609 Unspecified intestinal obstruction, unspecified as to partial versus complete obstruction: Principal | ICD-10-CM

## 2020-11-14 DIAGNOSIS — I10 Essential (primary) hypertension: Secondary | ICD-10-CM | POA: Diagnosis present

## 2020-11-14 DIAGNOSIS — C7800 Secondary malignant neoplasm of unspecified lung: Secondary | ICD-10-CM | POA: Diagnosis present

## 2020-11-14 DIAGNOSIS — F419 Anxiety disorder, unspecified: Secondary | ICD-10-CM | POA: Diagnosis not present

## 2020-11-14 DIAGNOSIS — L8990 Pressure ulcer of unspecified site, unspecified stage: Secondary | ICD-10-CM | POA: Insufficient documentation

## 2020-11-14 DIAGNOSIS — R112 Nausea with vomiting, unspecified: Secondary | ICD-10-CM | POA: Diagnosis present

## 2020-11-14 DIAGNOSIS — R64 Cachexia: Secondary | ICD-10-CM | POA: Diagnosis present

## 2020-11-14 DIAGNOSIS — R0789 Other chest pain: Secondary | ICD-10-CM | POA: Diagnosis not present

## 2020-11-14 DIAGNOSIS — Z8616 Personal history of COVID-19: Secondary | ICD-10-CM

## 2020-11-14 DIAGNOSIS — Z681 Body mass index (BMI) 19 or less, adult: Secondary | ICD-10-CM | POA: Diagnosis not present

## 2020-11-14 DIAGNOSIS — Z90410 Acquired total absence of pancreas: Secondary | ICD-10-CM

## 2020-11-14 DIAGNOSIS — Z87891 Personal history of nicotine dependence: Secondary | ICD-10-CM

## 2020-11-14 DIAGNOSIS — Z9049 Acquired absence of other specified parts of digestive tract: Secondary | ICD-10-CM

## 2020-11-14 DIAGNOSIS — Z7901 Long term (current) use of anticoagulants: Secondary | ICD-10-CM

## 2020-11-14 DIAGNOSIS — Z79891 Long term (current) use of opiate analgesic: Secondary | ICD-10-CM

## 2020-11-14 DIAGNOSIS — Z8507 Personal history of malignant neoplasm of pancreas: Secondary | ICD-10-CM

## 2020-11-14 DIAGNOSIS — D709 Neutropenia, unspecified: Secondary | ICD-10-CM | POA: Diagnosis present

## 2020-11-14 DIAGNOSIS — Z515 Encounter for palliative care: Secondary | ICD-10-CM | POA: Diagnosis not present

## 2020-11-14 DIAGNOSIS — M25511 Pain in right shoulder: Secondary | ICD-10-CM | POA: Diagnosis not present

## 2020-11-14 DIAGNOSIS — C259 Malignant neoplasm of pancreas, unspecified: Secondary | ICD-10-CM | POA: Diagnosis present

## 2020-11-14 DIAGNOSIS — Z7969 Long term (current) use of other immunomodulators and immunosuppressants: Secondary | ICD-10-CM

## 2020-11-14 DIAGNOSIS — Z9221 Personal history of antineoplastic chemotherapy: Secondary | ICD-10-CM

## 2020-11-14 DIAGNOSIS — Z9081 Acquired absence of spleen: Secondary | ICD-10-CM

## 2020-11-14 DIAGNOSIS — E860 Dehydration: Secondary | ICD-10-CM | POA: Diagnosis present

## 2020-11-14 DIAGNOSIS — Z86711 Personal history of pulmonary embolism: Secondary | ICD-10-CM

## 2020-11-14 DIAGNOSIS — Z7189 Other specified counseling: Secondary | ICD-10-CM | POA: Diagnosis not present

## 2020-11-14 HISTORY — DX: Malignant neoplasm of pancreas, unspecified: C25.9

## 2020-11-14 LAB — CBC
HCT: 28.4 % — ABNORMAL LOW (ref 39.0–52.0)
Hemoglobin: 8.8 g/dL — ABNORMAL LOW (ref 13.0–17.0)
MCH: 29 pg (ref 26.0–34.0)
MCHC: 31 g/dL (ref 30.0–36.0)
MCV: 93.7 fL (ref 80.0–100.0)
Platelets: 198 10*3/uL (ref 150–400)
RBC: 3.03 MIL/uL — ABNORMAL LOW (ref 4.22–5.81)
RDW: 17 % — ABNORMAL HIGH (ref 11.5–15.5)
WBC: 2.1 10*3/uL — ABNORMAL LOW (ref 4.0–10.5)
nRBC: 13.3 % — ABNORMAL HIGH (ref 0.0–0.2)

## 2020-11-14 LAB — COMPREHENSIVE METABOLIC PANEL
ALT: 79 U/L — ABNORMAL HIGH (ref 0–44)
AST: 78 U/L — ABNORMAL HIGH (ref 15–41)
Albumin: 3.7 g/dL (ref 3.5–5.0)
Alkaline Phosphatase: 255 U/L — ABNORMAL HIGH (ref 38–126)
Anion gap: 9 (ref 5–15)
BUN: 16 mg/dL (ref 6–20)
CO2: 19 mmol/L — ABNORMAL LOW (ref 22–32)
Calcium: 9.7 mg/dL (ref 8.9–10.3)
Chloride: 112 mmol/L — ABNORMAL HIGH (ref 98–111)
Creatinine, Ser: 0.92 mg/dL (ref 0.61–1.24)
GFR, Estimated: >60 mL/min (ref >60)
Glucose, Bld: 123 mg/dL — ABNORMAL HIGH (ref 70–99)
Potassium: 4.1 mmol/L (ref 3.5–5.1)
Sodium: 140 mmol/L (ref 135–145)
Total Bilirubin: 3 mg/dL — ABNORMAL HIGH (ref 0.3–1.2)
Total Protein: 8.4 g/dL — ABNORMAL HIGH (ref 6.5–8.1)

## 2020-11-14 LAB — DIFFERENTIAL
Abs Immature Granulocytes: 0.02 10*3/uL (ref 0.00–0.07)
Basophils Absolute: 0 10*3/uL (ref 0.0–0.1)
Basophils Relative: 1 %
Eosinophils Absolute: 0 10*3/uL (ref 0.0–0.5)
Eosinophils Relative: 1 %
Immature Granulocytes: 1 %
Lymphocytes Relative: 43 %
Lymphs Abs: 0.9 10*3/uL (ref 0.7–4.0)
Monocytes Absolute: 0.3 10*3/uL (ref 0.1–1.0)
Monocytes Relative: 15 %
Neutro Abs: 0.8 10*3/uL — ABNORMAL LOW (ref 1.7–7.7)
Neutrophils Relative %: 39 %

## 2020-11-14 LAB — URINALYSIS, ROUTINE W REFLEX MICROSCOPIC
Glucose, UA: NEGATIVE mg/dL
Hgb urine dipstick: NEGATIVE
Ketones, ur: NEGATIVE mg/dL
Leukocytes,Ua: NEGATIVE
Nitrite: NEGATIVE
Specific Gravity, Urine: 1.02 (ref 1.005–1.030)
pH: 5.5 (ref 5.0–8.0)

## 2020-11-14 LAB — LACTIC ACID, PLASMA: Lactic Acid, Venous: 2.3 mmol/L (ref 0.5–1.9)

## 2020-11-14 LAB — LIPASE, BLOOD: Lipase: 207 U/L — ABNORMAL HIGH (ref 11–51)

## 2020-11-14 LAB — RESP PANEL BY RT-PCR (FLU A&B, COVID) ARPGX2
Influenza A by PCR: NEGATIVE
Influenza B by PCR: NEGATIVE
SARS Coronavirus 2 by RT PCR: POSITIVE — AB

## 2020-11-14 LAB — C-REACTIVE PROTEIN: CRP: 1.2 mg/dL — ABNORMAL HIGH (ref <1.0)

## 2020-11-14 LAB — PREALBUMIN: Prealbumin: 16 mg/dL — ABNORMAL LOW (ref 18–38)

## 2020-11-14 MED ORDER — PHENOL 1.4 % MT LIQD
2.0000 | OROMUCOSAL | Status: DC | PRN
  Filled 2020-11-14: qty 177

## 2020-11-14 MED ORDER — DIPHENHYDRAMINE HCL 50 MG/ML IJ SOLN
12.5000 mg | Freq: Four times a day (QID) | INTRAMUSCULAR | Status: DC | PRN
  Administered 2020-11-14: 12.5 mg via INTRAVENOUS
  Filled 2020-11-14: qty 1

## 2020-11-14 MED ORDER — LACTATED RINGERS IV BOLUS: 1000.0000 mL | Freq: Three times a day (TID) | INTRAVENOUS | Status: AC | PRN

## 2020-11-14 MED ORDER — ALUM & MAG HYDROXIDE-SIMETH 200-200-20 MG/5ML PO SUSP: 30.0000 mL | Freq: Four times a day (QID) | ORAL | Status: DC | PRN

## 2020-11-14 MED ORDER — METHOCARBAMOL 1000 MG/10ML IJ SOLN
1000.0000 mg | Freq: Four times a day (QID) | INTRAVENOUS | Status: DC | PRN
  Administered 2020-11-15 – 2020-11-17 (×3): 1000 mg via INTRAVENOUS
  Filled 2020-11-14: qty 1000
  Filled 2020-11-14: qty 10
  Filled 2020-11-14 (×2): qty 1000

## 2020-11-14 MED ORDER — SIMETHICONE 40 MG/0.6ML PO SUSP
80.0000 mg | Freq: Four times a day (QID) | ORAL | Status: DC | PRN
  Filled 2020-11-14: qty 1.2

## 2020-11-14 MED ORDER — LIP MEDEX EX OINT
1.0000 "application " | TOPICAL_OINTMENT | Freq: Two times a day (BID) | CUTANEOUS | Status: DC
  Administered 2020-11-14 – 2020-11-29 (×29): 1 via TOPICAL
  Filled 2020-11-14 (×3): qty 7

## 2020-11-14 MED ORDER — METOPROLOL TARTRATE 5 MG/5ML IV SOLN: 5.0000 mg | Freq: Four times a day (QID) | INTRAVENOUS | Status: DC | PRN

## 2020-11-14 MED ORDER — ACETAMINOPHEN 650 MG RE SUPP: 650.0000 mg | Freq: Four times a day (QID) | RECTAL | Status: DC | PRN

## 2020-11-14 MED ORDER — ONDANSETRON HCL 4 MG/2ML IJ SOLN
4.0000 mg | Freq: Four times a day (QID) | INTRAMUSCULAR | Status: DC | PRN
  Administered 2020-11-14: 4 mg via INTRAVENOUS
  Filled 2020-11-14 (×2): qty 2

## 2020-11-14 MED ORDER — DIPHENHYDRAMINE HCL 50 MG/ML IJ SOLN
12.5000 mg | Freq: Once | INTRAMUSCULAR | Status: AC
  Administered 2020-11-14: 12.5 mg via INTRAVENOUS
  Filled 2020-11-14: qty 1

## 2020-11-14 MED ORDER — ENOXAPARIN SODIUM 60 MG/0.6ML IJ SOSY
60.0000 mg | PREFILLED_SYRINGE | Freq: Two times a day (BID) | INTRAMUSCULAR | Status: AC
  Administered 2020-11-14 – 2020-11-22 (×16): 60 mg via SUBCUTANEOUS
  Filled 2020-11-14 (×16): qty 0.6

## 2020-11-14 MED ORDER — SODIUM CHLORIDE 0.9 % IV SOLN
8.0000 mg | Freq: Four times a day (QID) | INTRAVENOUS | Status: DC | PRN
  Filled 2020-11-14: qty 4

## 2020-11-14 MED ORDER — ENOXAPARIN SODIUM 40 MG/0.4ML IJ SOSY: 40.0000 mg | PREFILLED_SYRINGE | INTRAMUSCULAR | Status: DC

## 2020-11-14 MED ORDER — HYDROMORPHONE HCL 1 MG/ML IJ SOLN
0.5000 mg | INTRAMUSCULAR | Status: DC | PRN
  Administered 2020-11-14 – 2020-11-28 (×10): 1 mg via INTRAVENOUS
  Filled 2020-11-14 (×10): qty 1

## 2020-11-14 MED ORDER — MAGIC MOUTHWASH
15.0000 mL | Freq: Four times a day (QID) | ORAL | Status: DC | PRN
  Filled 2020-11-14: qty 15

## 2020-11-14 MED ORDER — PROCHLORPERAZINE EDISYLATE 10 MG/2ML IJ SOLN: 5.0000 mg | INTRAMUSCULAR | Status: DC | PRN

## 2020-11-14 MED ORDER — HYDROMORPHONE HCL 1 MG/ML IJ SOLN
INTRAMUSCULAR | Status: AC
  Filled 2020-11-14: qty 1

## 2020-11-14 MED ORDER — LACTATED RINGERS IV BOLUS
1000.0000 mL | Freq: Once | INTRAVENOUS | Status: AC
  Administered 2020-11-14: 1000 mL via INTRAVENOUS

## 2020-11-14 MED ORDER — BISACODYL 10 MG RE SUPP
10.0000 mg | Freq: Every day | RECTAL | Status: DC
  Administered 2020-11-16: 10 mg via RECTAL
  Filled 2020-11-14 (×6): qty 1

## 2020-11-14 MED ORDER — DIATRIZOATE MEGLUMINE & SODIUM 66-10 % PO SOLN
90.0000 mL | Freq: Once | ORAL | Status: AC
  Administered 2020-11-15: 90 mL via NASOGASTRIC
  Filled 2020-11-14: qty 90

## 2020-11-14 MED ORDER — IOHEXOL 350 MG/ML SOLN
80.0000 mL | Freq: Once | INTRAVENOUS | Status: AC | PRN
  Administered 2020-11-14: 80 mL via INTRAVENOUS

## 2020-11-14 MED ORDER — MENTHOL 3 MG MT LOZG
1.0000 | LOZENGE | OROMUCOSAL | Status: DC | PRN
  Filled 2020-11-14: qty 9

## 2020-11-14 MED ORDER — METOCLOPRAMIDE HCL 5 MG/ML IJ SOLN
5.0000 mg | Freq: Once | INTRAMUSCULAR | Status: AC
  Administered 2020-11-14: 5 mg via INTRAVENOUS
  Filled 2020-11-14: qty 2

## 2020-11-14 NOTE — ED Provider Notes (Signed)
[facility] DEPT Provider Note   CSN: [id number] Arrival date & time: 11/14/20  1011     History Chief Complaint  Patient presents with   Emesis    Frank Garrett is a 60 y.o. male.  HPI  Patient with history of pancreatic cancer with mets presents with nausea and emesis x1 day.  He tried Zofran without relief, emesis is constant throughout the night.  There is associated nausea and epigastric abdominal pain.  He has not had any fevers at home.  He also has constant diarrhea since his cholecystectomy 5 years ago, he took multiple Lomotil 3 days ago and has been constipated for the last 2 days.  He denies any passing of gas.  His last chemo treatment was 2 weeks ago.  Patient had a PE August 1, he was on Lovenox injections but recently started Xarelto.  No missed dose until yesterday when he vomited up the medicine.  He is not having any chest pain or shortness of breath.  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 No pertinent family history.  Social History   Tobacco Use   Smoking status: Former    Packs/day: 1.00    Years: 33.00    Pack years: 33.00    Types: Cigarettes    Quit date: 03/13/2010    Years since quitting: 10.6   Smokeless tobacco: Never  Vaping Use   Vaping Use: Never used  Substance Use Topics   Alcohol use: No    Comment: Former drinker (stopped in 2012)   Drug use: No    Home Medications Prior to Admission medications   Medication Sig Start Date End Date Taking? Authorizing Provider   Astragalus Extract POWD Take 2 tablets by mouth 2 (two) times daily.    [provider]  Cholecalciferol (VITAMIN D3) 5000 units CAPS Take 5,000 Units by mouth daily.    [provider]  Cyanocobalamin (VITAMIN B-12 PO) Take 1 tablet by mouth daily.    [provider]  famotidine (PEPCID) 20 MG tablet Take 20 mg by mouth 2 (two) times daily.    [provider]  gabapentin (NEURONTIN) 300 MG capsule Take 300 mg by mouth 3 (three) times daily as needed (neuropathy).    [provider]  ibuprofen (ADVIL,MOTRIN) 800 MG tablet Take 800 mg by mouth every 8 (eight) hours as needed for moderate pain.    [provider]  LACTOBACILLUS RHAMNOSUS, GG, PO Take 1 capsule by mouth 2 (two) times daily.    [provider]  lipase/protease/amylase (CREON) 36000 UNITS CPEP capsule Take 36,000 Units by mouth 3 (three) times daily before meals.    [provider]  loperamide (IMODIUM) 2 MG capsule Take 2 mg by mouth every 2 (two) hours as needed for diarrhea or loose stools.    [provider]  Melatonin 10 MG CAPS Take 10 mg by mouth at bedtime.    [provider]  metoCLOPramide (REGLAN) 10 MG tablet Take 10 mg by  mouth daily as needed for nausea. Pt takes if Zofran ineffective 10/23/16   [provider]  Nutritional Supplements (FIRST INTENTION PO) Take 2 capsules by mouth 3 (three) times daily.    [provider]  ondansetron (ZOFRAN) 8 MG tablet Take 8 mg by mouth every 8 (eight) hours as needed for nausea or vomiting.    [provider]  OVER THE COUNTER MEDICATION Take 1 drop by mouth 2 (two) times daily. [place] Gold 1200 liquid (1 drop=42.'9mg'$ )    [provider]  OVER THE COUNTER MEDICATION Take 2-3 capsules by mouth 2 (two) times daily. "Curamed" supplement    [provider]  prochlorperazine (COMPAZINE) 10 MG tablet Take 10 mg by mouth 3 (three) times daily as needed for  nausea or vomiting.  06/01/16   [provider]  rivaroxaban (XARELTO) 20 MG TABS tablet Take 20 mg by mouth daily with supper.    [provider]  traMADol (ULTRAM) 50 MG tablet Take 50 mg by mouth every 4 (four) hours as needed for moderate pain.  05/24/16   [provider]    Allergies    Oxycodone  Review of Systems   Review of Systems  Constitutional:  Negative for fever.  Respiratory:  Negative for shortness of breath.   Cardiovascular:  Negative for chest pain and leg swelling.  Gastrointestinal:  Positive for abdominal pain, constipation, diarrhea, nausea and vomiting.  Musculoskeletal:  Negative for back pain.   Physical Exam Updated Vital Signs BP 107/68   Pulse 69   Temp 98.1 F (36.7 C) (Oral)   Resp 15   SpO2 100%   Physical Exam Vitals and nursing note reviewed. Exam conducted with a chaperone present.  Constitutional:      Appearance: Normal appearance.     Comments: Chronically ill-appearing  HENT:     Head: Normocephalic and atraumatic.  Eyes:     General: No scleral icterus.       Right eye: No discharge.        Left eye: No discharge.     Extraocular Movements: Extraocular movements intact.     Pupils: Pupils are equal, round, and reactive to light.  Cardiovascular:     Rate and Rhythm: Normal rate and regular rhythm.     Pulses: Normal pulses.     Heart sounds: Normal heart sounds. No murmur heard.   No friction rub. No gallop.  Pulmonary:     Effort: Pulmonary effort is normal. No respiratory distress.     Breath sounds: Normal breath sounds.  Abdominal:     General: Abdomen is flat. Bowel sounds are normal. There is no distension.     Palpations: Abdomen is soft.     Tenderness: There is abdominal tenderness in the epigastric area and periumbilical area. There is guarding. There is no rebound.  Skin:    General: Skin is warm and dry.     Coloration: Skin is not jaundiced.  Neurological:     Mental Status: He is  alert. Mental status is at baseline.     Coordination: Coordination normal.    ED Results / Procedures / Treatments   Labs (all labs ordered are listed, but only abnormal results are displayed) Labs Reviewed  CBC - Abnormal; Notable for the following components:      Result Value   RBC 3.03 (*)    Hemoglobin 8.8 (*)    HCT 28.4 (*)    RDW 17.0 (*)    All other  components within normal limits  URINALYSIS, ROUTINE W REFLEX MICROSCOPIC  DIFFERENTIAL  COMPREHENSIVE METABOLIC PANEL  LIPASE, BLOOD  LACTIC ACID, PLASMA  LACTIC ACID, PLASMA    EKG None  Radiology No results found.  Procedures Procedures   Medications Ordered in ED Medications  metoCLOPramide (REGLAN) injection 5 mg (has no administration in time range)  diphenhydrAMINE (BENADRYL) injection 12.5 mg (has no administration in time range)    ED Course  I have reviewed the triage vital signs and the nursing notes.  Pertinent labs & imaging results that were available during my care of the patient were reviewed by me and considered in my medical decision making (see chart for details).    MDM Rules/Calculators/A&P                           Patient vitals are stable, he is not tachycardic or febrile.  Doubt sepsis.  Patient is history of pancreatic CA with mets, he is also having constipation for the last 2 days without passing gas, this is concerning for bowel obstruction, will order labs and CT imaging to assess for pathology given that he has a history of pancreatic cancer with mets.    CT revealing a bowel obstruction.  Consulted with general surgery, spoke with Dr. Richardson Landry gross.  He advised NG tube and starting the patient on small bowel protocol.  He will consult on the patient, he does not think he should be primary.  I will put in consult with hospitalist service and try to admit.  Patient's oncologist is unfortunately all the way in [place].  Dr. Marylyn Ishihara to admit the patient.  He will consult with Dr.  Johney Maine.  Final Clinical Impression(s) / ED Diagnoses Final diagnoses:  None    Rx / DC Orders ED Discharge Orders     None        Sherrill Raring, PA-C 11/14/20 Harmon, Louisa, DO 11/14/20 1752

## 2020-11-14 NOTE — ED Triage Notes (Signed)
Pt BIB EMS from home. Pt has had N/V since last night. Pt has tried PO Zofran at home without relief. Pt has hx of pancreatic cancer with metastasis.   20G IV FA Zofran '4mg'$ 

## 2020-11-14 NOTE — Consult Note (Signed)
Frank Garrett  [birth year] [id number]  CARE TEAM:  PCP: Gregor Hams, FNP  Outpatient Care Team: Patient Care Team: Gregor Hams, FNP as PCP - General (Family Medicine)  Inpatient Treatment Team: Treatment Team: Attending Provider: Jonnie Finner DO; Registered Nurse: Willene Hatchet, RN; Physician Assistant: Sherrill Raring, PA-C; Technician: Marjo Bicker, NT; Consulting Physician: Edison Pace, Md, MD   This patient is a 60 y.o.male who presents today for surgical evaluation at the request of Britt Boozer, [place].   Chief complaint / Reason for evaluation: Abdominal pain and probable bowel obstruction in the setting of metastatic cancer.  Patient with history of pancreatectomy, splenectomy.  Venous resection.  This was in 2018.  This was in [place] at the [facility].  He came back to [facility] is readmitted with complaints but eventually improved a month later.  Not seen by oncology or surgery here for the past 4 years.  According to his wife he has known metastatic disease to liver and lung.  On palliative chemotherapy on 1 week, off another week.  Xeloda and gemcitabine based.  He is 7 days from his last dose.  Patient usually moves his bowels every day but has not had any flatus or bowel movements for the past 24 hours and had crampy pain and nausea vomiting.  Came into the emergency department.  No peritonitis.  CT scan shows dilated stomach to duodenum and proximal jejunum with transition zone small jejunal retroperitoneum at the site of his former suspicious for bowel obstruction.  Consultation requested.  Patient has had unintentional weight loss.  History.  Wife recalls them doing an endoscopy to rule out esophageal issues records are available from the cancer center treatments in [place].   Assessment  Frank Garrett  60 y.o. male       Problem List:  Principal Problem:   SBO (small bowel obstruction) (HCC) Active Problems:   Pancreas cancer (HCC)    Nausea and vomiting   Asplenia   Chronic anticoagulation   Masses of multiple sites of liver, probable pancreatic cancer metastasis   Proximal jejunal small bowel obstruction and the retroperitoneum where his pancreas used to be strongly suspicious for the transition zone.  Diffuse metastatic liver disease.  Unintentional weight loss  Plan:  IV fluid resuscitation  Nasogastric tube decompression.  Small bowel certain that he has progressive metastatic disease is leading to malignant bowel obstruction.  There is no indication for emergency surgery right now, his disease is progressing.   I think he would benefit from palliative care discussions for goals of care.  Ideally medical oncology here would be involved to help guide care but given the fact they have chosen to have care in [place], that may not be if possible.  It may be he may need palliative G-tube.  Could consider internal bypass but with his massive prior abdominal surgeries, surgical risks increased.  Patient with diagnosis of pulmonary embolism this year.  Was on Lovenox and transition to oral Xarelto anticoagulation.  Most likely needs to go back to subcutaneous Lovenox.  VTE prophylaxis- SCDs, etc.  mobilize as tolerated to help recovery  40 minutes spent in review, evaluation, examination, counseling, and coordination of care.  More than 50% of that time was spent in counseling.  Adin Hector, MD, FACS, MASCRS Esophageal, Gastrointestinal & Colorectal Surgery Robotic and Minimally Invasive Surgery  [facility], [place]  [place]. [address], [phone]  [place], [place] [address] [phone] Fax [phone] Main  CONTACT INFORMATION:  Weekday (9AM-5PM): Call [facility] main office at [phone]  Weeknight (5PM-9AM) or Weekend/Holiday: Check [email or web address] (password " [email or web address]") for General Surgery [facility] coverage  (Please, do not use SecureChat as it is not  reliable communication to operating surgeons for immediate patient care)      11/14/2020      Past Medical History:  Diagnosis Date   Anemia    Cancer ([place]) 04/2016   Pancreatic Adenocarcinoma   GERD (gastroesophageal reflux disease)    Hypertension    Pancreas cancer [facility])    Pancreatic cancer s/p radical distal pancreaticosplenectomy with resection of celiac axis, SMV, portal vein repair, lateral venorrhaphy, abdominal lymph node dissection, cholecystectomy on 10/19/2016 at [place].    Past Surgical History:  Procedure Laterality Date   CHOLECYSTECTOMY  10/19/2016   DISTAL PANCREATECTOMY  10/19/2016   Pancreatic cancer s/p radical distal pancreaticosplenectomy with resection of celiac axis, SMV, portal vein repair, lateral venorrhaphy, abdominal lymph node dissection, cholecystectomy on 10/19/2016 at [place].   SPLENECTOMY, TOTAL  10/19/2016    Social History   Socioeconomic History   Marital status: Married    Spouse name: Not on file   Number of children: Not on file   Years of education: Not on file   Highest education level: Not on file  Occupational History   Not on file  Tobacco Use   Smoking status: Former    Packs/day: 1.00    Years: 33.00    Pack years: 33.00    Types: Cigarettes    Quit date: 03/13/2010    Years since quitting: 10.6   Smokeless tobacco: Never  Vaping Use   Vaping Use: Never used  Substance and Sexual Activity   Alcohol use: No    Comment: Former drinker (stopped in 2012)   Drug use: No   Sexual activity: Yes  Other Topics Concern   Not on file  Social History Narrative   Not on file   Social Determinants of Health   Financial Resource Strain: Not on file  Food Insecurity: Not on file  Transportation Needs: Not on file  Physical Activity: Not on file  Stress: Not on file  Social Connections: Not on file  Intimate Partner Violence: Not on file    History reviewed. No  pertinent family history.  Current Facility-Administered Medications  Medication Dose Route Frequency Provider Last Rate Last Admin   acetaminophen (TYLENOL) suppository 650 mg  650 mg Rectal Q6H PRN Michael Boston, MD       alum & mag hydroxide-simeth (MAALOX/MYLANTA) 200-200-20 MG/5ML suspension 30 mL  30 mL Oral Q6H PRN Michael Boston, MD       bisacodyl (DULCOLAX) suppository 10 mg  10 mg Rectal Daily Michael Boston, MD       diatrizoate meglumine-sodium (GASTROGRAFIN) 66-10 % solution 90 mL  90 mL Per NG tube Once Michael Boston, MD       diphenhydrAMINE (BENADRYL) injection 12.5-25 mg  12.5-25 mg Intravenous Q6H PRN Michael Boston, MD       HYDROmorphone (DILAUDID) injection 0.5-2 mg  0.5-2 mg Intravenous Q2H PRN Michael Boston, MD       lactated ringers bolus 1,000 mL  1,000 mL Intravenous Once Michael Boston, MD       lactated ringers bolus 1,000 mL  1,000 mL Intravenous Q8H PRN Michael Boston, MD       lip balm (CARMEX) ointment 1 application  1 application Topical BID Michael Boston, MD       magic mouthwash  15 mL Oral QID PRN Michael Boston, MD       menthol-cetylpyridinium (CEPACOL) lozenge 3 mg  1 lozenge Oral PRN Michael Boston, MD       methocarbamol (ROBAXIN) 1,000 mg in dextrose 5 % 100 mL IVPB  1,000 mg Intravenous Q6H PRN Michael Boston, MD       metoprolol tartrate (LOPRESSOR) injection 5 mg  5 mg Intravenous Q6H PRN Michael Boston, MD       ondansetron [facility]) injection 4 mg  4 mg Intravenous Q6H PRN Michael Boston, MD       Or   ondansetron (ZOFRAN) 8 mg in sodium chloride 0.9 % 50 mL IVPB  8 mg Intravenous Q6H PRN Michael Boston, MD       phenol (CHLORASEPTIC) mouth spray 2 spray  2 spray Mouth/Throat PRN Michael Boston, MD       prochlorperazine (COMPAZINE) injection 5-10 mg  5-10 mg Intravenous Q4H PRN Michael Boston, MD       simethicone (MYLICON) [id number] suspension 80 mg  80 mg Oral QID PRN Michael Boston, MD       Current Outpatient Medications  Medication Sig Dispense Refill    Astragalus Extract POWD Take 2 tablets by mouth 2 (two) times daily.     Cholecalciferol (VITAMIN D3) 5000 units CAPS Take 5,000 Units by mouth daily.     Cyanocobalamin (VITAMIN B-12 PO) Take 1 tablet by mouth daily.     famotidine (PEPCID) 20 MG tablet Take 20 mg by mouth 2 (two) times daily.     gabapentin (NEURONTIN) 300 MG capsule Take 300 mg by mouth 3 (three) times daily as needed (neuropathy).     ibuprofen (ADVIL,MOTRIN) 800 MG tablet Take 800 mg by mouth every 8 (eight) hours as needed for moderate pain.     LACTOBACILLUS RHAMNOSUS, GG, PO Take 1 capsule by mouth 2 (two) times daily.     lipase/protease/amylase (CREON) 36000 UNITS CPEP capsule Take 36,000 Units by mouth 3 (three) times daily before meals.     loperamide (IMODIUM) 2 MG capsule Take 2 mg by mouth every 2 (two) hours as needed for diarrhea or loose stools.     Melatonin 10 MG CAPS Take 10 mg by mouth at bedtime.     metoCLOPramide (REGLAN) 10 MG tablet Take 10 mg by mouth daily as needed for nausea. Pt takes if Zofran ineffective     Nutritional Supplements (FIRST INTENTION PO) Take 2 capsules by mouth 3 (three) times daily.     ondansetron (ZOFRAN) 8 MG tablet Take 8 mg by mouth every 8 (eight) hours as needed for nausea or vomiting.     OVER THE COUNTER MEDICATION Take 1 drop by mouth 2 (two) times daily. [place] Gold 1200 liquid (1 drop=42.'9mg'$ )     OVER THE COUNTER MEDICATION Take 2-3 capsules by mouth 2 (two) times daily. "Curamed" supplement     prochlorperazine (COMPAZINE) 10 MG tablet Take 10 mg by mouth 3 (three) times daily as needed for nausea or vomiting.      rivaroxaban (XARELTO) 20 MG TABS tablet Take 20 mg by mouth daily with supper.     traMADol (ULTRAM) 50 MG tablet Take 50 mg by mouth every 4 (four) hours as needed for moderate pain.        Allergies  Allergen Reactions   Oxycodone Other (See Comments)    Abdominal pain  ROS:   All other systems reviewed & are negative except per HPI or as  noted below: Constitutional:  No fevers, chills, sweats.   Eyes:  No vision changes, No discharge HENT:  No sore throats, nasal drainage Lymph: No neck swelling, No bruising easily Pulmonary:  No cough, productive sputum CV: No orthopnea, PND  Patient walks 30 minutes for about 1 miles without difficulty.  No exertional chest/neck/shoulder/arm pain. GI:  No personal nor family history of inflammatory bowel disease, irritable bowel syndrome, allergy such as Celiac Sprue, dietary/dairy problems, colitis, ulcers nor gastritis.  No recent sick contacts/gastroenteritis.  No travel outside the country.  No changes in diet.  Usually moves his bowels once a day Renal: No UTIs, No hematuria Genital:  No drainage, bleeding, masses Musculoskeletal: No severe joint pain.  Good ROM major joints Skin:  No sores or lesions.  No rashes Heme/Lymph:  No easy bleeding.  No swollen lymph nodes Neuro: No focal weakness/numbness.  No seizures Psych: No suicidal ideation.  No hallucinations  BP 103/66 (BP Location: Right Arm)   Pulse 67   Temp 98.1 F (36.7 C) (Oral)   Resp 17   SpO2 100%   Physical Exam:  Constitutional: Thin but not cachectic.  Hygeine adequate.  Vitals signs as above.   Eyes: Pupils reactive, normal extraocular movements. Sclera nonicteric Neuro: CN II-XII intact.  No major focal sensory defects.  No major motor deficits. Lymph: No head/neck/groin lymphadenopathy Psych:  No severe agitation.  No severe anxiety.  Judgment & insight Adequate, Oriented x4, HENT: Normocephalic, Mucus membranes moist.  No thrush.   Neck: Supple, No tracheal deviation.  No obvious thyromegaly Chest: No pain to chest wall compression.  Good respiratory excursion.  No audible wheezing CV:  Pulses intact.  Regular rhythm.  No major extremity edema  Abdomen:  Flat Hernia: Not present. Diastasis recti: Not present.  Midline incision with no obvious hernia. Soft.   Mildly distended.  Nontender.  No hepatomegaly.   No splenomegaly  Gen:  Inguinal hernia: Not present.  Inguinal lymph nodes: without lymphadenopathy.    Rectal: (Deferred)  Ext: No obvious deformity or contracture.  Edema: Not present.  No cyanosis Skin: No major subcutaneous nodules.  Warm and dry Musculoskeletal: Severe joint rigidity not present.  No obvious clubbing.  No digital petechiae.     Results:   Labs: Results for orders placed or performed during the hospital encounter of 11/14/20 (from the past 48 hour(s))  CBC     Status: Abnormal   Collection Time: 11/14/20 11:08 AM  Result Value Ref Range   WBC 2.1 (L) 4.0 - 10.5 K/uL   RBC 3.03 (L) 4.22 - 5.81 MIL/uL   Hemoglobin 8.8 (L) 13.0 - 17.0 g/dL   HCT 28.4 (L) 39.0 - 52.0 %   MCV 93.7 80.0 - 100.0 fL   MCH 29.0 26.0 - 34.0 pg   MCHC 31.0 30.0 - 36.0 g/dL   RDW 17.0 (H) 11.5 - 15.5 %   Platelets 198 150 - 400 K/uL   nRBC 13.3 (H) 0.0 - 0.2 %    Comment: Performed at [facility], [place] [address], [place], [place] [address]  Differential     Status: Abnormal   Collection Time: 11/14/20 11:08 AM  Result Value Ref Range   Neutrophils Relative % 39 %   Neutro Abs 0.8 (L) 1.7 - 7.7 K/uL   Lymphocytes Relative 43 %   Lymphs Abs 0.9 0.7 - 4.0 K/uL  Monocytes Relative 15 %   Monocytes Absolute 0.3 0.1 - 1.0 K/uL   Eosinophils Relative 1 %   Eosinophils Absolute 0.0 0.0 - 0.5 K/uL   Basophils Relative 1 %   Basophils Absolute 0.0 0.0 - 0.1 K/uL   Immature Granulocytes 1 %   Abs Immature Granulocytes 0.02 0.00 - 0.07 K/uL   Schistocytes PRESENT    Tear Drop Cells PRESENT    Trudee Kuster Cells PRESENT    Tammy Sours Bodies PRESENT    Polychromasia PRESENT    Target Cells PRESENT    Ovalocytes PRESENT     Comment: Performed at [facility], [place] [address] [place], [place] [address]  Comprehensive metabolic panel     Status: Abnormal   Collection Time: 11/14/20 11:52 AM  Result Value Ref Range   Sodium 140 135 - 145 mmol/L    Potassium 4.1 3.5 - 5.1 mmol/L   Chloride 112 (H) 98 - 111 mmol/L   CO2 19 (L) 22 - 32 mmol/L   Glucose, Bld 123 (H) 70 - 99 mg/dL    Comment: Glucose reference range applies only to samples taken after fasting for at least 8 hours.   BUN 16 6 - 20 mg/dL   Creatinine, Ser 0.92 0.61 - 1.24 mg/dL   Calcium 9.7 8.9 - 10.3 mg/dL   Total Protein 8.4 (H) 6.5 - 8.1 g/dL   Albumin 3.7 3.5 - 5.0 g/dL   AST 78 (H) 15 - 41 U/L   ALT 79 (H) 0 - 44 U/L   Alkaline Phosphatase 255 (H) 38 - 126 U/L   Total Bilirubin 3.0 (H) 0.3 - 1.2 mg/dL   GFR, Estimated >60 >60 mL/min    Comment: (NOTE) Calculated using the CKD-EPI Creatinine Equation (2021)    Anion gap 9 5 - 15    Comment: Performed at [facility], [place] [address], [place], [place] [address]  Lipase, blood     Status: Abnormal   Collection Time: 11/14/20 11:52 AM  Result Value Ref Range   Lipase 207 (H) 11 - 51 U/L    Comment: Performed at [facility], [place] [address], [place], [place] [address]  Lactic acid, plasma     Status: Abnormal   Collection Time: 11/14/20 11:52 AM  Result Value Ref Range   Lactic Acid, Venous 2.3 (HH) 0.5 - 1.9 mmol/L    Comment: CRITICAL RESULT CALLED TO, READ BACK BY AND VERIFIED WITH: Q,BONIBD AT 1248 ON 11/14/20 BY A,MOHAMED Performed at [facility] [address] [place], [place] [address]     Imaging / Studies: CT Abdomen Pelvis W Contrast  Result Date: 11/14/2020 CLINICAL DATA:  Nausea/vomiting EXAM: CT ABDOMEN AND PELVIS WITH CONTRAST TECHNIQUE: Multidetector CT imaging of the abdomen and pelvis was performed using the standard protocol following bolus administration of intravenous contrast. CONTRAST:  5m OMNIPAQUE IOHEXOL 350 MG/ML SOLN COMPARISON:  None. FINDINGS: Lower chest: No acute abnormality. Hepatobiliary: There are multiple new hypodense liver lesions, many of which with peripheral enhancement, largest measuring 6.6 cm in the central  liver (series 2, image 13). Prior cholecystectomy. Pancreas: Prior distal pancreatectomy and splenectomy. Residual pancreatic head tissue noted and unremarkable. Spleen: Splenectomy. Adrenals/Urinary Tract: Adrenal glands are unremarkable. Kidneys are normal, without renal calculi, focal lesion, or hydronephrosis. Bladder is unremarkable. Stomach/Bowel: Dilated stomach and duodenum. There is an abrupt transition point at the duodenal jejunal junction (sagittal image 102). There is wall thickening of the colon at the hepatic  flexure with mild adjacent stranding. The appendix is normal. Vascular/Lymphatic: Aortoiliac atherosclerotic calcifications. No AAA. There is severe narrowing of the portal vein and superior mesenteric vein with some collateral vessels noted. The celiac trunk is either completely obstructed external compression or there is a common celiac trunk and superior mesenteric artery. There is a hepatic artery arising from the SMA, likely right hepatic artery, which appears severely narrowed coursing through this soft tissue. Reproductive: Unremarkable. Other: Prominent soft tissue density in the lesser sac, surrounding the superior mesenteric artery takeoff, and encasing the proximal portal vein and portal-SMV confluence, severely narrowing these vessels. This soft tissue abuts the lesser curvature of the stomach in the left adrenal gland. Musculoskeletal: No acute osseous abnormality. No suspicious lytic or blastic lesions. There is a nodular density in the subcutaneous tissues of the right hemiabdomen measuring 7 mm (series 2, image 42). IMPRESSION: High-grade bowel obstruction with marked dilation of the stomach and duodenum, abrupt transition point at the duodenal-jejunal junction. This is either due to adhesions or metastatic disease. There is prominent soft tissue density in the lesser sac, encasing the superior mesenteric artery takeoff, portal vein and portal-SMV confluence, and right hepatic  artery, consistent with metastatic disease. The celiac trunk is not visualized, either obstructed by external compression or there is a common trunk of the celiac and SMA. The SMA is patent. Severe narrowing of the proximal portal vein and portal-SMV confluence. Severe narrowing of the right hepatic artery which appears to arise from the SMA. Multiple hypodense liver lesions, new from prior exam in August 2018, consistent with metastatic disease. Largest lesion measures up to 6.6 cm in the central liver, which demonstrates lower density than several other lesions and may represent a necrotic metastasis, possibly treatment related. Correlation with any more recent prior imaging, if available, would be useful. Nodular soft tissue density in the subcutaneous tissues along the right hemiabdomen measuring 7 mm, again correlation with prior exams would be useful. Focal colonic wall thickening with adjacent stranding at the hepatic flexure, consistent with mild focal colitis. These results were called by telephone at the time of interpretation on 11/14/2020 at 1:30 pm to provider HALEY SAGE , who verbally acknowledged these results. Electronically Signed   By: Maurine Simmering M.D.   On: 11/14/2020 13:46    Medications / Allergies: per chart  Antibiotics: Anti-infectives (From admission, onward)    None         Note: Portions of this report may have been transcribed using voice recognition software. Every effort was made to ensure accuracy; however, inadvertent computerized transcription errors may be present.   Any transcriptional errors that result from this process are unintentional.    Adin Hector, MD, FACS, MASCRS Esophageal, Gastrointestinal & Colorectal Surgery Robotic and Minimally Invasive Surgery  [facility] [place]  [place]. [address] [place], [place] [address] [phone] Fax [phone] Main  CONTACT  INFORMATION:  Weekday (9AM-5PM): Call [facility] main office at [phone]  Weeknight (5PM-9AM) or Weekend/Holiday: Check [email or web address] (password " [email or web address]") for General Surgery [facility] coverage  (Please, do not use SecureChat as it is not reliable communication to operating surgeons for immediate patient care)        11/14/2020  2:33 PM

## 2020-11-14 NOTE — H&P (Addendum)
History and Physical    Frank Garrett [id number] DOB: 26-Oct-1960 DOA: 11/14/2020  PCP: Gregor Hams, FNP  Patient coming from: Home  Chief Complaint: vomitting  HPI: Frank Garrett is a 60 y.o. male with medical history significant of pancreatic cancer w/ mets, GERD, chronic nausea. Present with intractable N/V. He was in his normal state of health until yesterday. Yesterday evening he has acute vomiting after eating. There was no blood. It was non-bilious. He tried zofran, but he couldn't keep it down. He had umbilical, non-radiating, crampy abdominal pain. His symptoms continued through this morning. He became concerned and came to the ED. He denies any other aggravating or alleviating factors.    ED Course: CT ab/pelvis showed high grade SBO probably secondary to tumor or adhesion. General surgery was consulted. They recommended NGT placement. TRH was called for admission.   Review of Systems:  Review of systems is otherwise negative for all not mentioned in HPI.   PMHx Past Medical History:  Diagnosis Date   Anemia    Cancer ([place]) 04/2016   Pancreatic Adenocarcinoma   GERD (gastroesophageal reflux disease)    Hypertension    Pancreas cancer [facility])    Pancreatic cancer s/p radical distal pancreaticosplenectomy with resection of celiac axis, SMV, portal vein repair, lateral venorrhaphy, abdominal lymph node dissection, cholecystectomy on 10/19/2016 at [place].    PSHx Past Surgical History:  Procedure Laterality Date   CHOLECYSTECTOMY  10/19/2016   DISTAL PANCREATECTOMY  10/19/2016   Pancreatic cancer s/p radical distal pancreaticosplenectomy with resection of celiac axis, SMV, portal vein repair, lateral venorrhaphy, abdominal lymph node dissection, cholecystectomy on 10/19/2016 at [place].   SPLENECTOMY, TOTAL  10/19/2016    SocHx  reports that he quit smoking about 10 years ago. He has a 33.00 pack-year smoking  history. He has never used smokeless tobacco. He reports that he does not drink alcohol and does not use drugs.  Allergies  Allergen Reactions   Oxycodone Other (See Comments)    Abdominal pain     FamHx History reviewed. No pertinent family history.  Prior to Admission medications   Medication Sig Start Date End Date Taking? Authorizing Provider  Astragalus Extract POWD Take 2 tablets by mouth 2 (two) times daily.    [provider]  Cholecalciferol (VITAMIN D3) 5000 units CAPS Take 5,000 Units by mouth daily.    [provider]  Cyanocobalamin (VITAMIN B-12 PO) Take 1 tablet by mouth daily.    [provider]  famotidine (PEPCID) 20 MG tablet Take 20 mg by mouth 2 (two) times daily.    [provider]  gabapentin (NEURONTIN) 300 MG capsule Take 300 mg by mouth 3 (three) times daily as needed (neuropathy).    [provider]  ibuprofen (ADVIL,MOTRIN) 800 MG tablet Take 800 mg by mouth every 8 (eight) hours as needed for moderate pain.    [provider]  LACTOBACILLUS RHAMNOSUS, GG, PO Take 1 capsule by mouth 2 (two) times daily.    [provider]  lipase/protease/amylase (CREON) 36000 UNITS CPEP capsule Take 36,000 Units by mouth 3 (three) times daily before meals.    [provider]  loperamide (IMODIUM) 2 MG capsule Take 2 mg by mouth every 2 (two) hours as needed for diarrhea or loose stools.    [provider]  Melatonin 10 MG CAPS Take 10 mg by mouth at bedtime.    [provider]  metoCLOPramide (REGLAN) 10 MG  tablet Take 10 mg by mouth daily as needed for nausea. Pt takes if Zofran ineffective 10/23/16   [provider]  Nutritional Supplements (FIRST INTENTION PO) Take 2 capsules by mouth 3 (three) times daily.    [provider]  ondansetron (ZOFRAN) 8 MG tablet Take 8 mg by mouth every 8 (eight) hours as needed for nausea or vomiting.    [provider]  OVER THE  COUNTER MEDICATION Take 1 drop by mouth 2 (two) times daily. [place] Gold 1200 liquid (1 drop=42.'9mg'$ )    [provider]  OVER THE COUNTER MEDICATION Take 2-3 capsules by mouth 2 (two) times daily. "Curamed" supplement    [provider]  prochlorperazine (COMPAZINE) 10 MG tablet Take 10 mg by mouth 3 (three) times daily as needed for nausea or vomiting.  06/01/16   [provider]  rivaroxaban (XARELTO) 20 MG TABS tablet Take 20 mg by mouth daily with supper.    [provider]  traMADol (ULTRAM) 50 MG tablet Take 50 mg by mouth every 4 (four) hours as needed for moderate pain.  05/24/16   [provider]    Physical Exam: Vitals:   11/14/20 1312 11/14/20 1330 11/14/20 1423 11/14/20 1424  BP:  115/81 103/66 103/66  Pulse: (!) (P) 7 72 80 67  Resp:  '18 16 17  '$ Temp:      TempSrc:      SpO2:  100% 100% 100%    General: 60 y.o. ill appearig male resting in bed in NAD Eyes: PERRL, normal sclera ENMT: Nares patent w/o discharge, orophaynx clear, dentition normal, ears w/o discharge/lesions/ulcers Neck: Supple, trachea midline Cardiovascular: RRR, +S1, S2, no m/g/r, equal pulses throughout Respiratory: CTABL, no w/r/r, normal WOB GI: BS hypoactive, NDNT, no masses noted, no organomegaly noted MSK: No e/c/c Skin: No rashes, bruises, ulcerations noted Neuro: A&O x 3, no focal deficits Psyc: Appropriate interaction and affect, calm/cooperative  Labs on Admission: I have personally reviewed following labs and imaging studies  CBC: Recent Labs  Lab 11/14/20 1108  WBC 2.1*  NEUTROABS 0.8*  HGB 8.8*  HCT 28.4*  MCV 93.7  PLT 99991111   Basic Metabolic Panel: Recent Labs  Lab 11/14/20 1152  NA 140  K 4.1  CL 112*  CO2 19*  GLUCOSE 123*  BUN 16  CREATININE 0.92  CALCIUM 9.7   GFR: CrCl cannot be calculated (Unknown ideal weight.). Liver Function Tests: Recent Labs  Lab 11/14/20 1152  AST 78*  ALT 79*  ALKPHOS 255*  BILITOT 3.0*   PROT 8.4*  ALBUMIN 3.7   Recent Labs  Lab 11/14/20 1152  LIPASE 207*   No results for input(s): AMMONIA in the last 168 hours. Coagulation Profile: No results for input(s): INR, PROTIME in the last 168 hours. Cardiac Enzymes: No results for input(s): CKTOTAL, CKMB, CKMBINDEX, TROPONINI in the last 168 hours. BNP (last 3 results) No results for input(s): PROBNP in the last 8760 hours. HbA1C: No results for input(s): HGBA1C in the last 72 hours. CBG: No results for input(s): GLUCAP in the last 168 hours. Lipid Profile: No results for input(s): CHOL, HDL, LDLCALC, TRIG, CHOLHDL, LDLDIRECT in the last 72 hours. Thyroid Function Tests: No results for input(s): TSH, T4TOTAL, FREET4, T3FREE, THYROIDAB in the last 72 hours. Anemia Panel: No results for input(s): VITAMINB12, FOLATE, FERRITIN, TIBC, IRON, RETICCTPCT in the last 72 hours. Urine analysis:    Component Value Date/Time   COLORURINE YELLOW 08/25/2020 2226   APPEARANCEUR HAZY (A) 08/25/2020  2226   LABSPEC 1.016 08/25/2020 2226   PHURINE 5.0 08/25/2020 2226   GLUCOSEU NEGATIVE 08/25/2020 2226   HGBUR SMALL (A) 08/25/2020 2226   BILIRUBINUR NEGATIVE 08/25/2020 2226   KETONESUR NEGATIVE 08/25/2020 2226   PROTEINUR NEGATIVE 08/25/2020 2226   UROBILINOGEN 2.0 (H) 08/20/2006 1249   NITRITE NEGATIVE 08/25/2020 2226   LEUKOCYTESUR NEGATIVE 08/25/2020 2226    Radiological Exams on Admission: CT Abdomen Pelvis W Contrast  Result Date: 11/14/2020 CLINICAL DATA:  Nausea/vomiting EXAM: CT ABDOMEN AND PELVIS WITH CONTRAST TECHNIQUE: Multidetector CT imaging of the abdomen and pelvis was performed using the standard protocol following bolus administration of intravenous contrast. CONTRAST:  81m OMNIPAQUE IOHEXOL 350 MG/ML SOLN COMPARISON:  None. FINDINGS: Lower chest: No acute abnormality. Hepatobiliary: There are multiple new hypodense liver lesions, many of which with peripheral enhancement, largest measuring 6.6 cm in the central  liver (series 2, image 13). Prior cholecystectomy. Pancreas: Prior distal pancreatectomy and splenectomy. Residual pancreatic head tissue noted and unremarkable. Spleen: Splenectomy. Adrenals/Urinary Tract: Adrenal glands are unremarkable. Kidneys are normal, without renal calculi, focal lesion, or hydronephrosis. Bladder is unremarkable. Stomach/Bowel: Dilated stomach and duodenum. There is an abrupt transition point at the duodenal jejunal junction (sagittal image 102). There is wall thickening of the colon at the hepatic flexure with mild adjacent stranding. The appendix is normal. Vascular/Lymphatic: Aortoiliac atherosclerotic calcifications. No AAA. There is severe narrowing of the portal vein and superior mesenteric vein with some collateral vessels noted. The celiac trunk is either completely obstructed external compression or there is a common celiac trunk and superior mesenteric artery. There is a hepatic artery arising from the SMA, likely right hepatic artery, which appears severely narrowed coursing through this soft tissue. Reproductive: Unremarkable. Other: Prominent soft tissue density in the lesser sac, surrounding the superior mesenteric artery takeoff, and encasing the proximal portal vein and portal-SMV confluence, severely narrowing these vessels. This soft tissue abuts the lesser curvature of the stomach in the left adrenal gland. Musculoskeletal: No acute osseous abnormality. No suspicious lytic or blastic lesions. There is a nodular density in the subcutaneous tissues of the right hemiabdomen measuring 7 mm (series 2, image 42). IMPRESSION: High-grade bowel obstruction with marked dilation of the stomach and duodenum, abrupt transition point at the duodenal-jejunal junction. This is either due to adhesions or metastatic disease. There is prominent soft tissue density in the lesser sac, encasing the superior mesenteric artery takeoff, portal vein and portal-SMV confluence, and right hepatic  artery, consistent with metastatic disease. The celiac trunk is not visualized, either obstructed by external compression or there is a common trunk of the celiac and SMA. The SMA is patent. Severe narrowing of the proximal portal vein and portal-SMV confluence. Severe narrowing of the right hepatic artery which appears to arise from the SMA. Multiple hypodense liver lesions, new from prior exam in August 2018, consistent with metastatic disease. Largest lesion measures up to 6.6 cm in the central liver, which demonstrates lower density than several other lesions and may represent a necrotic metastasis, possibly treatment related. Correlation with any more recent prior imaging, if available, would be useful. Nodular soft tissue density in the subcutaneous tissues along the right hemiabdomen measuring 7 mm, again correlation with prior exams would be useful. Focal colonic wall thickening with adjacent stranding at the hepatic flexure, consistent with mild focal colitis. These results were called by telephone at the time of interpretation on 11/14/2020 at 1:30 pm to provider HALEY SAGE , who verbally acknowledged these results. Electronically Signed  By: Maurine Simmering M.D.   On: 11/14/2020 13:46    EKG: None obtained in ED  Assessment/Plan SBO Intractable N/V Lactic acidosis     - admit to inpt, med-surg     - NGT placement     - SBO protocol     - gen surg onboard, appreciate assistance, will await final recs     - fluids, anti-emetics  Metastatic pancreatic CA Elevated lipase, LFTs     - his care is with [facility] in [place]     - recently seen; 2 weeks ago, per pt report, starting an oral chemo therapy     - get records     - add palliative consult  Normocytic anemia     - no evidence of bleed     - check iron studies, follow  COVID positive     - no respiratory symptoms, fevers     - satting 100% RA     - cycle threshold is 33.6; let's follow some inflammatory markers; if they  start to trend up, consider remdes  Hx of PE on xarelto     - tx dose lovenox while NPO  DVT prophylaxis: tx dose lovenox Code Status: FULL  Family Communication: w/ wife at bedside  Consults called: General surgery (Dr. Johney Maine)   Status is: Inpatient  Remains inpatient appropriate because:Inpatient level of care appropriate due to severity of illness  Dispo: The patient is from: Home              Anticipated d/c is to: Home              Patient currently is not medically stable to d/c.   Difficult to place patient No  Time spent coordinating admission: 60 minutes  [facility]  If 7PM-7AM, please contact night-coverage [email or web address]  11/14/2020, 2:27 PM   \

## 2020-11-14 NOTE — Plan of Care (Signed)
  Problem: Health Behavior/Discharge Planning: Goal: Ability to manage health-related needs will improve Outcome: Progressing   Problem: Clinical Measurements: Goal: Ability to maintain clinical measurements within normal limits will improve Outcome: Progressing Goal: Will remain free from infection Outcome: Progressing Goal: Diagnostic test results will improve Outcome: Progressing   

## 2020-11-15 ENCOUNTER — Inpatient Hospital Stay (HOSPITAL_COMMUNITY): Payer: Commercial Managed Care - PPO

## 2020-11-15 DIAGNOSIS — Z7189 Other specified counseling: Secondary | ICD-10-CM

## 2020-11-15 DIAGNOSIS — Z515 Encounter for palliative care: Secondary | ICD-10-CM

## 2020-11-15 DIAGNOSIS — Z978 Presence of other specified devices: Secondary | ICD-10-CM

## 2020-11-15 DIAGNOSIS — R1084 Generalized abdominal pain: Secondary | ICD-10-CM

## 2020-11-15 LAB — CBC WITH DIFFERENTIAL/PLATELET
Abs Immature Granulocytes: 0.01 10*3/uL (ref 0.00–0.07)
Basophils Absolute: 0 10*3/uL (ref 0.0–0.1)
Basophils Relative: 1 %
Eosinophils Absolute: 0.1 10*3/uL (ref 0.0–0.5)
Eosinophils Relative: 3 %
HCT: 25.2 % — ABNORMAL LOW (ref 39.0–52.0)
Hemoglobin: 8 g/dL — ABNORMAL LOW (ref 13.0–17.0)
Immature Granulocytes: 0 %
Lymphocytes Relative: 45 %
Lymphs Abs: 1.1 10*3/uL (ref 0.7–4.0)
MCH: 29.4 pg (ref 26.0–34.0)
MCHC: 31.7 g/dL (ref 30.0–36.0)
MCV: 92.6 fL (ref 80.0–100.0)
Monocytes Absolute: 0.6 10*3/uL (ref 0.1–1.0)
Monocytes Relative: 22 %
Neutro Abs: 0.7 10*3/uL — ABNORMAL LOW (ref 1.7–7.7)
Neutrophils Relative %: 29 %
Platelets: 191 10*3/uL (ref 150–400)
RBC: 2.72 MIL/uL — ABNORMAL LOW (ref 4.22–5.81)
RDW: 17.4 % — ABNORMAL HIGH (ref 11.5–15.5)
WBC: 2.5 10*3/uL — ABNORMAL LOW (ref 4.0–10.5)
nRBC: 12.1 % — ABNORMAL HIGH (ref 0.0–0.2)

## 2020-11-15 LAB — BASIC METABOLIC PANEL
Anion gap: 9 (ref 5–15)
BUN: 18 mg/dL (ref 6–20)
CO2: 24 mmol/L (ref 22–32)
Calcium: 9.4 mg/dL (ref 8.9–10.3)
Chloride: 109 mmol/L (ref 98–111)
Creatinine, Ser: 1.01 mg/dL (ref 0.61–1.24)
GFR, Estimated: >60 mL/min (ref >60)
Glucose, Bld: 95 mg/dL (ref 70–99)
Potassium: 3.6 mmol/L (ref 3.5–5.1)
Sodium: 142 mmol/L (ref 135–145)

## 2020-11-15 LAB — HIV ANTIBODY (ROUTINE TESTING W REFLEX): HIV Screen 4th Generation wRfx: NONREACTIVE

## 2020-11-15 LAB — C-REACTIVE PROTEIN: CRP: 1.1 mg/dL — ABNORMAL HIGH (ref <1.0)

## 2020-11-15 LAB — FERRITIN: Ferritin: 668 ng/mL — ABNORMAL HIGH (ref 24–336)

## 2020-11-15 LAB — D-DIMER, QUANTITATIVE: D-Dimer, Quant: 5.3 ug/mL-FEU — ABNORMAL HIGH (ref 0.00–0.50)

## 2020-11-15 NOTE — Progress Notes (Signed)
NGT placement confirmed.  Gastrogaffin administered @ 0100. 1 hour check done, suction restarted at 0200.   Pt tolerated med well. Pt stated he felt his "stomach bubbling" but did not have a BM.    KUB xray will be completed after 8hours, order set for 10AM 11/15/20

## 2020-11-15 NOTE — Progress Notes (Signed)
PROGRESS NOTE    Frank Garrett  [id number] DOB: 11-Mar-1961 DOA: 11/14/2020 PCP: Gregor Hams, FNP    Brief Narrative:  60 year old male with a history of metastatic pancreatic cancer, admitted to the hospital with intractable nausea and vomiting.  He was found to have small bowel obstruction and was admitted for further management.  He had NG tube placed for decompression.  General surgery consulted.   Assessment & Plan:   Principal Problem:   SBO (small bowel obstruction) (HCC) Active Problems:   Nausea and vomiting   Anemia of chronic disease   Asplenia   Pancreas cancer (HCC)   Chronic anticoagulation   Liver metastasis from pancreas cancer   Neutropenia (HCC)   On antineoplastic chemotherapy   Small bowel obstruction -Currently has NG tube for decompression -SBO protocol -General surgery following  Metastatic pancreatic cancer -Patient receives his care with [facility] [place] in [place] -Palliative care consulted, but patient did not wish to discuss goals of care  COVID-positive -No respiratory symptoms or fevers -Following inflammatory markers -Treat supportively at this time  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 -Continue therapeutic Lovenox  Right shoulder pain -Suspect this may be related to muscle strain, trapezius -We will try Robaxin   DVT prophylaxis:   Therapeutic Lovenox  Code Status: Full code Family Communication: Discussed with wife at the bedside Disposition Plan: Status is: Inpatient  Remains inpatient appropriate because:Ongoing diagnostic testing needed not appropriate for outpatient work up, IV treatments appropriate due to intensity of illness or inability to take PO, and Inpatient level of care appropriate due to severity of illness  Dispo: The patient is from: Home              Anticipated d/c is to: Home              Patient currently is not medically stable to d/c.    Difficult to place patient No         Consultants:  General surgery Palliative care  Procedures:    Antimicrobials:      Subjective: Reports that abdominal pain and distention has improved since NG decompression.  He has not had any nausea or vomiting.  He did have 1 loose stool last night, but has not passed any gas since then.  He does complain of right shoulder pain which has been intermittent for quite some time now.  Objective: Vitals:   11/14/20 1839 11/14/20 2200 11/15/20 0200 11/15/20 1058  BP:  116/76 104/73 119/82  Pulse:  71 69 85  Resp:  '17 16 16  '$ Temp:  99.3 F (37.4 C) 98.6 F (37 C) 98.2 F (36.8 C)  TempSrc:  Oral Oral Oral  SpO2:  100% 99% 100%  Weight: 57.7 kg     Height: '5\' 8"'$  (1.727 m)       Intake/Output Summary (Last 24 hours) at 11/15/2020 2011 Last data filed at 11/15/2020 2000 Gross per 24 hour  Intake 1834 ml  Output 2800 ml  Net -966 ml   Filed Weights   11/14/20 1839  Weight: 57.7 kg    Examination:  General exam: Appears calm and comfortable, NG tube in place Respiratory system: Clear to auscultation. Respiratory effort normal. Cardiovascular system: S1 & S2 heard, RRR. No JVD, murmurs, rubs, gallops or clicks. No pedal edema. Gastrointestinal system: Abdomen is distended, soft and nontender. No organomegaly or masses felt. Normal bowel sounds sluggish Central nervous system: Alert and oriented. No focal  neurological deficits. Extremities: Symmetric 5 x 5 power. Skin: No rashes, lesions or ulcers Psychiatry: Judgement and insight appear normal. Mood & affect appropriate.     Data Reviewed: I have personally reviewed following labs and imaging studies  CBC: Recent Labs  Lab 11/14/20 1108 11/15/20 0325  WBC 2.1* 2.5*  NEUTROABS 0.8* 0.7*  HGB 8.8* 8.0*  HCT 28.4* 25.2*  MCV 93.7 92.6  PLT 198 [id number]   Basic Metabolic Panel: Recent Labs  Lab 11/14/20 1152 11/15/20 0325  NA 140 142  K 4.1 3.6  CL 112* 109  CO2 19*  24  GLUCOSE 123* 95  BUN 16 18  CREATININE 0.92 1.01  CALCIUM 9.7 9.4   GFR: Estimated Creatinine Clearance: 65.1 mL/min (by C-G formula based on SCr of 1.01 mg/dL). Liver Function Tests: Recent Labs  Lab 11/14/20 1152  AST 78*  ALT 79*  ALKPHOS 255*  BILITOT 3.0*  PROT 8.4*  ALBUMIN 3.7   Recent Labs  Lab 11/14/20 1152  LIPASE 207*   No results for input(s): AMMONIA in the last 168 hours. Coagulation Profile: No results for input(s): INR, PROTIME in the last 168 hours. Cardiac Enzymes: No results for input(s): CKTOTAL, CKMB, CKMBINDEX, TROPONINI in the last 168 hours. BNP (last 3 results) No results for input(s): PROBNP in the last 8760 hours. HbA1C: No results for input(s): HGBA1C in the last 72 hours. CBG: No results for input(s): GLUCAP in the last 168 hours. Lipid Profile: No results for input(s): CHOL, HDL, LDLCALC, TRIG, CHOLHDL, LDLDIRECT in the last 72 hours. Thyroid Function Tests: No results for input(s): TSH, T4TOTAL, FREET4, T3FREE, THYROIDAB in the last 72 hours. Anemia Panel: Recent Labs    11/15/20 0325  FERRITIN 668*   Sepsis Labs: Recent Labs  Lab 11/14/20 1152  LATICACIDVEN 2.3*    Recent Results (from the past 240 hour(s))  Resp Panel by RT-PCR (Flu A&B, Covid) Nasopharyngeal Swab     Status: Abnormal   Collection Time: 11/14/20 12:58 PM   Specimen: Nasopharyngeal Swab; Nasopharyngeal(NP) swabs in vial transport medium  Result Value Ref Range Status   SARS Coronavirus 2 by RT PCR POSITIVE (A) NEGATIVE Final    Comment: RESULT CALLED TO, READ BACK BY AND VERIFIED WITH: Lenn Cal, RN [phone] KDS (NOTE) SARS-CoV-2 target nucleic acids are DETECTED.  The SARS-CoV-2 RNA is generally detectable in upper respiratory specimens during the acute phase of infection. Positive results are indicative of the presence of the identified virus, but do not rule out bacterial infection or co-infection with other pathogens not detected by the test.  Clinical correlation with patient history and other diagnostic information is necessary to determine patient infection status. The expected result is Negative.  Fact Sheet for Patients: EntrepreneurPulse.com.au  Fact Sheet for Healthcare Providers: IncredibleEmployment.be  This test is not yet approved or cleared by the [place] FDA and  has been authorized for detection and/or diagnosis of SARS-CoV-2 by FDA under an Emergency Use Authorization (EUA).  This EUA will remain in effect (meaning this test can be used)  for the duration of  the COVID-19 declaration under Section 564(b)(1) of the Act, 21 U.S.C. section 360bbb-3(b)(1), unless the authorization is terminated or revoked sooner.     Influenza A by PCR NEGATIVE NEGATIVE Final   Influenza B by PCR NEGATIVE NEGATIVE Final    Comment: (NOTE) The Xpert Xpress SARS-CoV-2/FLU/RSV plus assay is intended as an aid in the diagnosis of influenza from Nasopharyngeal swab specimens and should not be used as  a sole basis for treatment. Nasal washings and aspirates are unacceptable for Xpert Xpress SARS-CoV-2/FLU/RSV testing.  Fact Sheet for Patients: EntrepreneurPulse.com.au  Fact Sheet for Healthcare Providers: IncredibleEmployment.be  This test is not yet approved or cleared by the [place] FDA and has been authorized for detection and/or diagnosis of SARS-CoV-2 by FDA under an Emergency Use Authorization (EUA). This EUA will remain in effect (meaning this test can be used) for the duration of the COVID-19 declaration under Section 564(b)(1) of the Act, 21 U.S.C. section 360bbb-3(b)(1), unless the authorization is terminated or revoked.  Performed at [facility], [place] [address], [place], [place] [address]          Radiology Studies: CT Abdomen Pelvis W Contrast  Result Date: 11/14/2020 CLINICAL DATA:  Nausea/vomiting  EXAM: CT ABDOMEN AND PELVIS WITH CONTRAST TECHNIQUE: Multidetector CT imaging of the abdomen and pelvis was performed using the standard protocol following bolus administration of intravenous contrast. CONTRAST:  89m OMNIPAQUE IOHEXOL 350 MG/ML SOLN COMPARISON:  None. FINDINGS: Lower chest: No acute abnormality. Hepatobiliary: There are multiple new hypodense liver lesions, many of which with peripheral enhancement, largest measuring 6.6 cm in the central liver (series 2, image 13). Prior cholecystectomy. Pancreas: Prior distal pancreatectomy and splenectomy. Residual pancreatic head tissue noted and unremarkable. Spleen: Splenectomy. Adrenals/Urinary Tract: Adrenal glands are unremarkable. Kidneys are normal, without renal calculi, focal lesion, or hydronephrosis. Bladder is unremarkable. Stomach/Bowel: Dilated stomach and duodenum. There is an abrupt transition point at the duodenal jejunal junction (sagittal image 102). There is wall thickening of the colon at the hepatic flexure with mild adjacent stranding. The appendix is normal. Vascular/Lymphatic: Aortoiliac atherosclerotic calcifications. No AAA. There is severe narrowing of the portal vein and superior mesenteric vein with some collateral vessels noted. The celiac trunk is either completely obstructed external compression or there is a common celiac trunk and superior mesenteric artery. There is a hepatic artery arising from the SMA, likely right hepatic artery, which appears severely narrowed coursing through this soft tissue. Reproductive: Unremarkable. Other: Prominent soft tissue density in the lesser sac, surrounding the superior mesenteric artery takeoff, and encasing the proximal portal vein and portal-SMV confluence, severely narrowing these vessels. This soft tissue abuts the lesser curvature of the stomach in the left adrenal gland. Musculoskeletal: No acute osseous abnormality. No suspicious lytic or blastic lesions. There is a nodular density  in the subcutaneous tissues of the right hemiabdomen measuring 7 mm (series 2, image 42). IMPRESSION: High-grade bowel obstruction with marked dilation of the stomach and duodenum, abrupt transition point at the duodenal-jejunal junction. This is either due to adhesions or metastatic disease. There is prominent soft tissue density in the lesser sac, encasing the superior mesenteric artery takeoff, portal vein and portal-SMV confluence, and right hepatic artery, consistent with metastatic disease. The celiac trunk is not visualized, either obstructed by external compression or there is a common trunk of the celiac and SMA. The SMA is patent. Severe narrowing of the proximal portal vein and portal-SMV confluence. Severe narrowing of the right hepatic artery which appears to arise from the SMA. Multiple hypodense liver lesions, new from prior exam in August 2018, consistent with metastatic disease. Largest lesion measures up to 6.6 cm in the central liver, which demonstrates lower density than several other lesions and may represent a necrotic metastasis, possibly treatment related. Correlation with any more recent prior imaging, if available, would be useful. Nodular soft tissue density in the subcutaneous tissues along the right hemiabdomen measuring 7 mm,  again correlation with prior exams would be useful. Focal colonic wall thickening with adjacent stranding at the hepatic flexure, consistent with mild focal colitis. These results were called by telephone at the time of interpretation on 11/14/2020 at 1:30 pm to provider HALEY SAGE , who verbally acknowledged these results. Electronically Signed   By: Maurine Simmering M.D.   On: 11/14/2020 13:46   DG Abd Portable 1V-Small Bowel Obstruction Protocol-initial, 8 hr delay  Result Date: 11/15/2020 CLINICAL DATA:  8 hour delay image for small bowel obstruction. EXAM: PORTABLE ABDOMEN - 1 VIEW COMPARISON:  Radiographs 11/15/2020 and 11/14/2020.  CT 11/14/2020. FINDINGS: 0912  hours. No enteric contrast is visible on the recent prior studies. Nasogastric tube is looped in the proximal stomach which appears decompressed. There is faint contrast material within the duodenum which remains markedly dilated. Some contrast has passed into the colon which is normal in caliber. There is residual faint contrast material in the bladder from previous CT. No extravasated contrast or signs of free air. The bones appear unremarkable. IMPRESSION: Some contrast has passed into the colon which is normal in caliber. However, there is persistent significant dilatation of the duodenum consistent with a high-grade partial bowel obstruction as seen on CT. Electronically Signed   By: Richardean Sale M.D.   On: 11/15/2020 09:31   DG Abd Portable 1V  Result Date: 11/15/2020 CLINICAL DATA:  NG tube position EXAM: PORTABLE ABDOMEN - 1 VIEW COMPARISON:  11/14/2020 FINDINGS: NG tube coils in the fundus of the stomach, unchanged since prior study. IMPRESSION: NG tube continues to coil in the fundus of the stomach. Electronically Signed   By: Rolm Baptise M.D.   On: 11/15/2020 00:59   DG Abd Portable 1V  Result Date: 11/14/2020 CLINICAL DATA:  NG tube EXAM: PORTABLE ABDOMEN - 1 VIEW COMPARISON:  11/14/2020 FINDINGS: NG tube coils in the fundus of the stomach. IMPRESSION: NG tube in the fundus of the stomach. Electronically Signed   By: Rolm Baptise M.D.   On: 11/14/2020 23:45   DG Abd Portable 1V-Small Bowel Protocol-Position Verification  Result Date: 11/14/2020 CLINICAL DATA:  NG tube placement. EXAM: PORTABLE ABDOMEN - 1 VIEW COMPARISON:  CT earlier today. FINDINGS: Tip and side port of the enteric tube below the diaphragm in the stomach. No gaseous distention of bowel loops in the upper abdomen. Excreted IV contrast in the renal collecting systems. IMPRESSION: Tip and side port of the enteric tube below the diaphragm in the stomach. Electronically Signed   By: Keith Rake M.D.   On: 11/14/2020 18:47         Scheduled Meds:  bisacodyl  10 mg Rectal Daily   enoxaparin (LOVENOX) injection  60 mg Subcutaneous Q12H   lip balm  1 application Topical BID   Continuous Infusions:  lactated ringers     methocarbamol (ROBAXIN) IV 1,000 mg (11/15/20 1801)   ondansetron (ZOFRAN) IV       LOS: 1 day    Time spent: 35 minutes    Kathie Dike, MD [facility]   If 7PM-7AM, please contact night-coverage [email or web address]  11/15/2020, 8:11 PM

## 2020-11-15 NOTE — Progress Notes (Signed)
Pt complained of nausea, left shoulder pain,and vomited 100 ml. Nurse assessed NGT from pt to suction canister, replaced canister, checked connections, and caps on canister. The suction appeared ineffective, increased the suction to medium and turned from intermittent to regular. Pt immediately got relief after NGT suctioned 1500 ml. He no longer felt nauseated and shoulder pain was going away. Changed suction back to intermittent. Paged MD. MD stated that it was ok to stay on medium suction as long as it was still intermittently. Night shift nurse and charge nurse made aware. Will increase frequency of flushing to ensure NGT patency.  Jerene Pitch

## 2020-11-15 NOTE — Progress Notes (Signed)
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  Will follow with you.        Armandina Gemma, MD       [facility]       Office: [phone]   Chief Complaint: SBO, hx of pancreatic cancer  Subjective: Patient in bed, family at bedside.  NG in place with good symptomatic relief.  Objective: Vital signs in last 24 hours: Temp:  [98.1 F (36.7 C)-99.3 F (37.4 C)] 98.6 F (37 C) (09/05 0200) Pulse Rate:  [7-83] 69 (09/05 0200) Resp:  [15-18] 16 (09/05 0200) BP: (103-118)/(66-84) 104/73 (09/05 0200) SpO2:  [99 %-100 %] 99 % (09/05 0200) Weight:  [57.7 kg] 57.7 kg (09/04 1839)    Intake/Output from previous day: 09/04 0701 - 09/05 0700 In: 1000 [IV Piggyback:1000] Out: 2300 [Urine:1300; Emesis/NG output:1000] Intake/Output this shift: No intake/output data recorded.  Physical Exam: HEENT - sclerae clear, mucous membranes moist Neck - soft Abdomen - soft without distension; brownish/bilious in NG output; non-tender Ext - no edema, non-tender Neuro - alert & oriented, no focal deficits  Lab Results:  Recent Labs    11/14/20 1108 11/15/20 0325  WBC 2.1* 2.5*  HGB 8.8* 8.0*  HCT 28.4* 25.2*  PLT 198 191   BMET Recent Labs    11/14/20 1152 11/15/20 0325  NA 140 142  K 4.1 3.6  CL 112* 109  CO2 19* 24  GLUCOSE 123* 95  BUN 16 18  CREATININE 0.92 1.01  CALCIUM 9.7 9.4   PT/INR No results for input(s): LABPROT, INR in the last 72 hours. Comprehensive Metabolic Panel:    Component Value Date/Time   NA 142 11/15/2020 0325   NA 140 11/14/2020 1152   K 3.6 11/15/2020 0325   K 4.1 11/14/2020 1152   CL 109  11/15/2020 0325   CL 112 (H) 11/14/2020 1152   CO2 24 11/15/2020 0325   CO2 19 (L) 11/14/2020 1152   BUN 18 11/15/2020 0325   BUN 16 11/14/2020 1152   CREATININE 1.01 11/15/2020 0325   CREATININE 0.92 11/14/2020 1152   GLUCOSE 95 11/15/2020 0325   GLUCOSE 123 (H) 11/14/2020 1152   CALCIUM 9.4 11/15/2020 0325   CALCIUM 9.7 11/14/2020 1152   AST 78 (H) 11/14/2020 1152   AST 20 08/25/2020 2228   ALT 79 (H) 11/14/2020 1152   ALT 25 08/25/2020 2228   ALKPHOS 255 (H) 11/14/2020 1152   ALKPHOS 139 (H) 08/25/2020 2228   BILITOT 3.0 (H) 11/14/2020 1152   BILITOT 0.7 08/25/2020 2228   PROT 8.4 (H) 11/14/2020 1152   PROT 7.0 08/25/2020 2228   ALBUMIN 3.7 11/14/2020 1152   ALBUMIN 3.5 08/25/2020 2228    Studies/Results: CT Abdomen Pelvis W Contrast  Result Date: 11/14/2020 CLINICAL DATA:  Nausea/vomiting EXAM: CT ABDOMEN AND PELVIS WITH CONTRAST TECHNIQUE: Multidetector CT imaging of the abdomen and pelvis was performed using the standard protocol following bolus administration of intravenous contrast. CONTRAST:  40m OMNIPAQUE IOHEXOL 350 MG/ML SOLN COMPARISON:  None. FINDINGS: Lower chest: No acute abnormality. Hepatobiliary: There are multiple new hypodense liver lesions, many of which with peripheral enhancement, largest measuring 6.6 cm  in the central liver (series 2, image 13). Prior cholecystectomy. Pancreas: Prior distal pancreatectomy and splenectomy. Residual pancreatic head tissue noted and unremarkable. Spleen: Splenectomy. Adrenals/Urinary Tract: Adrenal glands are unremarkable. Kidneys are normal, without renal calculi, focal lesion, or hydronephrosis. Bladder is unremarkable. Stomach/Bowel: Dilated stomach and duodenum. There is an abrupt transition point at the duodenal jejunal junction (sagittal image 102). There is wall thickening of the colon at the hepatic flexure with mild adjacent stranding. The appendix is normal. Vascular/Lymphatic: Aortoiliac atherosclerotic calcifications.  No AAA. There is severe narrowing of the portal vein and superior mesenteric vein with some collateral vessels noted. The celiac trunk is either completely obstructed external compression or there is a common celiac trunk and superior mesenteric artery. There is a hepatic artery arising from the SMA, likely right hepatic artery, which appears severely narrowed coursing through this soft tissue. Reproductive: Unremarkable. Other: Prominent soft tissue density in the lesser sac, surrounding the superior mesenteric artery takeoff, and encasing the proximal portal vein and portal-SMV confluence, severely narrowing these vessels. This soft tissue abuts the lesser curvature of the stomach in the left adrenal gland. Musculoskeletal: No acute osseous abnormality. No suspicious lytic or blastic lesions. There is a nodular density in the subcutaneous tissues of the right hemiabdomen measuring 7 mm (series 2, image 42). IMPRESSION: High-grade bowel obstruction with marked dilation of the stomach and duodenum, abrupt transition point at the duodenal-jejunal junction. This is either due to adhesions or metastatic disease. There is prominent soft tissue density in the lesser sac, encasing the superior mesenteric artery takeoff, portal vein and portal-SMV confluence, and right hepatic artery, consistent with metastatic disease. The celiac trunk is not visualized, either obstructed by external compression or there is a common trunk of the celiac and SMA. The SMA is patent. Severe narrowing of the proximal portal vein and portal-SMV confluence. Severe narrowing of the right hepatic artery which appears to arise from the SMA. Multiple hypodense liver lesions, new from prior exam in August 2018, consistent with metastatic disease. Largest lesion measures up to 6.6 cm in the central liver, which demonstrates lower density than several other lesions and may represent a necrotic metastasis, possibly treatment related. Correlation with  any more recent prior imaging, if available, would be useful. Nodular soft tissue density in the subcutaneous tissues along the right hemiabdomen measuring 7 mm, again correlation with prior exams would be useful. Focal colonic wall thickening with adjacent stranding at the hepatic flexure, consistent with mild focal colitis. These results were called by telephone at the time of interpretation on 11/14/2020 at 1:30 pm to provider HALEY SAGE , who verbally acknowledged these results. Electronically Signed   By: Maurine Simmering M.D.   On: 11/14/2020 13:46   DG Abd Portable 1V  Result Date: 11/15/2020 CLINICAL DATA:  NG tube position EXAM: PORTABLE ABDOMEN - 1 VIEW COMPARISON:  11/14/2020 FINDINGS: NG tube coils in the fundus of the stomach, unchanged since prior study. IMPRESSION: NG tube continues to coil in the fundus of the stomach. Electronically Signed   By: Rolm Baptise M.D.   On: 11/15/2020 00:59   DG Abd Portable 1V  Result Date: 11/14/2020 CLINICAL DATA:  NG tube EXAM: PORTABLE ABDOMEN - 1 VIEW COMPARISON:  11/14/2020 FINDINGS: NG tube coils in the fundus of the stomach. IMPRESSION: NG tube in the fundus of the stomach. Electronically Signed   By: Rolm Baptise M.D.   On: 11/14/2020 23:45   DG Abd Portable 1V-Small Bowel Protocol-Position Verification  Result Date:  11/14/2020 CLINICAL DATA:  NG tube placement. EXAM: PORTABLE ABDOMEN - 1 VIEW COMPARISON:  CT earlier today. FINDINGS: Tip and side port of the enteric tube below the diaphragm in the stomach. No gaseous distention of bowel loops in the upper abdomen. Excreted IV contrast in the renal collecting systems. IMPRESSION: Tip and side port of the enteric tube below the diaphragm in the stomach. Electronically Signed   By: Keith Rake M.D.   On: 11/14/2020 18:47      Armandina Gemma 11/15/2020   Patient ID: Frank Garrett, male   DOB: 1960/07/27, 60 y.o.   MRN: [id number]

## 2020-11-15 NOTE — Consult Note (Signed)
Palliative Care Consult Note                                  Date: 11/15/2020   Patient Name: Frank Garrett  DOB: 04/30/1960  MRN: [id number]  Age / Sex: 61 y.o., male  PCP: Gregor Hams, FNP Referring Physician: Kathie Dike, MD  Reason for Consultation: Establishing goals of care  HPI/Patient Profile: 60 y.o. 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  General surgery was consulted and the patient was admitted for work-up and treatment.  SBO protocol initiated including NG tube placement.  Surgery saw the patient and felt that he certainly is having progressive metastatic disease leading to malignant bowel obstruction, no need for emergency surgery.  Question possibility for palliative G-tube.  Also incidentally COVID-19 positive.  Palliative medicine was consulted for goals of care discussion.  Past Medical History:  Diagnosis Date  . Anemia   . Cancer ([place]) 04/2016   Pancreatic Adenocarcinoma  . GERD (gastroesophageal reflux disease)   . Hypertension   . Pancreas cancer [facility])    Pancreatic cancer s/p radical distal pancreaticosplenectomy with resection of celiac axis, SMV, portal vein repair, lateral venorrhaphy, abdominal lymph node dissection, [facility].    Subjective:   This NP Walden Field reviewed medical records, received report from team, assessed the patient and then meet at the patient's bedside to discuss diagnosis, prognosis, GOC, EOL wishes disposition and options.  I met with the patient and his wife Laverne at the bedside.   Concept of Palliative Care was introduced as specialized medical care for people and their families living with serious illness.  If focuses on  providing relief from the symptoms and stress of a serious illness.  The goal is to improve quality of life for both the patient and the family. Values and goals of care important to patient and family were attempted to be elicited.  Created space and opportunity for patient  and family to explore thoughts and feelings regarding current medical situation   Natural trajectory and current clinical status were discussed. Questions and concerns addressed. Patient  encouraged to call with questions or concerns.    Patient/Family Understanding of Illness: They understand he has pancreatic cancer with metastasis.  They know he was started on palliative chemotherapy by cancer treatment centers in [place].  They understand that this is not to cure his cancer but to slow/stop the progression in an attempt to give him more time.  He knows that he does have a bowel obstruction that could be due to the cancer which is why he was having a lot of nausea and vomiting.  Life Review: The patient does work full-time as a Clinical cytogeneticist.  He states that they go wherever they need to, not just in [place].  He said ironically his last big job was in [employer] of the cancer center down in [place], [place].  He thought he was going to be going back to work this week, but then he became sick and had to come to the hospital.  He was born and raised in [place], [place].  He currently lives with his wife in [place], [place].  He has 4  chickens and grows his own vegetables.  He tries to eat natural foods rather than processed foods from the grocery store.  He states this is the way he was raised.  His chickens and his garden are pretty much his hobbies.  He does also "do music" as a DJ.  Patient Values: Independence, self-sufficiency, family.  Today's Discussion: Overall he is feeling better today.  A couple brief twinges of abdominal discomfort but otherwise no abdominal pain.  Nausea and  vomiting significantly improved with his NG tube.  We had a good discussion on his life review and values.  I attempted to bring up code discussion and the patient seems to get quite defensive stating "up until now I was feeling good but now he had me feeling like you are signing my death certificate.  I was told that they may take the tube out today.  I was planning on going home and living my life.  Now you are talking like I am not going to make it today."  I informed him that my intent was not to upset him and reiterated our goal to Hope/pray for the best but plan for if things do not go the way we would like them to.  At this point I felt there is no need to continue goals of care discussion today.    His wife states that they have previously signed healthcare power of attorney paperwork with her lawyer and she would bring a copy in so that we could scanned into the chart.  Review of Systems  Gastrointestinal:  Positive for abdominal pain. Negative for abdominal distention, nausea and vomiting.   Objective:   Primary Diagnoses: Present on Admission: . Pancreas cancer ([place]) . Nausea and vomiting   Physical Exam Vitals and nursing note reviewed.  Constitutional:      General: He is not in acute distress.    Appearance: He is cachectic. He is ill-appearing. He is not toxic-appearing.  HENT:     Head: Normocephalic and atraumatic.     Nose:     Comments: NGT in place to wall suction Cardiovascular:     Rate and Rhythm: Normal rate and regular rhythm.     Heart sounds: No murmur heard. Pulmonary:     Effort: Pulmonary effort is normal. No respiratory distress.     Breath sounds: Normal breath sounds. No wheezing or rhonchi.  Abdominal:     General: Abdomen is flat.     Palpations: Abdomen is soft.  Skin:    General: Skin is warm and dry.  Neurological:     General: No focal deficit present.     Mental Status: He is alert.  Psychiatric:     Comments: Pleasantly conversational,  became upset with discussion of code status    Vital Signs:  BP 104/73 (BP Location: Right Arm)   Pulse 69   Temp 98.6 F (37 C) (Oral)   Resp 16   Ht 5' 8"  (1.727 m)   Wt 57.7 kg   SpO2 99%   BMI 19.34 kg/m   Palliative Assessment/Data: 80%    Advanced Care Planning:   Primary Decision Maker: PATIENT  Code Status/Advance Care Planning: Full code  A discussion was had today regarding advanced directives. Concepts specific to code status was had.  The patient became upset at this and no further discussion was had at this point.  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  He was recently started on palliative chemotherapy at the cancer treatment center in [place], [place].  He states the goal was to stop spread of the disease rather than cure.  However, it seems apparent that his disease is progressing regardless.  Further time would likely be more revealing.  He does have an SBO and years talked with surgery for venting G-tube, although no decision made yet.  SUMMARY OF RECOMMENDATIONS   Continue to treat the treatable When his wife brings [facility] POA paperwork and we will scanned this into the system Hold off on further goals of care discussion for now May need further [place] discussion if any significant clinical change PMT will continue to follow  Symptom Management:  Per primary team Palliative medicine is available to assist as needed  Prognosis:  Unable to determine  Discharge Planning:  To Be Determined   Discussed with: Patient, family, Dr. Roderic Palau, Berton Mount (RN)    Thank you for allowing [place] to participate in the care of Curtiss Mahmood PMT will continue to support holistically.  Time  Total: 75 min  Greater than 50%  of this time was spent counseling and coordinating care related to the above assessment and plan.  Signed by: Walden Field, NP Palliative Medicine Team  Team Phone # [phone] (Nights/Weekends)  11/15/2020, 9:48 AM

## 2020-11-16 ENCOUNTER — Inpatient Hospital Stay (HOSPITAL_COMMUNITY): Payer: Commercial Managed Care - PPO

## 2020-11-16 DIAGNOSIS — R079 Chest pain, unspecified: Secondary | ICD-10-CM

## 2020-11-16 DIAGNOSIS — F419 Anxiety disorder, unspecified: Secondary | ICD-10-CM

## 2020-11-16 LAB — COMPREHENSIVE METABOLIC PANEL
ALT: 87 U/L — ABNORMAL HIGH (ref 0–44)
AST: 49 U/L — ABNORMAL HIGH (ref 15–41)
Albumin: 3.7 g/dL (ref 3.5–5.0)
Alkaline Phosphatase: 288 U/L — ABNORMAL HIGH (ref 38–126)
Anion gap: 11 (ref 5–15)
BUN: 28 mg/dL — ABNORMAL HIGH (ref 6–20)
CO2: 25 mmol/L (ref 22–32)
Calcium: 10.1 mg/dL (ref 8.9–10.3)
Chloride: 112 mmol/L — ABNORMAL HIGH (ref 98–111)
Creatinine, Ser: 1.25 mg/dL — ABNORMAL HIGH (ref 0.61–1.24)
GFR, Estimated: >60 mL/min (ref >60)
Glucose, Bld: 105 mg/dL — ABNORMAL HIGH (ref 70–99)
Potassium: 3.6 mmol/L (ref 3.5–5.1)
Sodium: 148 mmol/L — ABNORMAL HIGH (ref 135–145)
Total Bilirubin: 1.5 mg/dL — ABNORMAL HIGH (ref 0.3–1.2)
Total Protein: 8.6 g/dL — ABNORMAL HIGH (ref 6.5–8.1)

## 2020-11-16 LAB — CBC WITH DIFFERENTIAL/PLATELET
Abs Immature Granulocytes: 0.01 10*3/uL (ref 0.00–0.07)
Basophils Absolute: 0 10*3/uL (ref 0.0–0.1)
Basophils Relative: 0 %
Eosinophils Absolute: 0 10*3/uL (ref 0.0–0.5)
Eosinophils Relative: 2 %
HCT: 29.8 % — ABNORMAL LOW (ref 39.0–52.0)
Hemoglobin: 9.5 g/dL — ABNORMAL LOW (ref 13.0–17.0)
Immature Granulocytes: 0 %
Lymphocytes Relative: 46 %
Lymphs Abs: 1.1 10*3/uL (ref 0.7–4.0)
MCH: 29.4 pg (ref 26.0–34.0)
MCHC: 31.9 g/dL (ref 30.0–36.0)
MCV: 92.3 fL (ref 80.0–100.0)
Monocytes Absolute: 0.8 10*3/uL (ref 0.1–1.0)
Monocytes Relative: 30 %
Neutro Abs: 0.6 10*3/uL — ABNORMAL LOW (ref 1.7–7.7)
Neutrophils Relative %: 22 %
Platelets: 240 10*3/uL (ref 150–400)
RBC: 3.23 MIL/uL — ABNORMAL LOW (ref 4.22–5.81)
RDW: 18 % — ABNORMAL HIGH (ref 11.5–15.5)
WBC: 2.5 10*3/uL — ABNORMAL LOW (ref 4.0–10.5)
nRBC: 6 % — ABNORMAL HIGH (ref 0.0–0.2)

## 2020-11-16 LAB — C-REACTIVE PROTEIN: CRP: 2 mg/dL — ABNORMAL HIGH (ref <1.0)

## 2020-11-16 LAB — D-DIMER, QUANTITATIVE: D-Dimer, Quant: 3.2 ug/mL-FEU — ABNORMAL HIGH (ref 0.00–0.50)

## 2020-11-16 LAB — FERRITIN: Ferritin: 744 ng/mL — ABNORMAL HIGH (ref 24–336)

## 2020-11-16 MED ORDER — KCL IN DEXTROSE-NACL 20-5-0.45 MEQ/L-%-% IV SOLN
INTRAVENOUS | Status: DC
  Filled 2020-11-16: qty 1000

## 2020-11-16 MED ORDER — CHLORHEXIDINE GLUCONATE CLOTH 2 % EX PADS
6.0000 | MEDICATED_PAD | Freq: Every day | CUTANEOUS | Status: DC
  Administered 2020-11-17 – 2020-11-29 (×11): 6 via TOPICAL

## 2020-11-16 MED ORDER — KCL IN DEXTROSE-NACL 40-5-0.45 MEQ/L-%-% IV SOLN
INTRAVENOUS | Status: AC
  Filled 2020-11-16 (×11): qty 1000

## 2020-11-16 MED ORDER — LORAZEPAM 2 MG/ML IJ SOLN
0.5000 mg | INTRAMUSCULAR | Status: DC | PRN
  Administered 2020-11-16 – 2020-11-28 (×12): 0.5 mg via INTRAVENOUS
  Filled 2020-11-16 (×14): qty 1

## 2020-11-16 NOTE — Plan of Care (Signed)
  Problem: Health Behavior/Discharge Planning: Goal: Ability to manage health-related needs will improve Outcome: Progressing   Problem: Clinical Measurements: Goal: Diagnostic test results will improve Outcome: Progressing   Problem: Activity: Goal: Risk for activity intolerance will decrease Outcome: Progressing   Problem: Nutrition: Goal: Adequate nutrition will be maintained Outcome: Progressing   Problem: Coping: Goal: Level of anxiety will decrease Outcome: Progressing   Problem: Elimination: Goal: Will not experience complications related to bowel motility Outcome: Progressing   Problem: Safety: Goal: Ability to remain free from injury will improve Outcome: Progressing

## 2020-11-16 NOTE — Progress Notes (Signed)
CC: Nausea vomiting, history of pancreatic cancer with metastasis.  Subjective: Patient actually feels better this AM.  He is not sure how much p.o. intake he had yesterday he says he has been sticking to the ice chips as instructed.  His NG drainage is still pretty green and dark.  He has less distention and he feels better.  We discussed SBO from adhesions, versus metastasis.  I told him at this point I think it might be more from adhesions.  He did see Paliative Care yesterday and their recommendations are below.  Objective: Vital signs in last 24 hours: Temp:  [97.5 F (36.4 C)-98.5 F (36.9 C)] 97.5 F (36.4 C) (09/06 0809) Pulse Rate:  [77-84] 80 (09/06 0809) Resp:  [12-20] 12 (09/06 0809) BP: (119-131)/(82-88) 121/86 (09/06 0809) SpO2:  [100 %] 100 % (09/06 0809) Last BM Date: 11/15/20 1834 p.o. intake recorded 100 IV Urine 300 NG 2800 Afebrile, vital signs are stable Potassium 3.6, sodium 148, , alk phos 288, AST 49, ALT 87, total bilirubin 1.5 prealbumin 16 Creatinine 0.92>> 1.01>> 1.25(9/6) WBC 2.5 H/H 8.8/28.4>> 8.0/25.2>> 9.5/29.8 AXR are this AM: Normal gas pattern contrast is now seen in the distal colon and rectal vault. CXR this a.m.: Pending Intake/Output from previous day: 09/05 0701 - 09/06 0700 In: 1934 [P.O.:1834; IV Piggyback:100] Out: 3100 [Urine:300; Emesis/NG output:2800] Intake/Output this shift: Total I/O In: -  Out: 900 [Emesis/NG output:900]  General appearance: alert, cooperative, and no distress Resp: clear to auscultation bilaterally GI: He has chronic diarrhea and has had some today.  His abdomen is not distended he has positive bowel sounds, contrast is in the colon and the rectum.  Lab Results:  Recent Labs    11/15/20 0325 11/16/20 0318  WBC 2.5* 2.5*  HGB 8.0* 9.5*  HCT 25.2* 29.8*  PLT 191 240    BMET Recent Labs    11/15/20 0325 11/16/20 0318  NA 142 148*  K 3.6 3.6  CL 109 112*  CO2 24 25  GLUCOSE 95 105*   BUN 18 28*  CREATININE 1.01 1.25*  CALCIUM 9.4 10.1   PT/INR No results for input(s): LABPROT, INR in the last 72 hours.  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  He has chronic diarrhea and had some today.  I will get a try him on some clamping trials and continue ice chips and sips.  I told him he could have a Icee from the floor.  Increase IV fluids and recheck labs in the morning.  FEN: N.p.o. except for ice chips ID: None DVT: Lovenox  Pancreatic adenocarcinoma with lung and liver metastasis; s/p radical distal pancreaticosplenectomy with resection of celiac axis, SMV, portal vein repair, lateral venorrhaphy, abdominal lymph node dissection, cholecystectomy on 10/19/2016 at [facility]  -No oncology follow-up x4 years COVID-positive -asymptomatic  - Complaints of chest pain this AM Hypertension Chronic diarrhea GERD  Paliative Care recommendations   Continue to treat the treatable When his wife brings [facility] POA paperwork and we will scanned this into the system Hold off on further goals of care discussion for now May need further [place] discussion if any significant clinical change PMT will  continue to follow           LOS: 2 days    Jaxten Brosh 11/16/2020 Please see Amion

## 2020-11-16 NOTE — Plan of Care (Signed)

## 2020-11-16 NOTE — Progress Notes (Signed)
Mrs Frank Garrett, wife updated about Patient condition

## 2020-11-16 NOTE — Progress Notes (Addendum)
Daily Progress Note   Patient Name: Frank Garrett       Date: 11/16/2020 DOB: 08-May-1960  Age: 60 y.o. MRN#: [id number] Attending Physician: Kathie Dike, MD Primary Care Physician: Gregor Hams, FNP Admit Date: 11/14/2020 Length of Stay: 2 days  Reason for Consultation/Follow-up: Establishing goals of care  HPI/Patient Profile:  60 y.o. 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  General surgery was consulted and the patient was admitted for work-up and treatment.  SBO protocol initiated including NG tube placement.  Surgery saw the patient and felt that he certainly is having progressive metastatic disease leading to malignant bowel obstruction, no need for emergency surgery.  Question possibility for palliative G-tube.   Also incidentally COVID-19 positive.   Palliative medicine was consulted for goals of care discussion.  Subjective:   Subjective: Chart Reviewed. Updates received. Patient Assessed. Created space and opportunity for patient  and family to explore thoughts and feelings regarding current medical situation.  Today's Discussion: Met with the patient and a friend at the bedside. Discussed overnight issues with the NGT. At first wasn't working in the afternoon/evening resulting in N/V, improved with increase to medium suction. Now Today has been having some chest discomfort (felt possibly due to medium level of suction, also possibly some anxiety). He does admit some anxiety and I offered medication to help, which he said he would appreciate. Otherwise no abdominal pain.    Review of Systems  Respiratory:  Negative for shortness of breath.   Cardiovascular:  Positive for chest pain (mild discomfort).  Gastrointestinal:  Positive for nausea (yesterday evening) and vomiting (yesterday  evening). Negative for abdominal pain.  Psychiatric/Behavioral:         Anxiety, worry   Objective:   Physical Exam: Physical Exam Constitutional:      General: He is not in acute distress.    Appearance: He is cachectic. He is ill-appearing.  HENT:     Head: Normocephalic and atraumatic.  Cardiovascular:     Rate and Rhythm: Normal rate and regular rhythm.  Pulmonary:     Effort: Pulmonary effort is normal. No respiratory distress.  Abdominal:     General: Abdomen is flat.     Palpations: Abdomen is soft.  Skin:    General: Skin is warm and dry.  Neurological:     General: No focal deficit present.     Mental Status: He is alert.  Psychiatric:        Mood and Affect: Mood normal.        Behavior: Behavior normal.    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  He was recently started on palliative chemotherapy at the cancer treatment center in [place], [place].  He states the goal was to stop spread of the disease rather than cure.  However, it seems apparent that his disease is progressing regardless.  Further time would likely be more revealing.  He does have an SBO and years talked with surgery for venting G-tube, although no decision made yet. He declined to discuss GOC/Code Status yesterday; wife states she will  bring in [facility] paperwork Since yesterday has had some difficulty with the NGT suction resulting in N/V and requiring increasing suction to medium. With increased suction level and anxiety, having some chest discomfort  SUMMARY OF RECOMMENDATIONS   Continue to treat the treatable When his wife brings [facility] POA paperwork and we will scanned this into the system Hold off on further goals of care discussion for  now May need further [place] discussion if any significant clinical change Add Ativan 0.79m q4hr prn for anxiety PMT will continue to follow  Code Status: Full code  Prognosis: Unable to determine  Discharge Planning: To Be Determined  Discussed with: Patient, Dr. MRoderic Palau [place] (RN)  Thank you for allowing [place] participate in the care of CRivka BarbaraPMT will continue to support holistically.  Time Total: 75 min  Visit consisted of counseling and education dealing with the complex and emotionally intense issues of symptom management and palliative care in the setting of serious and potentially life-threatening illness. Greater than 50%  of this time was spent counseling and coordinating care related to the above assessment and plan.  EWalden Field NP Palliative Medicine Team  Team Phone # [phone](Nights/Weekends)  11/09/2020, 8:17 AM

## 2020-11-16 NOTE — Progress Notes (Signed)
PROGRESS NOTE    Frank Garrett  [id number] DOB: 28-Jul-1960 DOA: 11/14/2020 PCP: Gregor Hams, FNP    Brief Narrative:  60 year old male with a history of metastatic pancreatic cancer, admitted to the hospital with intractable nausea and vomiting.  He was found to have small bowel obstruction and was admitted for further management.  He had NG tube placed for decompression.  General surgery consulted.   Assessment & Plan:   Principal Problem:   SBO (small bowel obstruction) (HCC) Active Problems:   Nausea and vomiting   Anemia of chronic disease   Asplenia   Pancreas cancer (HCC)   Chronic anticoagulation   Liver metastasis from pancreas cancer   Neutropenia (HCC)   On antineoplastic chemotherapy   Small bowel obstruction -Currently has NG tube for decompression -SBO protocol -General surgery following -He had approximately 3L of NG output yesterday, seems to continue to have a significant amount at this time -he does describe having a bowel movement this morning   Metastatic pancreatic cancer -Patient receives his care with [facility] [place] in [place] -Palliative care consulted, but patient did not wish to discuss goals of care  Hypernatremia -likely related to high output from NG tube -start on hypotonic fluids  AKI -creatinine 0.9 on admission and has trended up to 1.25 -likely related to hypovolemia from increased NG output -start on IVF and follow  Right sided chest pressure -unclear etiology -not reproducible -he is not short of breath or hypoxic -will check chest xray  COVID-positive -No respiratory symptoms or fevers -Following inflammatory markers -Treat supportively at this time  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 -Continue therapeutic Lovenox  Right shoulder pain -Suspect this may be related to muscle strain, trapezius -Improved with robaxin  Goals of care -appreciate palliative  care input -currently full code   DVT prophylaxis:   Therapeutic Lovenox  Code Status: Full code Family Communication: Discussed with wife at the bedside Disposition Plan: Status is: Inpatient  Remains inpatient appropriate because:Ongoing diagnostic testing needed not appropriate for outpatient work up, IV treatments appropriate due to intensity of illness or inability to take PO, and Inpatient level of care appropriate due to severity of illness  Dispo: The patient is from: Home              Anticipated d/c is to: Home              Patient currently is not medically stable to d/c.   Difficult to place patient No         Consultants:  General surgery Palliative care  Procedures:    Antimicrobials:      Subjective: Feels that right shoulder pain better after robaxin. He does complain of pressure in his right chest. Slightly better when leaning forward. No shortness of breath. No vomiting. He had a bowel movement this morning.  Objective: Vitals:   11/16/20 0233 11/16/20 0536 11/16/20 0809 11/16/20 1226  BP: 119/87 121/85 121/86 124/83  Pulse: 81 83 80 100  Resp: '20  12 18  '$ Temp: 97.6 F (36.4 C) (!) 97.5 F (36.4 C) (!) 97.5 F (36.4 C) (!) 97.3 F (36.3 C)  TempSrc: Oral Oral Axillary Oral  SpO2: 100% 100% 100% 100%  Weight:      Height:        Intake/Output Summary (Last 24 hours) at 11/16/2020 1649 Last data filed at 11/16/2020 1508 Gross per 24 hour  Intake 2101.32 ml  Output 4000  ml  Net -1898.68 ml   Filed Weights   11/14/20 1839  Weight: 57.7 kg    Examination:  General exam: Alert, awake, oriented x 3 Respiratory system: Clear to auscultation. Respiratory effort normal. Cardiovascular system:RRR. No murmurs, rubs, gallops. Gastrointestinal system: Abdomen is nondistended, soft and nontender. No organomegaly or masses felt. Normal bowel sounds heard. NG tube in place Central nervous system: Alert and oriented. No focal neurological  deficits. Extremities: No C/C/E, +pedal pulses Skin: No rashes, lesions or ulcers Psychiatry: Judgement and insight appear normal. Mood & affect appropriate.      Data Reviewed: I have personally reviewed following labs and imaging studies  CBC: Recent Labs  Lab 11/14/20 1108 11/15/20 0325 11/16/20 0318  WBC 2.1* 2.5* 2.5*  NEUTROABS 0.8* 0.7* 0.6*  HGB 8.8* 8.0* 9.5*  HCT 28.4* 25.2* 29.8*  MCV 93.7 92.6 92.3  PLT 198 191 [id number]   Basic Metabolic Panel: Recent Labs  Lab 11/14/20 1152 11/15/20 0325 11/16/20 0318  NA 140 142 148*  K 4.1 3.6 3.6  CL 112* 109 112*  CO2 19* 24 25  GLUCOSE 123* 95 105*  BUN 16 18 28*  CREATININE 0.92 1.01 1.25*  CALCIUM 9.7 9.4 10.1   GFR: Estimated Creatinine Clearance: 52.6 mL/min (A) (by C-G formula based on SCr of 1.25 mg/dL (H)). Liver Function Tests: Recent Labs  Lab 11/14/20 1152 11/16/20 0318  AST 78* 49*  ALT 79* 87*  ALKPHOS 255* 288*  BILITOT 3.0* 1.5*  PROT 8.4* 8.6*  ALBUMIN 3.7 3.7   Recent Labs  Lab 11/14/20 1152  LIPASE 207*   No results for input(s): AMMONIA in the last 168 hours. Coagulation Profile: No results for input(s): INR, PROTIME in the last 168 hours. Cardiac Enzymes: No results for input(s): CKTOTAL, CKMB, CKMBINDEX, TROPONINI in the last 168 hours. BNP (last 3 results) No results for input(s): PROBNP in the last 8760 hours. HbA1C: No results for input(s): HGBA1C in the last 72 hours. CBG: No results for input(s): GLUCAP in the last 168 hours. Lipid Profile: No results for input(s): CHOL, HDL, LDLCALC, TRIG, CHOLHDL, LDLDIRECT in the last 72 hours. Thyroid Function Tests: No results for input(s): TSH, T4TOTAL, FREET4, T3FREE, THYROIDAB in the last 72 hours. Anemia Panel: Recent Labs    11/15/20 0325 11/16/20 0318  FERRITIN 668* 744*   Sepsis Labs: Recent Labs  Lab 11/14/20 1152  LATICACIDVEN 2.3*    Recent Results (from the past 240 hour(s))  Resp Panel by RT-PCR (Flu A&B,  Covid) Nasopharyngeal Swab     Status: Abnormal   Collection Time: 11/14/20 12:58 PM   Specimen: Nasopharyngeal Swab; Nasopharyngeal(NP) swabs in vial transport medium  Result Value Ref Range Status   SARS Coronavirus 2 by RT PCR POSITIVE (A) NEGATIVE Final    Comment: RESULT CALLED TO, READ BACK BY AND VERIFIED WITH: Lenn Cal, RN [phone] [phone] (NOTE) SARS-CoV-2 target nucleic acids are DETECTED.  The SARS-CoV-2 RNA is generally detectable in upper respiratory specimens during the acute phase of infection. Positive results are indicative of the presence of the identified virus, but do not rule out bacterial infection or co-infection with other pathogens not detected by the test. Clinical correlation with patient history and other diagnostic information is necessary to determine patient infection status. The expected result is Negative.  Fact Sheet for Patients: EntrepreneurPulse.com.au  Fact Sheet for Healthcare Providers: IncredibleEmployment.be  This test is not yet approved or cleared by the [place] FDA and  has been authorized for detection  and/or diagnosis of SARS-CoV-2 by FDA under an Emergency Use Authorization (EUA).  This EUA will remain in effect (meaning this test can be used)  for the duration of  the COVID-19 declaration under Section 564(b)(1) of the Act, 21 U.S.C. section 360bbb-3(b)(1), unless the authorization is terminated or revoked sooner.     Influenza A by PCR NEGATIVE NEGATIVE Final   Influenza B by PCR NEGATIVE NEGATIVE Final    Comment: (NOTE) The Xpert Xpress SARS-CoV-2/FLU/RSV plus assay is intended as an aid in the diagnosis of influenza from Nasopharyngeal swab specimens and should not be used as a sole basis for treatment. Nasal washings and aspirates are unacceptable for Xpert Xpress SARS-CoV-2/FLU/RSV testing.  Fact Sheet for Patients: EntrepreneurPulse.com.au  Fact Sheet for  Healthcare Providers: IncredibleEmployment.be  This test is not yet approved or cleared by the [place] FDA and has been authorized for detection and/or diagnosis of SARS-CoV-2 by FDA under an Emergency Use Authorization (EUA). This EUA will remain in effect (meaning this test can be used) for the duration of the COVID-19 declaration under Section 564(b)(1) of the Act, 21 U.S.C. section 360bbb-3(b)(1), unless the authorization is terminated or revoked.  Performed at [facility], [place] [address] [place], [place] [address]          Radiology Studies: DG CHEST PORT 1 VIEW  Result Date: 11/16/2020 CLINICAL DATA:  Emesis EXAM: PORTABLE CHEST 1 VIEW COMPARISON:  Chest x-ray dated Jul 11, 2016 FINDINGS: Left chest wall port with the tip near the superior cavoatrial junction. NG tube partially seen coursing below the diaphragm. The heart size and mediastinal contours are within normal limits. Both lungs are clear. The visualized skeletal structures are unremarkable. IMPRESSION: Lungs are clear. Electronically Signed   By: Yetta Glassman M.D.   On: 11/16/2020 12:50   DG Abd Portable 1V-Small Bowel Obstruction Protocol-24 hr delay  Result Date: 11/16/2020 CLINICAL DATA:  Small-bowel protocol, small-bowel obstruction, 24 hour postcontrast imaging EXAM: PORTABLE ABDOMEN - 1 VIEW COMPARISON:  11/15/2020 FINDINGS: Nasogastric tube remains coiled within the gastric fundus. Previously noted dilated loops of small bowel within the epigastrium containing dilute contrast are no longer visualized. Contrast is seen within the decompressed distal colon and rectal vault. Normal abdominal gas pattern. No gross free intraperitoneal gas. IMPRESSION: Normal abdominal gas pattern. Administered oral contrast is now seen within the distal colon and rectal vault. Electronically Signed   By: Fidela Salisbury M.D.   On: 11/16/2020 01:15   DG Abd Portable 1V-Small Bowel Obstruction  Protocol-initial, 8 hr delay  Result Date: 11/15/2020 CLINICAL DATA:  8 hour delay image for small bowel obstruction. EXAM: PORTABLE ABDOMEN - 1 VIEW COMPARISON:  Radiographs 11/15/2020 and 11/14/2020.  CT 11/14/2020. FINDINGS: 0912 hours. No enteric contrast is visible on the recent prior studies. Nasogastric tube is looped in the proximal stomach which appears decompressed. There is faint contrast material within the duodenum which remains markedly dilated. Some contrast has passed into the colon which is normal in caliber. There is residual faint contrast material in the bladder from previous CT. No extravasated contrast or signs of free air. The bones appear unremarkable. IMPRESSION: Some contrast has passed into the colon which is normal in caliber. However, there is persistent significant dilatation of the duodenum consistent with a high-grade partial bowel obstruction as seen on CT. Electronically Signed   By: Richardean Sale M.D.   On: 11/15/2020 09:31   DG Abd Portable 1V  Result Date: 11/15/2020 CLINICAL DATA:  NG  tube position EXAM: PORTABLE ABDOMEN - 1 VIEW COMPARISON:  11/14/2020 FINDINGS: NG tube coils in the fundus of the stomach, unchanged since prior study. IMPRESSION: NG tube continues to coil in the fundus of the stomach. Electronically Signed   By: Rolm Baptise M.D.   On: 11/15/2020 00:59   DG Abd Portable 1V  Result Date: 11/14/2020 CLINICAL DATA:  NG tube EXAM: PORTABLE ABDOMEN - 1 VIEW COMPARISON:  11/14/2020 FINDINGS: NG tube coils in the fundus of the stomach. IMPRESSION: NG tube in the fundus of the stomach. Electronically Signed   By: Rolm Baptise M.D.   On: 11/14/2020 23:45   DG Abd Portable 1V-Small Bowel Protocol-Position Verification  Result Date: 11/14/2020 CLINICAL DATA:  NG tube placement. EXAM: PORTABLE ABDOMEN - 1 VIEW COMPARISON:  CT earlier today. FINDINGS: Tip and side port of the enteric tube below the diaphragm in the stomach. No gaseous distention of bowel loops in  the upper abdomen. Excreted IV contrast in the renal collecting systems. IMPRESSION: Tip and side port of the enteric tube below the diaphragm in the stomach. Electronically Signed   By: Keith Rake M.D.   On: 11/14/2020 18:47        Scheduled Meds:  bisacodyl  10 mg Rectal Daily   enoxaparin (LOVENOX) injection  60 mg Subcutaneous Q12H   lip balm  1 application Topical BID   Continuous Infusions:  dextrose 5 % and 0.45 % NaCl with KCl 40 mEq/L 100 mL/hr at 11/16/20 1443   methocarbamol (ROBAXIN) IV 1,000 mg (11/15/20 1801)   ondansetron (ZOFRAN) IV       LOS: 2 days    Time spent: 35 minutes    Kathie Dike, MD [facility]   If 7PM-7AM, please contact night-coverage [email or web address]  11/16/2020, 4:49 PM

## 2020-11-16 NOTE — Progress Notes (Signed)
RN notified by Nurse Tech that Pt complaint of chest heaviness. Per Patient, "it is not a chest pain, it just feels like there is weight on my chest". Pt is not feeling nauseous/bloated, VSS, pt not in any form of distress, saturating at 100% room air, NGT  is putting out good amount of drainage. Gershon Cull NP, Hospitalist advised to turn it down to low intermittent. Pt reassessed, chest heaviness has improved. Will continue to monitor.

## 2020-11-17 LAB — COMPREHENSIVE METABOLIC PANEL
ALT: 85 U/L — ABNORMAL HIGH (ref 0–44)
AST: 41 U/L (ref 15–41)
Albumin: 3.7 g/dL (ref 3.5–5.0)
Alkaline Phosphatase: 256 U/L — ABNORMAL HIGH (ref 38–126)
Anion gap: 8 (ref 5–15)
BUN: 36 mg/dL — ABNORMAL HIGH (ref 6–20)
CO2: 29 mmol/L (ref 22–32)
Calcium: 10 mg/dL (ref 8.9–10.3)
Chloride: 108 mmol/L (ref 98–111)
Creatinine, Ser: 1.46 mg/dL — ABNORMAL HIGH (ref 0.61–1.24)
GFR, Estimated: 55 mL/min — ABNORMAL LOW (ref >60)
Glucose, Bld: 137 mg/dL — ABNORMAL HIGH (ref 70–99)
Potassium: 3.8 mmol/L (ref 3.5–5.1)
Sodium: 145 mmol/L (ref 135–145)
Total Bilirubin: 1.1 mg/dL (ref 0.3–1.2)
Total Protein: 8.5 g/dL — ABNORMAL HIGH (ref 6.5–8.1)

## 2020-11-17 LAB — CBC WITH DIFFERENTIAL/PLATELET
Abs Immature Granulocytes: 0.01 10*3/uL (ref 0.00–0.07)
Basophils Absolute: 0 10*3/uL (ref 0.0–0.1)
Basophils Relative: 1 %
Eosinophils Absolute: 0 10*3/uL (ref 0.0–0.5)
Eosinophils Relative: 0 %
HCT: 31.2 % — ABNORMAL LOW (ref 39.0–52.0)
Hemoglobin: 9.7 g/dL — ABNORMAL LOW (ref 13.0–17.0)
Immature Granulocytes: 0 %
Lymphocytes Relative: 41 %
Lymphs Abs: 1.2 10*3/uL (ref 0.7–4.0)
MCH: 29.1 pg (ref 26.0–34.0)
MCHC: 31.1 g/dL (ref 30.0–36.0)
MCV: 93.7 fL (ref 80.0–100.0)
Monocytes Absolute: 1.1 10*3/uL — ABNORMAL HIGH (ref 0.1–1.0)
Monocytes Relative: 36 %
Neutro Abs: 0.7 10*3/uL — ABNORMAL LOW (ref 1.7–7.7)
Neutrophils Relative %: 22 %
Platelets: 269 10*3/uL (ref 150–400)
RBC: 3.33 MIL/uL — ABNORMAL LOW (ref 4.22–5.81)
RDW: 18.5 % — ABNORMAL HIGH (ref 11.5–15.5)
WBC: 2.9 10*3/uL — ABNORMAL LOW (ref 4.0–10.5)
nRBC: 3.8 % — ABNORMAL HIGH (ref 0.0–0.2)

## 2020-11-17 LAB — FERRITIN: Ferritin: 757 ng/mL — ABNORMAL HIGH (ref 24–336)

## 2020-11-17 LAB — D-DIMER, QUANTITATIVE: D-Dimer, Quant: 2.55 ug/mL-FEU — ABNORMAL HIGH (ref 0.00–0.50)

## 2020-11-17 LAB — MAGNESIUM: Magnesium: 2.8 mg/dL — ABNORMAL HIGH (ref 1.7–2.4)

## 2020-11-17 LAB — C-REACTIVE PROTEIN: CRP: 1.2 mg/dL — ABNORMAL HIGH (ref <1.0)

## 2020-11-17 LAB — PATHOLOGIST SMEAR REVIEW

## 2020-11-17 MED ORDER — SODIUM CHLORIDE 0.9% FLUSH
10.0000 mL | Freq: Two times a day (BID) | INTRAVENOUS | Status: DC
Start: 2020-11-17 — End: 2020-11-29
  Administered 2020-11-17 – 2020-11-28 (×9): 10 mL

## 2020-11-17 MED ORDER — SODIUM CHLORIDE 0.9 % IV BOLUS
1000.0000 mL | Freq: Once | INTRAVENOUS | Status: AC
  Administered 2020-11-17: 1000 mL via INTRAVENOUS

## 2020-11-17 NOTE — Progress Notes (Signed)
PROGRESS NOTE    Frank Garrett  [id number] DOB: [birth year] DOA: 11/14/2020 PCP: Gregor Hams, FNP    Brief Narrative:  60 year old male with a history of metastatic pancreatic cancer, admitted to the hospital with intractable nausea and vomiting.  He was found to have small bowel obstruction and was admitted for further management.  He had NG tube placed for decompression.  General surgery consulted.  Assessment & Plan:  Small bowel obstruction -Currently has NG tube for decompression -SBO protocol -General surgery following -NG clamped today, repeat imaging follow-up per surgery, appreciate insight recommendations -Notes multiple episodes of flatus and small bowel movement earlier this morning  Metastatic pancreatic cancer -Patient receives his care with [facility] treatment [facility] [place] in [place] -Palliative care consulted, but patient did not wish to discuss goals of care  Hypernatremia -likely related to high output from NG tube -start on hypotonic fluids  AKI, ongoing -Likely in setting of above, continue IV fluids increase p.o. 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 -Continue therapeutic Lovenox -D-dimer elevated in the setting of COVID, no indication for imaging given treatment continues as above with full dose anticoagulation  Right shoulder pain -In the setting of musculoskeletal strain/sprain, worse with range of motion -Improved with robaxin  Goals of care -appreciate palliative care input -currently full code   DVT prophylaxis:   Therapeutic Lovenox  Code Status: Full code Family Communication: Discussed with wife at the bedside Disposition Plan: Status is:  Inpatient  Remains inpatient appropriate because:Ongoing diagnostic testing needed not appropriate for outpatient work up, IV treatments appropriate due to intensity of illness or inability to take PO, and Inpatient level of care appropriate due to severity of illness  Dispo: The patient is from: Home              Anticipated d/c is to: Home              Patient currently is not medically stable to d/c.   Difficult to place patient No  Consultants:  General surgery Palliative care  Subjective: No acute issues or events overnight, general fatigue and weakness ongoing, diffuse myalgias in the setting of bedbound status but otherwise denies chest pain nausea vomiting diarrhea headache fevers or chills  Objective: Vitals:   11/16/20 0809 11/16/20 1226 11/16/20 2103 11/17/20 0519  BP: 121/86 124/83 122/84 106/83  Pulse: 80 100 85 92  Resp: '12 18 18 20  '$ Temp: (!) 97.5 F (36.4 C) (!) 97.3 F (36.3 C) 97.7 F (36.5 C) 99.6 F (37.6 C)  TempSrc: Axillary Oral Oral Oral  SpO2: 100% 100% 100% 100%  Weight:      Height:        Intake/Output Summary (Last 24 hours) at 11/17/2020 0906 Last data filed at 11/17/2020 0554 Gross per 24 hour  Intake 1979.03 ml  Output 2200 ml  Net -220.97 ml    Filed Weights   11/14/20 1839  Weight: 57.7 kg    Examination:  General exam: Alert, awake, oriented x 3 Respiratory system: Clear to auscultation. Respiratory effort normal. Cardiovascular system:RRR. No murmurs, rubs, gallops. Gastrointestinal system: Abdomen is nondistended, soft and nontender. No organomegaly or masses felt. Normal bowel sounds heard. NG tube in place Central nervous system: Alert and oriented. No focal neurological deficits. Extremities:  No C/C/E, +pedal pulses Skin: No rashes, lesions or ulcers Psychiatry: Judgement and insight appear normal. Mood & affect appropriate.    Data Reviewed: I have personally reviewed following labs and imaging studies  CBC: Recent Labs   Lab 11/14/20 1108 11/15/20 0325 11/16/20 0318 11/17/20 0408  WBC 2.1* 2.5* 2.5* 2.9*  NEUTROABS 0.8* 0.7* 0.6* 0.7*  HGB 8.8* 8.0* 9.5* 9.7*  HCT 28.4* 25.2* 29.8* 31.2*  MCV 93.7 92.6 92.3 93.7  PLT 198 191 240 [id number]    Basic Metabolic Panel: Recent Labs  Lab 11/14/20 1152 11/15/20 0325 11/16/20 0318 11/17/20 0408  NA 140 142 148* 145  K 4.1 3.6 3.6 3.8  CL 112* 109 112* 108  CO2 19* '24 25 29  '$ GLUCOSE 123* 95 105* 137*  BUN 16 18 28* 36*  CREATININE 0.92 1.01 1.25* 1.46*  CALCIUM 9.7 9.4 10.1 10.0  MG  --   --   --  2.8*    GFR: Estimated Creatinine Clearance: 45 mL/min (A) (by C-G formula based on SCr of 1.46 mg/dL (H)). Liver Function Tests: Recent Labs  Lab 11/14/20 1152 11/16/20 0318 11/17/20 0408  AST 78* 49* 41  ALT 79* 87* 85*  ALKPHOS 255* 288* 256*  BILITOT 3.0* 1.5* 1.1  PROT 8.4* 8.6* 8.5*  ALBUMIN 3.7 3.7 3.7    Recent Labs  Lab 11/14/20 1152  LIPASE 207*    No results for input(s): AMMONIA in the last 168 hours. Coagulation Profile: No results for input(s): INR, PROTIME in the last 168 hours. Cardiac Enzymes: No results for input(s): CKTOTAL, CKMB, CKMBINDEX, TROPONINI in the last 168 hours. BNP (last 3 results) No results for input(s): PROBNP in the last 8760 hours. HbA1C: No results for input(s): HGBA1C in the last 72 hours. CBG: No results for input(s): GLUCAP in the last 168 hours. Lipid Profile: No results for input(s): CHOL, HDL, LDLCALC, TRIG, CHOLHDL, LDLDIRECT in the last 72 hours. Thyroid Function Tests: No results for input(s): TSH, T4TOTAL, FREET4, T3FREE, THYROIDAB in the last 72 hours. Anemia Panel: Recent Labs    11/16/20 0318 11/17/20 0408  FERRITIN 744* 757*    Sepsis Labs: Recent Labs  Lab 11/14/20 1152  LATICACIDVEN 2.3*     Recent Results (from the past 240 hour(s))  Resp Panel by RT-PCR (Flu A&B, Covid) Nasopharyngeal Swab     Status: Abnormal   Collection Time: 11/14/20 12:58 PM   Specimen:  Nasopharyngeal Swab; Nasopharyngeal(NP) swabs in vial transport medium  Result Value Ref Range Status   SARS Coronavirus 2 by RT PCR POSITIVE (A) NEGATIVE Final    Comment: RESULT CALLED TO, READ BACK BY AND VERIFIED WITH: Lenn Cal, RN [phone] KDS (NOTE) SARS-CoV-2 target nucleic acids are DETECTED.  The SARS-CoV-2 RNA is generally detectable in upper respiratory specimens during the acute phase of infection. Positive results are indicative of the presence of the identified virus, but do not rule out bacterial infection or co-infection with other pathogens not detected by the test. Clinical correlation with patient history and other diagnostic information is necessary to determine patient infection status. The expected result is Negative.  Fact Sheet for Patients: EntrepreneurPulse.com.au  Fact Sheet for Healthcare Providers: IncredibleEmployment.be  This test is not yet approved or cleared by the [place] FDA and  has been authorized for detection and/or diagnosis of SARS-CoV-2 by FDA under an Emergency Use Authorization (EUA).  This EUA will remain in effect (meaning this test can be used)  for the duration of  the COVID-19  declaration under Section 564(b)(1) of the Act, 21 U.S.C. section 360bbb-3(b)(1), unless the authorization is terminated or revoked sooner.     Influenza A by PCR NEGATIVE NEGATIVE Final   Influenza B by PCR NEGATIVE NEGATIVE Final    Comment: (NOTE) The Xpert Xpress SARS-CoV-2/FLU/RSV plus assay is intended as an aid in the diagnosis of influenza from Nasopharyngeal swab specimens and should not be used as a sole basis for treatment. Nasal washings and aspirates are unacceptable for Xpert Xpress SARS-CoV-2/FLU/RSV testing.  Fact Sheet for Patients: EntrepreneurPulse.com.au  Fact Sheet for Healthcare Providers: IncredibleEmployment.be  This test is not yet approved or  cleared by the [place] FDA and has been authorized for detection and/or diagnosis of SARS-CoV-2 by FDA under an Emergency Use Authorization (EUA). This EUA will remain in effect (meaning this test can be used) for the duration of the COVID-19 declaration under Section 564(b)(1) of the Act, 21 U.S.C. section 360bbb-3(b)(1), unless the authorization is terminated or revoked.  Performed at [facility], [place] [address], [place], [place] [address]      Radiology Studies: DG CHEST PORT 1 VIEW  Result Date: 11/16/2020 CLINICAL DATA:  Emesis EXAM: PORTABLE CHEST 1 VIEW COMPARISON:  Chest x-ray dated Jul 11, 2016 FINDINGS: Left chest wall port with the tip near the superior cavoatrial junction. NG tube partially seen coursing below the diaphragm. The heart size and mediastinal contours are within normal limits. Both lungs are clear. The visualized skeletal structures are unremarkable. IMPRESSION: Lungs are clear. Electronically Signed   By: Yetta Glassman M.D.   On: 11/16/2020 12:50   DG Abd Portable 1V-Small Bowel Obstruction Protocol-24 hr delay  Result Date: 11/16/2020 CLINICAL DATA:  Small-bowel protocol, small-bowel obstruction, 24 hour postcontrast imaging EXAM: PORTABLE ABDOMEN - 1 VIEW COMPARISON:  11/15/2020 FINDINGS: Nasogastric tube remains coiled within the gastric fundus. Previously noted dilated loops of small bowel within the epigastrium containing dilute contrast are no longer visualized. Contrast is seen within the decompressed distal colon and rectal vault. Normal abdominal gas pattern. No gross free intraperitoneal gas. IMPRESSION: Normal abdominal gas pattern. Administered oral contrast is now seen within the distal colon and rectal vault. Electronically Signed   By: Fidela Salisbury M.D.   On: 11/16/2020 01:15   DG Abd Portable 1V-Small Bowel Obstruction Protocol-initial, 8 hr delay  Result Date: 11/15/2020 CLINICAL DATA:  8 hour delay image for small bowel  obstruction. EXAM: PORTABLE ABDOMEN - 1 VIEW COMPARISON:  Radiographs 11/15/2020 and 11/14/2020.  CT 11/14/2020. FINDINGS: 0912 hours. No enteric contrast is visible on the recent prior studies. Nasogastric tube is looped in the proximal stomach which appears decompressed. There is faint contrast material within the duodenum which remains markedly dilated. Some contrast has passed into the colon which is normal in caliber. There is residual faint contrast material in the bladder from previous CT. No extravasated contrast or signs of free air. The bones appear unremarkable. IMPRESSION: Some contrast has passed into the colon which is normal in caliber. However, there is persistent significant dilatation of the duodenum consistent with a high-grade partial bowel obstruction as seen on CT. Electronically Signed   By: Richardean Sale M.D.   On: 11/15/2020 09:31    Scheduled Meds:  bisacodyl  10 mg Rectal Daily   Chlorhexidine Gluconate Cloth  6 each Topical Daily   enoxaparin (LOVENOX) injection  60 mg Subcutaneous Q12H   lip balm  1 application Topical BID   sodium chloride flush  10-40 mL Intracatheter Q12H  Continuous Infusions:  dextrose 5 % and 0.45 % NaCl with KCl 40 mEq/L 100 mL/hr at 11/17/20 0840   methocarbamol (ROBAXIN) IV 1,000 mg (11/16/20 2237)   ondansetron (ZOFRAN) IV       LOS: 3 days   Time spent: 35 minutes  Little Ishikawa, DO [facility]   If 7PM-7AM, please contact night-coverage [email or web address]  11/17/2020, 9:06 AM

## 2020-11-17 NOTE — Progress Notes (Signed)
NG tube clamped from 0830-1600 as ordered. Pt c/o significant L shoulder pain and a feeling of fullness in the abdomen. NG reconnected to low wall suction; 82m of green liquid drained. Pt states his shoulder pain is immediately resolved and feeling of fullness has dissipated. Dr. MKaylyn Limnotified. Orders received to keep NG to LIWS. Pt and wife updated. Will continue to monitor. ACoolidge Breeze RN 11/17/2020

## 2020-11-17 NOTE — Plan of Care (Signed)
  Problem: Coping: Goal: Level of anxiety will decrease Outcome: Progressing   Problem: Safety: Goal: Ability to remain free from injury will improve Outcome: Progressing   Problem: Education: Goal: Knowledge of General Education information will improve Description: Including pain rating scale, medication(s)/side effects and non-pharmacologic comfort measures Outcome: Progressing

## 2020-11-17 NOTE — Progress Notes (Signed)
Daily Progress Note   Patient Name: Frank Garrett       Date: 11/17/2020 DOB: Dec 21, 1960  Age: 60 y.o. MRN#: [id number] Attending Physician: Little Ishikawa, MD Primary Care Physician: Gregor Hams, FNP Admit Date: 11/14/2020 Length of Stay: 3 days  Reason for Consultation/Follow-up: Establishing goals of care  HPI/Patient Profile:  60 y.o. 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  General surgery was consulted and the patient was admitted for work-up and treatment.  SBO protocol initiated including NG tube placement.  Surgery saw the patient and felt that he certainly is having progressive metastatic disease leading to malignant bowel obstruction, no need for emergency surgery.  Question possibility for palliative G-tube.   Also incidentally COVID-19 positive.   Palliative medicine was consulted for goals of care discussion.  Subjective:   Subjective: Chart Reviewed. Updates received. Patient Assessed. Created space and opportunity for patient  and family to explore thoughts and feelings regarding current medical situation.  Today's Discussion: Went into the room the patient was resting/sleeping.  His wife was present at bedside.  Discussed patient symptoms/last 24 hours with her and she states that his chest discomfort is improved, but think it was just anxiety about him being worked up over possibly clamping the NG tube and his fears and paranoia around this.  Since starting Ativan he has calmed down substantially and has gotten good rest over the past day.  At 12 noon today it will be 24 hours since clamping of his tube.  He did have a bowel movement yesterday.  Overall they feel that his extraction is working itself out and he is getting better from that perspective.  I asked about power of  attorney paperwork and she states that she forgot to bring it in.  She is going to check her computer today and consented to me if it is there.  I have advised her to notify the nurse who can contact me via secure chat to provide email address for her to send POA to me.    Answered all questions, addressed all concerns.  No further issues this morning. Offered continued support to patient/family.  Review of Systems  Unable to perform ROS: Other  Patient sleeping, did not wake him  Objective:   Physical Exam: Physical Exam Vitals and nursing note reviewed.  Constitutional:      General: He is sleeping. He is not in acute distress.    Appearance: He is cachectic. He is ill-appearing.  HENT:     Head: Normocephalic and atraumatic.  Pulmonary:     Effort: Pulmonary effort is normal. No respiratory distress.  Skin:    General: Skin is warm and dry.    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  He was recently started on palliative chemotherapy at the cancer treatment center in [place], [place].  He states the goal was to stop spread of the disease rather than cure.  However, it seems apparent that his disease is progressing regardless.  Further time would likely be more revealing.  He does have an SBO and initial talks with surgery  for venting G-tube, although this was not pursued at this time.  He declined to discuss GOC/Code Status; wife states she will bring in [facility] paperwork Seems to be improving from SBO standpoint, NGT clamped with not worsening symptoms as of yet. He has had a bowel movement  SUMMARY OF RECOMMENDATIONS   Continue to treat the treatable When his wife brings [facility] POA paperwork and we will scan this into the system Hold off on  further goals of care discussion for now given patient declined and became upset about this May need further [place] discussion if any significant clinical change Continue Ativan as ordered for anxiety PMT will continue to follow  Code Status: Full code  Prognosis: Unable to determine  Discharge Planning: To Be Determined  Discussed with: Dr. Avon Gully, nursing staff, patient family  Thank you for allowing [place] to participate in the care of Rivka Barbara PMT will continue to support holistically.  Time Total: 35 min  Visit consisted of counseling and education dealing with the complex and emotionally intense issues of symptom management and palliative care in the setting of serious and potentially life-threatening illness. Greater than 50%  of this time was spent counseling and coordinating care related to the above assessment and plan.  Walden Field, NP Palliative Medicine Team  Team Phone # [phone] (Nights/Weekends)  11/09/2020, 8:17 AM

## 2020-11-17 NOTE — Progress Notes (Signed)
CC: Nausea, vomiting, history of pancreatic cancer with metastasis.  Subjective: He did well with the NG clamping.  Even though his volumes are still high.  He is having bowel movements the last one was this morning.  He was anxious to have more fluids.  He was scheduled to see his oncologist tomorrow in [place] but that has been postponed.  His wife plans to take him down there after he is done here.  Objective: Vital signs in last 24 hours: Temp:  [97.3 F (36.3 C)-99.6 F (37.6 C)] 99.6 F (37.6 C) (09/07 0519) Pulse Rate:  [85-100] 92 (09/07 0519) Resp:  [18-20] 20 (09/07 0519) BP: (106-124)/(83-84) 106/83 (09/07 0519) SpO2:  [100 %] 100 % (09/07 0519) Last BM Date: 11/16/20 960 p.o. recorded 1139 IV record 350 urine 2750 NG, 750 the last 12 hours. No BM recorded Afebrile vital signs are stable Creatinine up to 1.46 this morning.,  Alk phos 256, magnesium 2.8, WBC 2.9 Intake/Output from previous day: 09/06 0701 - 09/07 0700 In: 2099 [P.O.:960; I.V.:1039; IV Piggyback:100] Out: 3100 [Urine:350; Emesis/NG output:2750] Intake/Output this shift: No intake/output data recorded.  General appearance: alert, cooperative, and no distress Resp: Clear anterior GI: His abdomen is nondistended, he has bowel sounds, he is having bowel movements.  Lab Results:  Recent Labs    11/16/20 0318 11/17/20 0408  WBC 2.5* 2.9*  HGB 9.5* 9.7*  HCT 29.8* 31.2*  PLT 240 269    BMET Recent Labs    11/16/20 0318 11/17/20 0408  NA 148* 145  K 3.6 3.8  CL 112* 108  CO2 25 29  GLUCOSE 105* 137*  BUN 28* 36*  CREATININE 1.25* 1.46*  CALCIUM 10.1 10.0   PT/INR No results for input(s): LABPROT, INR in the last 72 hours.  Recent Labs  Lab 11/14/20 1152 11/16/20 0318 11/17/20 0408  AST 78* 49* 41  ALT 79* 87* 85*  ALKPHOS 255* 288* 256*  BILITOT 3.0* 1.5* 1.1  PROT 8.4* 8.6* 8.5*  ALBUMIN 3.7 3.7 3.7     Lipase     Component Value Date/Time   LIPASE 207 (H)  11/14/2020 1152     Medications:  bisacodyl  10 mg Rectal Daily   Chlorhexidine Gluconate Cloth  6 each Topical Daily   enoxaparin (LOVENOX) injection  60 mg Subcutaneous Q12H   lip balm  1 application Topical BID   sodium chloride flush  10-40 mL Intracatheter Q12H    dextrose 5 % and 0.45 % NaCl with KCl 40 mEq/L 100 mL/hr at 11/17/20 0840   methocarbamol (ROBAXIN) IV 1,000 mg (11/16/20 2237)   ondansetron (ZOFRAN) IV       Assessment/Plan HD#3 - 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  Also given giving him a fluid bolus, due to his increasing creatinine.  If he does well with clears today we will advance him to  full liquids tomorrow.  He has daily labs already ordered.     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  1.25>> 1.46 Leukopenia  -WBC 2.5>> 2.5>> 2.9   Paliative Care recommendations   Continue to treat the treatable When his wife brings [facility] POA paperwork and we will scanned this into the system Hold off on further goals of care discussion for now May need  further [place] discussion if any significant clinical change PMT will continue to follow       LOS: 3 days    Hailee Hollick 11/17/2020 Please see Amion

## 2020-11-18 ENCOUNTER — Inpatient Hospital Stay (HOSPITAL_COMMUNITY): Payer: Commercial Managed Care - PPO

## 2020-11-18 LAB — CBC WITH DIFFERENTIAL/PLATELET
Abs Immature Granulocytes: 0 10*3/uL (ref 0.00–0.07)
Basophils Absolute: 0 10*3/uL (ref 0.0–0.1)
Basophils Relative: 0 %
Eosinophils Absolute: 0 10*3/uL (ref 0.0–0.5)
Eosinophils Relative: 1 %
HCT: 30.2 % — ABNORMAL LOW (ref 39.0–52.0)
Hemoglobin: 9.2 g/dL — ABNORMAL LOW (ref 13.0–17.0)
Immature Granulocytes: 0 %
Lymphocytes Relative: 41 %
Lymphs Abs: 1.3 10*3/uL (ref 0.7–4.0)
MCH: 29.1 pg (ref 26.0–34.0)
MCHC: 30.5 g/dL (ref 30.0–36.0)
MCV: 95.6 fL (ref 80.0–100.0)
Monocytes Absolute: 1 10*3/uL (ref 0.1–1.0)
Monocytes Relative: 33 %
Neutro Abs: 0.8 10*3/uL — ABNORMAL LOW (ref 1.7–7.7)
Neutrophils Relative %: 25 %
Platelets: 298 10*3/uL (ref 150–400)
RBC: 3.16 MIL/uL — ABNORMAL LOW (ref 4.22–5.81)
RDW: 18.2 % — ABNORMAL HIGH (ref 11.5–15.5)
WBC: 3.1 10*3/uL — ABNORMAL LOW (ref 4.0–10.5)
nRBC: 1.6 % — ABNORMAL HIGH (ref 0.0–0.2)

## 2020-11-18 LAB — COMPREHENSIVE METABOLIC PANEL
ALT: 59 U/L — ABNORMAL HIGH (ref 0–44)
AST: 26 U/L (ref 15–41)
Albumin: 3.2 g/dL — ABNORMAL LOW (ref 3.5–5.0)
Alkaline Phosphatase: 202 U/L — ABNORMAL HIGH (ref 38–126)
Anion gap: 6 (ref 5–15)
BUN: 23 mg/dL — ABNORMAL HIGH (ref 6–20)
CO2: 25 mmol/L (ref 22–32)
Calcium: 9.2 mg/dL (ref 8.9–10.3)
Chloride: 114 mmol/L — ABNORMAL HIGH (ref 98–111)
Creatinine, Ser: 1.14 mg/dL (ref 0.61–1.24)
GFR, Estimated: >60 mL/min (ref >60)
Glucose, Bld: 134 mg/dL — ABNORMAL HIGH (ref 70–99)
Potassium: 4.2 mmol/L (ref 3.5–5.1)
Sodium: 145 mmol/L (ref 135–145)
Total Bilirubin: 1 mg/dL (ref 0.3–1.2)
Total Protein: 7.5 g/dL (ref 6.5–8.1)

## 2020-11-18 LAB — D-DIMER, QUANTITATIVE: D-Dimer, Quant: 3.09 ug/mL-FEU — ABNORMAL HIGH (ref 0.00–0.50)

## 2020-11-18 LAB — C-REACTIVE PROTEIN: CRP: 0.8 mg/dL (ref <1.0)

## 2020-11-18 LAB — FERRITIN: Ferritin: 683 ng/mL — ABNORMAL HIGH (ref 24–336)

## 2020-11-18 NOTE — Progress Notes (Signed)
CC: Nausea, vomiting, history of Saralyn Pilar periodic cancer with metastasis.  Subjective: He failed clamping yesterday, and is back on suction.  He is very aware of the discomfort with the NG clamped and the relief when the suction was resumed.  I talked with him and his wife and showed them the original CT and showed them how he was dilated proximally, and decompressed distally.  We also showed him the abnormalities within the liver.  Objective: Vital signs in last 24 hours: Temp:  [98.4 F (36.9 C)-99.3 F (37.4 C)] 99.1 F (37.3 C) (09/08 0630) Pulse Rate:  [81-89] 89 (09/08 0630) Resp:  [15-18] 18 (09/08 0630) BP: (107-117)/(72-84) 107/72 (09/08 0630) SpO2:  [100 %] 100 % (09/08 0630) Last BM Date: 11/17/20 270 p.o. recorded 2205 IV Urine x2 NG 1700 T-max 99.3 CMP is stable, creatinine 114, glucose 134, BUN 23, creatinine is down to 1.14, alk phos 202, albumin 3.2, ALT 59.  WBC 3.1  D-dimer 3.09  Intake/Output from previous day: 09/07 0701 - 09/08 0700 In: 2475.9 [P.O.:270; I.V.:2105.9; IV Piggyback:100] Out: 1700 [Emesis/NG output:1700] Intake/Output this shift: Total I/O In: -  Out: 250 [Emesis/NG output:250]  General appearance: alert, cooperative, no distress, and concerned he may not be be able to go home. Resp: clear to auscultation bilaterally GI: Currently he is decompressed abdomen soft nontender, NG is draining green bilious fluid even though he is taking some ice chips.  He was having loose stools yesterday but none so far today.  Lab Results:  Recent Labs    11/17/20 0408 11/18/20 0454  WBC 2.9* 3.1*  HGB 9.7* 9.2*  HCT 31.2* 30.2*  PLT 269 298    BMET Recent Labs    11/17/20 0408 11/18/20 0454  NA 145 145  K 3.8 4.2  CL 108 114*  CO2 29 25  GLUCOSE 137* 134*  BUN 36* 23*  CREATININE 1.46* 1.14  CALCIUM 10.0 9.2   PT/INR No results for input(s): LABPROT, INR in the last 72 hours.  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  He had a good deal of relief after the NG was placed back on suction.  Currently he is comfortable with the NG suction in place.  His wife is going to  contact his oncologist in [place] and discussed the situation with them.  We are going to get an upper GI, and make further decisions after the upper GI.  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  1.25>> 1.46 Leukopenia  -WBC 2.5>> 2.5>> 2.9   Paliative Care recommendations   Continue to treat the treatable When his wife brings  [facility] POA paperwork and we will scanned this into the system Hold off on further goals of care discussion for now May need further [place] discussion if any significant clinical change PMT will continue to follow         LOS: 4 days    Sabrine Patchen 11/18/2020 Please see Amion

## 2020-11-18 NOTE — Progress Notes (Signed)
PROGRESS NOTE    Frank Garrett  [id number] DOB: 06/25/1960 DOA: 11/14/2020 PCP: Gregor Hams, FNP    Brief Narrative:  60 year old male with a history of metastatic pancreatic cancer, admitted to the hospital with intractable nausea and vomiting.  He was found to have small bowel obstruction and was admitted for further management.  He had NG tube placed for decompression.  General surgery consulted.  Assessment & Plan:  Small bowel obstruction, ongoing -Currently has NG tube for decompression - was restarted 11/17/20 after worsening abdominal pain/distention -General surgery following -appreciate insight and recommendations -No bowel movement over the past 24 hours with multiple episodes of flatus, potential upper imaging series or endoscopy per surgery, they will consult GI for further recommendations given imaging shows high transition point, may be able to reduce with enteroscopy -Ongoing concern that this is due to metastatic disease, we will continue conservative treatment as above in the meantime  Metastatic pancreatic cancer -Patient receives his care with cancer treatment centers of [place] in [place] -Palliative care consulted, but patient did not wish to discuss goals of care at this time and focus on treatment including aggressive measures  Hypernatremia, resolved -likely related to high output from NG tube -start on hypotonic fluids  AKI, resolving -Likely in setting of above, continue IV fluids while p.o. 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 -Continue therapeutic Lovenox -D-dimer elevated in the setting of COVID, no indication for imaging given treatment continues as above  with full dose anticoagulation  Right shoulder pain -In the setting of musculoskeletal strain/sprain, worse with range of motion -Improved with robaxin  Goals of care -appreciate palliative care input -currently full code   DVT prophylaxis:   Therapeutic Lovenox  Code Status: Full code Family Communication: Discussed with wife at the bedside Disposition Plan: Status is: Inpatient  Remains inpatient appropriate because:Ongoing diagnostic testing needed not appropriate for outpatient work up, IV treatments appropriate due to intensity of illness or inability to take PO, and Inpatient level of care appropriate due to severity of illness  Dispo: The patient is from: Home              Anticipated d/c is to: Home              Patient currently is not medically stable to d/c.   Difficult to place patient No  Consultants:  General surgery Palliative care  Subjective: Worsening abdominal and left shoulder pain yesterday late, NG tube reinserted with immediate removal of large volume.  Nausea vomiting abdominal pain shoulder pain have resolved since reinitiation of NG tube intermittent suction.  Patient otherwise somewhat anxious and stressed about his current situation but otherwise in good spirits.  Objective: Vitals:   11/17/20 0519 11/17/20 1728 11/17/20 2018 11/18/20 0630  BP: 106/83 117/84 115/82 107/72  Pulse: 92 84 81 89  Resp: '20 18 15 18  '$ Temp: 99.6 F (37.6 C) 98.4 F (36.9 C) 99.3 F (37.4 C) 99.1 F (37.3 C)  TempSrc: Oral Oral Oral Oral  SpO2: 100% 100% 100% 100%  Weight:      Height:        Intake/Output Summary (Last 24 hours) at 11/18/2020 0751 Last data filed at 11/18/2020 0300 Gross per 24 hour  Intake 2475.86 ml  Output 1700 ml  Net 775.86 ml    Filed Weights   11/14/20 1839  Weight: 57.7 kg    Examination:  General exam: Alert, awake, oriented x 3 Respiratory system: Clear to auscultation. Respiratory effort normal. Cardiovascular system:RRR. No  murmurs, rubs, gallops. Gastrointestinal system: Abdomen is nondistended, soft and nontender. No organomegaly or masses felt. Normal bowel sounds heard. NG tube in place to suction Central nervous system: Alert and oriented. No focal neurological deficits. Extremities: No C/C/E, +pedal pulses Skin: No rashes, lesions or ulcers Psychiatry: Judgement and insight appear normal. Mood & affect appropriate.    Data Reviewed: I have personally reviewed following labs and imaging studies  CBC: Recent Labs  Lab 11/14/20 1108 11/15/20 0325 11/16/20 0318 11/17/20 0408 11/18/20 0454  WBC 2.1* 2.5* 2.5* 2.9* 3.1*  NEUTROABS 0.8* 0.7* 0.6* 0.7* 0.8*  HGB 8.8* 8.0* 9.5* 9.7* 9.2*  HCT 28.4* 25.2* 29.8* 31.2* 30.2*  MCV 93.7 92.6 92.3 93.7 95.6  PLT 198 191 240 269 [id number]    Basic Metabolic Panel: Recent Labs  Lab 11/14/20 1152 11/15/20 0325 11/16/20 0318 11/17/20 0408 11/18/20 0454  NA 140 142 148* 145 145  K 4.1 3.6 3.6 3.8 4.2  CL 112* 109 112* 108 114*  CO2 19* '24 25 29 25  '$ GLUCOSE 123* 95 105* 137* 134*  BUN 16 18 28* 36* 23*  CREATININE 0.92 1.01 1.25* 1.46* 1.14  CALCIUM 9.7 9.4 10.1 10.0 9.2  MG  --   --   --  2.8*  --     GFR: Estimated Creatinine Clearance: 57.6 mL/min (by C-G formula based on SCr of 1.14 mg/dL). Liver Function Tests: Recent Labs  Lab 11/14/20 1152 11/16/20 0318 11/17/20 0408 11/18/20 0454  AST 78* 49* 41 26  ALT 79* 87* 85* 59*  ALKPHOS 255* 288* 256* 202*  BILITOT 3.0* 1.5* 1.1 1.0  PROT 8.4* 8.6* 8.5* 7.5  ALBUMIN 3.7 3.7 3.7 3.2*    Recent Labs  Lab 11/14/20 1152  LIPASE 207*    No results for input(s): AMMONIA in the last 168 hours. Coagulation Profile: No results for input(s): INR, PROTIME in the last 168 hours. Cardiac Enzymes: No results for input(s): CKTOTAL, CKMB, CKMBINDEX, TROPONINI in the last 168 hours. BNP (last 3 results) No results for input(s): PROBNP in the last 8760 hours. HbA1C: No results for input(s): HGBA1C in  the last 72 hours. CBG: No results for input(s): GLUCAP in the last 168 hours. Lipid Profile: No results for input(s): CHOL, HDL, LDLCALC, TRIG, CHOLHDL, LDLDIRECT in the last 72 hours. Thyroid Function Tests: No results for input(s): TSH, T4TOTAL, FREET4, T3FREE, THYROIDAB in the last 72 hours. Anemia Panel: Recent Labs    11/17/20 0408 11/18/20 0454  FERRITIN 757* 683*    Sepsis Labs: Recent Labs  Lab 11/14/20 1152  LATICACIDVEN 2.3*     Recent Results (from the past 240 hour(s))  Resp Panel by RT-PCR (Flu A&B, Covid) Nasopharyngeal Swab     Status: Abnormal   Collection Time: 11/14/20 12:58 PM   Specimen: Nasopharyngeal Swab; Nasopharyngeal(NP) swabs in vial transport medium  Result Value Ref Range Status   SARS Coronavirus 2 by RT PCR POSITIVE (A) NEGATIVE Final    Comment: RESULT CALLED TO, READ BACK BY AND VERIFIED WITH: Lenn Cal, RN [phone] KDS (NOTE) SARS-CoV-2 target nucleic acids are DETECTED.  The SARS-CoV-2 RNA is generally detectable in upper respiratory specimens during the acute phase of infection. Positive results are indicative of the presence of the  identified virus, but do not rule out bacterial infection or co-infection with other pathogens not detected by the test. Clinical correlation with patient history and other diagnostic information is necessary to determine patient infection status. The expected result is Negative.  Fact Sheet for Patients: EntrepreneurPulse.com.au  Fact Sheet for Healthcare Providers: IncredibleEmployment.be  This test is not yet approved or cleared by the [place] FDA and  has been authorized for detection and/or diagnosis of SARS-CoV-2 by FDA under an Emergency Use Authorization (EUA).  This EUA will remain in effect (meaning this test can be used)  for the duration of  the COVID-19 declaration under Section 564(b)(1) of the Act, 21 U.S.C. section 360bbb-3(b)(1), unless the  authorization is terminated or revoked sooner.     Influenza A by PCR NEGATIVE NEGATIVE Final   Influenza B by PCR NEGATIVE NEGATIVE Final    Comment: (NOTE) The Xpert Xpress SARS-CoV-2/FLU/RSV plus assay is intended as an aid in the diagnosis of influenza from Nasopharyngeal swab specimens and should not be used as a sole basis for treatment. Nasal washings and aspirates are unacceptable for Xpert Xpress SARS-CoV-2/FLU/RSV testing.  Fact Sheet for Patients: EntrepreneurPulse.com.au  Fact Sheet for Healthcare Providers: IncredibleEmployment.be  This test is not yet approved or cleared by the [place] FDA and has been authorized for detection and/or diagnosis of SARS-CoV-2 by FDA under an Emergency Use Authorization (EUA). This EUA will remain in effect (meaning this test can be used) for the duration of the COVID-19 declaration under Section 564(b)(1) of the Act, 21 U.S.C. section 360bbb-3(b)(1), unless the authorization is terminated or revoked.  Performed at [facility], [place] [address], [place], [place] [address]      Radiology Studies: DG CHEST PORT 1 VIEW  Result Date: 11/16/2020 CLINICAL DATA:  Emesis EXAM: PORTABLE CHEST 1 VIEW COMPARISON:  Chest x-ray dated Jul 11, 2016 FINDINGS: Left chest wall port with the tip near the superior cavoatrial junction. NG tube partially seen coursing below the diaphragm. The heart size and mediastinal contours are within normal limits. Both lungs are clear. The visualized skeletal structures are unremarkable. IMPRESSION: Lungs are clear. Electronically Signed   By: Yetta Glassman M.D.   On: 11/16/2020 12:50    Scheduled Meds:  bisacodyl  10 mg Rectal Daily   Chlorhexidine Gluconate Cloth  6 each Topical Daily   enoxaparin (LOVENOX) injection  60 mg Subcutaneous Q12H   lip balm  1 application Topical BID   sodium chloride flush  10-40 mL Intracatheter Q12H   Continuous  Infusions:  dextrose 5 % and 0.45 % NaCl with KCl 40 mEq/L 100 mL/hr at 11/18/20 0621   methocarbamol (ROBAXIN) IV 1,000 mg (11/17/20 1313)   ondansetron (ZOFRAN) IV       LOS: 4 days   Time spent: 35 minutes  Little Ishikawa, DO [facility]   If 7PM-7AM, please contact night-coverage [email or web address]  11/18/2020, 7:51 AM

## 2020-11-19 ENCOUNTER — Inpatient Hospital Stay (HOSPITAL_COMMUNITY): Payer: Commercial Managed Care - PPO

## 2020-11-19 LAB — CBC WITH DIFFERENTIAL/PLATELET
Abs Immature Granulocytes: 0.01 10*3/uL (ref 0.00–0.07)
Basophils Absolute: 0 10*3/uL (ref 0.0–0.1)
Basophils Relative: 1 %
Eosinophils Absolute: 0 10*3/uL (ref 0.0–0.5)
Eosinophils Relative: 1 %
HCT: 28.9 % — ABNORMAL LOW (ref 39.0–52.0)
Hemoglobin: 8.8 g/dL — ABNORMAL LOW (ref 13.0–17.0)
Immature Granulocytes: 0 %
Lymphocytes Relative: 36 %
Lymphs Abs: 1.4 10*3/uL (ref 0.7–4.0)
MCH: 29.1 pg (ref 26.0–34.0)
MCHC: 30.4 g/dL (ref 30.0–36.0)
MCV: 95.7 fL (ref 80.0–100.0)
Monocytes Absolute: 1 10*3/uL (ref 0.1–1.0)
Monocytes Relative: 26 %
Neutro Abs: 1.4 10*3/uL — ABNORMAL LOW (ref 1.7–7.7)
Neutrophils Relative %: 36 %
Platelets: 357 10*3/uL (ref 150–400)
RBC: 3.02 MIL/uL — ABNORMAL LOW (ref 4.22–5.81)
RDW: 18.2 % — ABNORMAL HIGH (ref 11.5–15.5)
WBC: 3.8 10*3/uL — ABNORMAL LOW (ref 4.0–10.5)
nRBC: 0.5 % — ABNORMAL HIGH (ref 0.0–0.2)

## 2020-11-19 LAB — FERRITIN: Ferritin: 663 ng/mL — ABNORMAL HIGH (ref 24–336)

## 2020-11-19 LAB — COMPREHENSIVE METABOLIC PANEL
ALT: 44 U/L (ref 0–44)
AST: 20 U/L (ref 15–41)
Albumin: 3.1 g/dL — ABNORMAL LOW (ref 3.5–5.0)
Alkaline Phosphatase: 179 U/L — ABNORMAL HIGH (ref 38–126)
Anion gap: 6 (ref 5–15)
BUN: 16 mg/dL (ref 6–20)
CO2: 27 mmol/L (ref 22–32)
Calcium: 9 mg/dL (ref 8.9–10.3)
Chloride: 105 mmol/L (ref 98–111)
Creatinine, Ser: 1.02 mg/dL (ref 0.61–1.24)
GFR, Estimated: >60 mL/min (ref >60)
Glucose, Bld: 131 mg/dL — ABNORMAL HIGH (ref 70–99)
Potassium: 4.5 mmol/L (ref 3.5–5.1)
Sodium: 138 mmol/L (ref 135–145)
Total Bilirubin: 0.7 mg/dL (ref 0.3–1.2)
Total Protein: 7.1 g/dL (ref 6.5–8.1)

## 2020-11-19 LAB — D-DIMER, QUANTITATIVE: D-Dimer, Quant: 2.94 ug/mL-FEU — ABNORMAL HIGH (ref 0.00–0.50)

## 2020-11-19 LAB — C-REACTIVE PROTEIN: CRP: 1.5 mg/dL — ABNORMAL HIGH (ref <1.0)

## 2020-11-19 NOTE — Progress Notes (Signed)
Progress Note     Subjective: Discussed findings of imaging and plans for possible GI intervention with patient and wife who are very appreciative of all the care they have gotten. He denies abdominal pain this AM.   Objective: Vital signs in last 24 hours: Temp:  [98.5 F (36.9 C)-99.5 F (37.5 C)] 98.7 F (37.1 C) (09/09 0408) Pulse Rate:  [71-72] 72 (09/09 0408) Resp:  [16-18] 18 (09/09 0408) BP: (103-107)/(73-78) 103/73 (09/09 0408) SpO2:  [99 %-100 %] 99 % (09/09 0408) Last BM Date: 11/17/20  Intake/Output from previous day: 09/08 0701 - 09/09 0700 In: 2502.5 [I.V.:2502.5] Out: 1200 [Urine:250; Emesis/NG output:950] Intake/Output this shift: No intake/output data recorded.  PE: General appearance: alert, cooperative, no distress GI: abdomen soft, nontender, NG is draining green bilious fluid   Lab Results:  Recent Labs    11/18/20 0454 11/19/20 0430  WBC 3.1* 3.8*  HGB 9.2* 8.8*  HCT 30.2* 28.9*  PLT 298 357   BMET Recent Labs    11/18/20 0454 11/19/20 0430  NA 145 138  K 4.2 4.5  CL 114* 105  CO2 25 27  GLUCOSE 134* 131*  BUN 23* 16  CREATININE 1.14 1.02  CALCIUM 9.2 9.0   PT/INR No results for input(s): LABPROT, INR in the last 72 hours. CMP     Component Value Date/Time   NA 138 11/19/2020 0430   K 4.5 11/19/2020 0430   CL 105 11/19/2020 0430   CO2 27 11/19/2020 0430   GLUCOSE 131 (H) 11/19/2020 0430   BUN 16 11/19/2020 0430   CREATININE 1.02 11/19/2020 0430   CALCIUM 9.0 11/19/2020 0430   PROT 7.1 11/19/2020 0430   ALBUMIN 3.1 (L) 11/19/2020 0430   AST 20 11/19/2020 0430   ALT 44 11/19/2020 0430   ALKPHOS 179 (H) 11/19/2020 0430   BILITOT 0.7 11/19/2020 0430   GFRNONAA >60 11/19/2020 0430   GFRAA >60 11/11/2016 0404   Lipase     Component Value Date/Time   LIPASE 207 (H) 11/14/2020 1152       Studies/Results: DG Abd 2 Views  Result Date: 11/18/2020 CLINICAL DATA:  Small bowel obstruction. EXAM: ABDOMEN - 2 VIEW  COMPARISON:  11/16/2020 FINDINGS: NG tube is looped in the stomach. Lung bases are unremarkable. No gaseous small bowel dilatation on the current study. Contrast material seen in the colon previously has passed in the interval. Visualized bony anatomy unremarkable. IMPRESSION: No evidence for gaseous small bowel dilatation on the current study. Electronically Signed   By: Misty Stanley M.D.   On: 11/18/2020 11:51   DG UGI W SINGLE CM (SOL OR THIN BA)  Result Date: 11/18/2020 CLINICAL DATA:  CT demonstrating small-bowel obstruction at the duodenal/jejunal junction. History of Whipple procedure for pancreatic cancer in 2018 EXAM: UPPER GI SERIES WITH KUB TECHNIQUE: After obtaining a scout radiograph a focused upper GI series was performed using thin barium FLUOROSCOPY TIME:  Fluoroscopy Time:  5 minutes and 5 seconds Radiation Exposure Index (if provided by the fluoroscopic device): Number of Acquired Spot Images: 0 COMPARISON:  CT of 11/14/2020 FINDINGS: Preprocedure scout film demonstrates a nasogastric tube in the proximal stomach. No residual gastric distension. Administration of 180 cc of dilute barium with placement in supine and right-side-down decubitus imaging demonstrates normal caliber of the stomach. The duodenal C loop is dilated, on the order of 3.3 cm. Despite contrast administration and repositioning, contrast does not fill the proximal jejunum. IMPRESSION: Resolution of gastric obstruction, in the setting of  nasogastric tube placement. Residual duodenal dilatation with lack of contrast passage into the jejunum. Findings again favor high-grade obstruction at the duodenal/jejunal junction. Consider plain film follow-up to evaluate for eventual opacification of the jejunum. Electronically Signed   By: Abigail Miyamoto M.D.   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 - UGI with obstruction at level of duodenal/jejunal junction - GI consulted to evaluate for possible stent placement and planning possibly early next week - film this AM with passage of contrast to colon - no indication for emergent surgical intervention, hopefully can avoid any surgical intervention if able to stent - surgery will follow peripherally through the weekend and see again Monday    FEN: ice chips/sips, IVF per TRH, NGT to LIWS ID: None DVT: Lovenox    COVID-positive -asymptomatic  Hypertension Chronic diarrhea GERD Dehydration - Cr 1.02 Leukopenia -  WBC 3.8   Paliative Care recommendations   Continue to treat the treatable When his wife brings [facility] POA paperwork and we will scanned this into the system Hold off on further goals of care discussion for now May need further [place] discussion if any significant clinical change PMT will continue to follow  LOS: 5 days    Norm Parcel, [facility] Surgery 11/19/2020, 11:26 AM Please see Amion for pager number during day hours 7:00am-4:30pm

## 2020-11-19 NOTE — Progress Notes (Signed)
PROGRESS NOTE    Frank Garrett  [id number] DOB: Apr 26, 1960 DOA: 11/14/2020 PCP: Gregor Hams, FNP    Brief Narrative:  60 year old male with a history of metastatic pancreatic cancer, admitted to the hospital with intractable nausea and vomiting.  He was found to have small bowel obstruction and was admitted for further management.  He had NG tube placed for decompression.  General surgery consulted.  Assessment & Plan:  Small bowel obstruction, ongoing -Currently has NG tube for decompression - was restarted 11/17/20 after worsening abdominal pain/distention -General surgery following -appreciate insight and recommendations -Continues to have occasional small bowel movements and multiple episodes of flatus -Repeat imaging shows a more proximal duodenal/jejunal obstruction, surgery requesting GI evaluation for possible stenting given location, tentative plan for small bowel stent 11/23/2020 pending clearance and clinical status -Ongoing concern that this is due to metastatic disease, we will continue conservative treatment as above in the meantime -TPN to start 11/20/2020 given prolonged SBO and inability to take p.o. adequately  Metastatic pancreatic cancer -Patient receives his care with [facility] [place] in [place] -awaiting callback from this group to further discuss possible need for in-house consult here versus outpatient follow-up -Palliative care consulted, but patient did not wish to discuss goals of care at this time and focus on treatment including aggressive measures  Hypernatremia, resolved -likely related to high output from NG tube -Continue IV fluids, initiate TPN per pharmacy consult to start 11/20/2020  AKI, resolving -TPN to initiate 11/20/2020  Incidentally COVID-positive, asymptomatic, unclear timeframe -No respiratory symptoms or fevers -Inflammatory markers flat, minimally elevated -Treat supportively at this time  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 -Continue therapeutic Lovenox -D-dimer elevated in the setting of COVID, no indication for imaging given treatment continues as above with full dose anticoagulation  Right shoulder pain -In the setting of musculoskeletal strain/sprain, worse with range of motion -Improved with robaxin  Goals of care -appreciate palliative care input -currently full code   DVT prophylaxis:   Therapeutic Lovenox  Code Status: Full code Family Communication: Discussed with wife at the bedside Disposition Plan: Status is: Inpatient  Remains inpatient appropriate because:Ongoing diagnostic testing needed not appropriate for outpatient work up, IV treatments appropriate due to intensity of illness or inability to take PO, and Inpatient level of care appropriate due to severity of illness  Dispo: The patient is from: Home              Anticipated d/c is to: Home              Patient currently is not medically stable to d/c.   Difficult to place patient No  Consultants:  General surgery Palliative care GI  Subjective: No acute issues or events overnight, denies headache fever chills nausea vomiting diarrhea, multiple episodes of flatus with single episode of loose bowel movement yesterday  Objective: Vitals:   11/18/20 0630 11/18/20 1339 11/18/20 2027 11/19/20 0408  BP: 107/72 104/78 107/74 103/73  Pulse: 89 72 71 72  Resp: '18 16 18 18  '$ Temp: 99.1 F (37.3 C) 98.5 F (36.9 C) 99.5 F (37.5 C) 98.7 F (37.1 C)  TempSrc: Oral Oral Oral Oral  SpO2: 100% 99% 100% 99%  Weight:      Height:        Intake/Output Summary (Last 24 hours) at 11/19/2020 0729 Last data filed at 11/19/2020 0600 Gross per 24 hour  Intake 2502.53 ml  Output 1200 ml  Net 1302.53  ml    Filed Weights   11/14/20 1839  Weight: 57.7 kg    Examination:  General exam: Alert, awake, oriented x 3 Respiratory system: Clear to auscultation. Respiratory effort  normal. Cardiovascular system:RRR. No murmurs, rubs, gallops. Gastrointestinal system: Abdomen is nondistended, soft and nontender. No organomegaly or masses felt. Normal bowel sounds heard. NG tube in place to suction Central nervous system: Alert and oriented. No focal neurological deficits. Extremities: No C/C/E, +pedal pulses Skin: No rashes, lesions or ulcers Psychiatry: Judgement and insight appear normal. Mood & affect appropriate.    Data Reviewed: I have personally reviewed following labs and imaging studies  CBC: Recent Labs  Lab 11/15/20 0325 11/16/20 0318 11/17/20 0408 11/18/20 0454 11/19/20 0430  WBC 2.5* 2.5* 2.9* 3.1* 3.8*  NEUTROABS 0.7* 0.6* 0.7* 0.8* 1.4*  HGB 8.0* 9.5* 9.7* 9.2* 8.8*  HCT 25.2* 29.8* 31.2* 30.2* 28.9*  MCV 92.6 92.3 93.7 95.6 95.7  PLT 191 240 269 298 [id number]    Basic Metabolic Panel: Recent Labs  Lab 11/15/20 0325 11/16/20 0318 11/17/20 0408 11/18/20 0454 11/19/20 0430  NA 142 148* 145 145 138  K 3.6 3.6 3.8 4.2 4.5  CL 109 112* 108 114* 105  CO2 '24 25 29 25 27  '$ GLUCOSE 95 105* 137* 134* 131*  BUN 18 28* 36* 23* 16  CREATININE 1.01 1.25* 1.46* 1.14 1.02  CALCIUM 9.4 10.1 10.0 9.2 9.0  MG  --   --  2.8*  --   --     GFR: Estimated Creatinine Clearance: 64.4 mL/min (by C-G formula based on SCr of 1.02 mg/dL). Liver Function Tests: Recent Labs  Lab 11/14/20 1152 11/16/20 0318 11/17/20 0408 11/18/20 0454 11/19/20 0430  AST 78* 49* 41 26 20  ALT 79* 87* 85* 59* 44  ALKPHOS 255* 288* 256* 202* 179*  BILITOT 3.0* 1.5* 1.1 1.0 0.7  PROT 8.4* 8.6* 8.5* 7.5 7.1  ALBUMIN 3.7 3.7 3.7 3.2* 3.1*    Recent Labs  Lab 11/14/20 1152  LIPASE 207*    No results for input(s): AMMONIA in the last 168 hours. Coagulation Profile: No results for input(s): INR, PROTIME in the last 168 hours. Cardiac Enzymes: No results for input(s): CKTOTAL, CKMB, CKMBINDEX, TROPONINI in the last 168 hours. BNP (last 3 results) No results for  input(s): PROBNP in the last 8760 hours. HbA1C: No results for input(s): HGBA1C in the last 72 hours. CBG: No results for input(s): GLUCAP in the last 168 hours. Lipid Profile: No results for input(s): CHOL, HDL, LDLCALC, TRIG, CHOLHDL, LDLDIRECT in the last 72 hours. Thyroid Function Tests: No results for input(s): TSH, T4TOTAL, FREET4, T3FREE, THYROIDAB in the last 72 hours. Anemia Panel: Recent Labs    11/18/20 0454 11/19/20 0430  FERRITIN 683* 663*    Sepsis Labs: Recent Labs  Lab 11/14/20 1152  LATICACIDVEN 2.3*     Recent Results (from the past 240 hour(s))  Resp Panel by RT-PCR (Flu A&B, Covid) Nasopharyngeal Swab     Status: Abnormal   Collection Time: 11/14/20 12:58 PM   Specimen: Nasopharyngeal Swab; Nasopharyngeal(NP) swabs in vial transport medium  Result Value Ref Range Status   SARS Coronavirus 2 by RT PCR POSITIVE (A) NEGATIVE Final    Comment: RESULT CALLED TO, READ BACK BY AND VERIFIED WITH: Lenn Cal, RN [phone] KDS (NOTE) SARS-CoV-2 target nucleic acids are DETECTED.  The SARS-CoV-2 RNA is generally detectable in upper respiratory specimens during the acute phase of infection. Positive results are indicative of the presence  of the identified virus, but do not rule out bacterial infection or co-infection with other pathogens not detected by the test. Clinical correlation with patient history and other diagnostic information is necessary to determine patient infection status. The expected result is Negative.  Fact Sheet for Patients: EntrepreneurPulse.com.au  Fact Sheet for Healthcare Providers: IncredibleEmployment.be  This test is not yet approved or cleared by the [place] FDA and  has been authorized for detection and/or diagnosis of SARS-CoV-2 by FDA under an Emergency Use Authorization (EUA).  This EUA will remain in effect (meaning this test can be used)  for the duration of  the COVID-19  declaration under Section 564(b)(1) of the Act, 21 U.S.C. section 360bbb-3(b)(1), unless the authorization is terminated or revoked sooner.     Influenza A by PCR NEGATIVE NEGATIVE Final   Influenza B by PCR NEGATIVE NEGATIVE Final    Comment: (NOTE) The Xpert Xpress SARS-CoV-2/FLU/RSV plus assay is intended as an aid in the diagnosis of influenza from Nasopharyngeal swab specimens and should not be used as a sole basis for treatment. Nasal washings and aspirates are unacceptable for Xpert Xpress SARS-CoV-2/FLU/RSV testing.  Fact Sheet for Patients: EntrepreneurPulse.com.au  Fact Sheet for Healthcare Providers: IncredibleEmployment.be  This test is not yet approved or cleared by the [place] FDA and has been authorized for detection and/or diagnosis of SARS-CoV-2 by FDA under an Emergency Use Authorization (EUA). This EUA will remain in effect (meaning this test can be used) for the duration of the COVID-19 declaration under Section 564(b)(1) of the Act, 21 U.S.C. section 360bbb-3(b)(1), unless the authorization is terminated or revoked.  Performed at [facility], [place] [address] [place], [place] [address]      Radiology Studies: DG Abd 2 Views  Result Date: 11/18/2020 CLINICAL DATA:  Small bowel obstruction. EXAM: ABDOMEN - 2 VIEW COMPARISON:  11/16/2020 FINDINGS: NG tube is looped in the stomach. Lung bases are unremarkable. No gaseous small bowel dilatation on the current study. Contrast material seen in the colon previously has passed in the interval. Visualized bony anatomy unremarkable. IMPRESSION: No evidence for gaseous small bowel dilatation on the current study. Electronically Signed   By: Misty Stanley M.D.   On: 11/18/2020 11:51   DG UGI W SINGLE CM (SOL OR THIN BA)  Result Date: 11/18/2020 CLINICAL DATA:  CT demonstrating small-bowel obstruction at the duodenal/jejunal junction. History of Whipple procedure  for pancreatic cancer in 2018 EXAM: UPPER GI SERIES WITH KUB TECHNIQUE: After obtaining a scout radiograph a focused upper GI series was performed using thin barium FLUOROSCOPY TIME:  Fluoroscopy Time:  5 minutes and 5 seconds Radiation Exposure Index (if provided by the fluoroscopic device): Number of Acquired Spot Images: 0 COMPARISON:  CT of 11/14/2020 FINDINGS: Preprocedure scout film demonstrates a nasogastric tube in the proximal stomach. No residual gastric distension. Administration of 180 cc of dilute barium with placement in supine and right-side-down decubitus imaging demonstrates normal caliber of the stomach. The duodenal C loop is dilated, on the order of 3.3 cm. Despite contrast administration and repositioning, contrast does not fill the proximal jejunum. IMPRESSION: Resolution of gastric obstruction, in the setting of nasogastric tube placement. Residual duodenal dilatation with lack of contrast passage into the jejunum. Findings again favor high-grade obstruction at the duodenal/jejunal junction. Consider plain film follow-up to evaluate for eventual opacification of the jejunum. Electronically Signed   By: Abigail Miyamoto M.D.   On: 11/18/2020 15:53    Scheduled Meds:  bisacodyl  10  mg Rectal Daily   Chlorhexidine Gluconate Cloth  6 each Topical Daily   enoxaparin (LOVENOX) injection  60 mg Subcutaneous Q12H   lip balm  1 application Topical BID   sodium chloride flush  10-40 mL Intracatheter Q12H   Continuous Infusions:  dextrose 5 % and 0.45 % NaCl with KCl 40 mEq/L 100 mL/hr at 11/19/20 0309   methocarbamol (ROBAXIN) IV 1,000 mg (11/17/20 1313)   ondansetron (ZOFRAN) IV       LOS: 5 days   Time spent: 35 minutes  Little Ishikawa, DO [facility]   If 7PM-7AM, please contact night-coverage [email or web address]  11/19/2020, 7:29 AM

## 2020-11-19 NOTE — Progress Notes (Signed)
Initial Nutrition Assessment  INTERVENTION:   Monitor magnesium, potassium, and phosphorus daily for at least 3 days, MD to replete as needed, as pt is at risk for refeeding syndrome.  -TPN management per Pharmacy (planned to start 9/10)  -Will monitor for diet advancement -Recommend Glucerna Shake po TID, each supplement provides 220 kcal and 10 grams of protein at that time  NUTRITION DIAGNOSIS:   Increased nutrient needs related to cancer and cancer related treatments as evidenced by estimated needs.  GOAL:   Patient will meet greater than or equal to 90% of their needs  MONITOR:   Labs, Weight trends, I & O's (TPN)  REASON FOR ASSESSMENT:   Consult New TPN/TNA  ASSESSMENT:   60 year old male with a history of metastatic pancreatic cancer, admitted to the hospital with intractable nausea and vomiting.  He was found to have small bowel obstruction and was admitted for further management.  He had NG tube placed for decompression.  9/4: admitted, COVID-19+  Spoke with pt's wife, as pt finds it difficult to speak with NGT in place. Pt was eating well with good appetite, 3 meals a day with snacks in between, focusing on protein foods with every meal. Per pt's wife, pt's last meal was eggs on 9/3. Pt began to have N/V starting that night. Pt has since had nothing to eat (5 days).  Pt denies issues with taste, chewing but states he does have difficulty swallowing drier foods.  Pt has history of chronic diarrhea since having his gallbladder removed. Pt's wife states that sometimes he will drink Glucerna or Boost Glucose control for a supplement.  Plan is to start TPN tomorrow. Would monitor for refeeding syndrome.  Per GI note, plan is for stent placement 9/13 vs. venting G-tube if unsuccessful.  UBW is 148-152 lbs. Pt's wife states pt has had 15 lbs of weight loss.   Medications: D5 infusion  Labs reviewed.  NUTRITION - FOCUSED PHYSICAL EXAM:  Unable to  complete  Diet Order:   Diet Order             Diet NPO time specified Except for: Ice Chips, Other (See Comments)  Diet effective now                   EDUCATION NEEDS:   Education needs have been addressed  Skin:  Skin Assessment: Reviewed RN Assessment  Last BM:  9/7  Height:   Ht Readings from Last 1 Encounters:  11/14/20 '5\' 8"'$  (1.727 m)    Weight:   Wt Readings from Last 1 Encounters:  11/14/20 57.7 kg    BMI:  Body mass index is 19.34 kg/m.  Estimated Nutritional Needs:   Kcal:  1800-2000  Protein:  90-105g  Fluid:  2L/day  Clayton Bibles, MS, RD, LDN Inpatient Clinical Dietitian Contact information available via Amion

## 2020-11-19 NOTE — Progress Notes (Signed)
PHARMACY - TOTAL PARENTERAL NUTRITION CONSULT NOTE   Indication: Small bowel obstruction   Assessment: New TPN consult noted, received consult after NOON cutoff so TPN will be started 9/10 at 1800. Will order baseline labs for AM and dietary consult for any updated goals, etc  Kara Mead 11/19/2020,2:24 PM

## 2020-11-19 NOTE — Consult Note (Signed)
Referring Provider: [facility] Primary Care Physician:  Gregor Hams, FNP Primary Gastroenterologist: Althia Forts  Reason for Consultation:  Small bowel obstruction   HPI: Frank Garrett is a 60 y.o. male with history of metastatic pancreatic cancer, history of PE (on Xarelto as an outpatient), incidental COVID-19 infection, presenting for consultation of small bowel obstruction.  Patient states he was in his usual state of health until Saturday 9/3, at which point he started having intractable nausea and vomiting.  Zofran did not provide relief, so he presented to the ED 9/4.  Denies any hematemesis.  Reports abdominal pain prior to NGT placement, though not currently having pain.  Reports continued loose BMs.  No melena or hematochezia.  Reports continued weight loss, 10-20 lbs in past 1 month.  Takes Xarelto at home, was transitioned to Lovenox during admission.  No family history of gastrointestinal malignancy.  CT abdomen/pelvis 9/4:  -High-grade bowel obstruction with marked dilation of the stomach and duodenum, abrupt transition point at the duodenal-jejunal junction. This is either due to adhesions or metastatic disease. -Multiple hypodense liver lesions, new from prior exam in August 2018, consistent with metastatic disease.  -Focal colonic wall thickening with adjacent stranding at the hepatic flexure, consistent with mild focal colitis.  UGI series 9/8: Resolution of gastric obstruction, in the setting of nasogastric tube placement. Residual duodenal dilatation with lack of contrast passage into the jejunum. Findings again favor high-grade obstruction at the duodenal/jejunal junction.   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   SPLENECTOMY, TOTAL  10/19/2016    Prior to Admission medications   Medication Sig Start Date End Date Taking? Authorizing Provider  ASTRAGALUS PO Take 600 mg by mouth 2 (two) times daily. 1 tablet - 300 mg   Yes [provider]  capecitabine (XELODA) 500 MG tablet Take 500 mg by mouth See admin instructions. Take 3 tablets (1500 mg) by mouth three times daily for one week, hold for one week and then resume. 11/08/20  Yes [provider]  Cholecalciferol (VITAMIN D3) 5000 units CAPS Take 5,000 Units by mouth in the morning.   Yes [provider]  Cyanocobalamin (VITAMIN B-12) 1000 MCG/15ML LIQD Take 1,000 mcg by mouth every morning.   Yes [provider]  Digestive Enzymes (PANPLEX 2-PHASE PO) Take 1 capsule by mouth 3 (three) times daily with meals.   Yes [provider]  diphenoxylate-atropine (LOMOTIL) 2.5-0.025 MG tablet Take 2 tablets by mouth 4 (four) times daily as needed for diarrhea or loose stools. 10/06/20  Yes [provider]  gabapentin (NEURONTIN) 300 MG capsule Take 600 mg by mouth at bedtime.   Yes [provider]  Melatonin 10 MG CAPS Take 10 mg by mouth at bedtime.   Yes [provider]  omeprazole (PRILOSEC) 40 MG capsule Take 40 mg by mouth 2 (  two) times daily.   Yes [provider]  ondansetron (ZOFRAN) 8 MG tablet Take 8 mg by mouth every 8 (eight) hours as needed for nausea or vomiting.   Yes [provider]  OVER THE COUNTER MEDICATION Take 1 drop by mouth every morning. [place] Gold 1200 liquid (1 drop=42.'9mg'$ )   Yes [provider]  OVER THE COUNTER MEDICATION Take 300 mg by mouth every morning. Theracurmin   Yes [provider]  OVER THE COUNTER MEDICATION Take 1 tablet by mouth every morning. FerraSorb   Yes [provider]  Pancrelipase, Lip-Prot-Amyl, (PANCREAZE) 21000-54700 units CPEP Take 1 capsule by mouth 3 (three) times daily.   Yes [provider]  rivaroxaban (XARELTO) 20 MG TABS tablet Take 20 mg by mouth daily with supper.   Yes [provider]  traMADol (ULTRAM) 50 MG tablet Take 50 mg by mouth daily as needed (pain). 05/24/16  Yes [provider]  VITAMIN E PO Take 1 capsule by mouth every morning.   Yes [provider]    Scheduled Meds:  bisacodyl  10 mg Rectal Daily   Chlorhexidine Gluconate Cloth  6 each Topical Daily   enoxaparin (LOVENOX) injection  60 mg Subcutaneous Q12H   lip balm  1 application Topical BID   sodium chloride flush  10-40 mL Intracatheter Q12H   Continuous Infusions:  dextrose 5 % and 0.45 % NaCl with KCl 40 mEq/L 100 mL/hr at 11/19/20 0309   methocarbamol (ROBAXIN) IV 1,000 mg (11/17/20 1313)   ondansetron (ZOFRAN) IV     PRN Meds:.acetaminophen, alum & mag hydroxide-simeth, diphenhydrAMINE, HYDROmorphone (DILAUDID) injection, LORazepam, magic mouthwash, menthol-cetylpyridinium, methocarbamol (ROBAXIN) IV, metoprolol tartrate, ondansetron (ZOFRAN) IV **OR** ondansetron (ZOFRAN) IV, phenol, prochlorperazine, simethicone  Allergies as of 11/14/2020 - Review Complete 11/14/2020  Allergen Reaction Noted   Oxycodone Other (See Comments) 11/09/2016    History reviewed. No pertinent family history.  Social History   Socioeconomic History   Marital status: Married    Spouse name: Not on file   Number of children: Not on file   Years of education: Not on file   Highest education level:  Not on file  Occupational History   Not on file  Tobacco Use   Smoking status: Former    Packs/day: 1.00    Years: 33.00    Pack years: 33.00    Types: Cigarettes    Quit date: 03/13/2010    Years since quitting: 10.6   Smokeless tobacco: Never  Vaping Use   Vaping Use: Never used  Substance and Sexual Activity   Alcohol use: No    Comment: Former drinker (stopped in 2012)   Drug use: No   Sexual activity: Yes  Other Topics Concern   Not on file  Social History Narrative   Not on file   Social Determinants of Health   Financial Resource Strain: Not on file  Food Insecurity: Not on file  Transportation Needs: Not on file  Physical Activity: Not on file  Stress: Not on file  Social Connections: Not on file  Intimate Partner Violence: Not on file    Review of Systems: Review of Systems  Constitutional:  Positive for malaise/fatigue and weight loss.  HENT:  Negative for hearing loss and tinnitus.   Eyes:  Negative for pain and redness.  Respiratory:  Negative for cough and shortness of breath.   Cardiovascular:  Negative for chest pain and palpitations.  Gastrointestinal:  Positive for diarrhea, nausea and vomiting. Negative  for abdominal pain, blood in stool, constipation, heartburn and melena.  Genitourinary:  Negative for flank pain and hematuria.  Musculoskeletal:  Negative for falls and joint pain.  Skin:  Negative for itching and rash.  Neurological:  Negative for seizures and loss of consciousness.  Psychiatric/Behavioral:  Negative for substance abuse. The patient is not nervous/anxious.     Physical Exam: Vital signs: Vitals:   11/18/20 2027 11/19/20 0408  BP: 107/74 103/73  Pulse: 71 72  Resp: 18 18  Temp: 99.5 F (37.5 C) 98.7 F (37.1 C)  SpO2: 100% 99%   Last BM Date: 11/17/20  Physical Exam Vitals reviewed.  Constitutional:      General: He is not in acute distress.    Appearance: He is cachectic. He is ill-appearing (chronically).      Comments: NGT in place Wife at bedside  HENT:     Head: Normocephalic and atraumatic.     Nose: Nose normal. No congestion.     Mouth/Throat:     Mouth: Mucous membranes are moist.     Pharynx: Oropharynx is clear.  Eyes:     General: No scleral icterus.    Extraocular Movements: Extraocular movements intact.  Cardiovascular:     Rate and Rhythm: Normal rate and regular rhythm.  Pulmonary:     Effort: Pulmonary effort is normal. No respiratory distress.  Abdominal:     General: Abdomen is flat. Bowel sounds are normal. There is no distension.     Palpations: Abdomen is soft.     Tenderness: There is no abdominal tenderness. There is no guarding or rebound.  Musculoskeletal:        General: No swelling or tenderness.     Cervical back: Normal range of motion and neck supple.  Skin:    General: Skin is warm and dry.  Neurological:     General: No focal deficit present.     Mental Status: He is oriented to person, place, and time. He is lethargic.  Psychiatric:        Mood and Affect: Mood normal.        Behavior: Behavior normal. Behavior is cooperative.   GI:  Lab Results: Recent Labs    11/17/20 0408 11/18/20 0454 11/19/20 0430  WBC 2.9* 3.1* 3.8*  HGB 9.7* 9.2* 8.8*  HCT 31.2* 30.2* 28.9*  PLT 269 298 357   BMET Recent Labs    11/17/20 0408 11/18/20 0454 11/19/20 0430  NA 145 145 138  K 3.8 4.2 4.5  CL 108 114* 105  CO2 '29 25 27  '$ GLUCOSE 137* 134* 131*  BUN 36* 23* 16  CREATININE 1.46* 1.14 1.02  CALCIUM 10.0 9.2 9.0   LFT Recent Labs    11/19/20 0430  PROT 7.1  ALBUMIN 3.1*  AST 20  ALT 44  ALKPHOS 179*  BILITOT 0.7   PT/INR No results for input(s): LABPROT, INR in the last 72 hours.   Studies/Results: DG Abd 2 Views  Result Date: 11/18/2020 CLINICAL DATA:  Small bowel obstruction. EXAM: ABDOMEN - 2 VIEW COMPARISON:  11/16/2020 FINDINGS: NG tube is looped in the stomach. Lung bases are unremarkable. No gaseous small bowel dilatation on the  current study. Contrast material seen in the colon previously has passed in the interval. Visualized bony anatomy unremarkable. IMPRESSION: No evidence for gaseous small bowel dilatation on the current study. Electronically Signed   By: Misty Stanley M.D.   On: 11/18/2020 11:51   DG UGI W SINGLE CM (SOL OR  THIN BA)  Result Date: 11/18/2020 CLINICAL DATA:  CT demonstrating small-bowel obstruction at the duodenal/jejunal junction. History of Whipple procedure for pancreatic cancer in 2018 EXAM: UPPER GI SERIES WITH KUB TECHNIQUE: After obtaining a scout radiograph a focused upper GI series was performed using thin barium FLUOROSCOPY TIME:  Fluoroscopy Time:  5 minutes and 5 seconds Radiation Exposure Index (if provided by the fluoroscopic device): Number of Acquired Spot Images: 0 COMPARISON:  CT of 11/14/2020 FINDINGS: Preprocedure scout film demonstrates a nasogastric tube in the proximal stomach. No residual gastric distension. Administration of 180 cc of dilute barium with placement in supine and right-side-down decubitus imaging demonstrates normal caliber of the stomach. The duodenal C loop is dilated, on the order of 3.3 cm. Despite contrast administration and repositioning, contrast does not fill the proximal jejunum. IMPRESSION: Resolution of gastric obstruction, in the setting of nasogastric tube placement. Residual duodenal dilatation with lack of contrast passage into the jejunum. Findings again favor high-grade obstruction at the duodenal/jejunal junction. Consider plain film follow-up to evaluate for eventual opacification of the jejunum. Electronically Signed   By: Abigail Miyamoto M.D.   On: 11/18/2020 15:53    Impression: Small bowel obstruction, likely due to metastatic pancreatic cancer  History of PE on Xarelto as an outpatient, now on Lovenox while admitted  Incidental COVID-19 infection  Plan: Enteroscopy with stent placement with Dr. Watt Climes on Tuesday 9/13. I thoroughly discussed the  procedure with patient and patient's wife at bedside to include nature, alternatives, benefits, and risks (including but not limited to bleeding, infection, perforation, anesthesia/cardiac and pulmonary complications). Patient and patient's wife verbalized understanding and gave verbal consent to proceed with enteroscopy with stent placement.   Continue NGT.  Continue supportive care.  [facility] will follow.    LOS: 5 days   Salley Slaughter  PA-C 11/19/2020, 9:44 AM  Contact #  [phone]

## 2020-11-19 NOTE — Plan of Care (Signed)
  Problem: Education: Goal: Knowledge of General Education information will improve Description: Including pain rating scale, medication(s)/side effects and non-pharmacologic comfort measures Outcome: Progressing   Problem: Health Behavior/Discharge Planning: Goal: Ability to manage health-related needs will improve Outcome: Progressing   Problem: Clinical Measurements: Goal: Will remain free from infection Outcome: Progressing   

## 2020-11-19 NOTE — Plan of Care (Signed)

## 2020-11-20 LAB — COMPREHENSIVE METABOLIC PANEL
ALT: 34 U/L (ref 0–44)
AST: 21 U/L (ref 15–41)
Albumin: 3.1 g/dL — ABNORMAL LOW (ref 3.5–5.0)
Alkaline Phosphatase: 163 U/L — ABNORMAL HIGH (ref 38–126)
Anion gap: 10 (ref 5–15)
BUN: 14 mg/dL (ref 6–20)
CO2: 26 mmol/L (ref 22–32)
Calcium: 9.3 mg/dL (ref 8.9–10.3)
Chloride: 103 mmol/L (ref 98–111)
Creatinine, Ser: 1.07 mg/dL (ref 0.61–1.24)
GFR, Estimated: >60 mL/min (ref >60)
Glucose, Bld: 126 mg/dL — ABNORMAL HIGH (ref 70–99)
Potassium: 4.6 mmol/L (ref 3.5–5.1)
Sodium: 139 mmol/L (ref 135–145)
Total Bilirubin: 0.7 mg/dL (ref 0.3–1.2)
Total Protein: 7.1 g/dL (ref 6.5–8.1)

## 2020-11-20 LAB — TRIGLYCERIDES: Triglycerides: 85 mg/dL (ref <150)

## 2020-11-20 LAB — GLUCOSE, CAPILLARY: Glucose-Capillary: 73 mg/dL (ref 70–99)

## 2020-11-20 LAB — MAGNESIUM: Magnesium: 2.2 mg/dL (ref 1.7–2.4)

## 2020-11-20 LAB — PHOSPHORUS: Phosphorus: 2.6 mg/dL (ref 2.5–4.6)

## 2020-11-20 MED ORDER — KCL IN DEXTROSE-NACL 40-5-0.45 MEQ/L-%-% IV SOLN
INTRAVENOUS | Status: DC
  Filled 2020-11-20 (×2): qty 1000

## 2020-11-20 MED ORDER — TRAVASOL 10 % IV SOLN
INTRAVENOUS | Status: AC
  Filled 2020-11-20: qty 480

## 2020-11-20 MED ORDER — INSULIN ASPART 100 UNIT/ML IJ SOLN
0.0000 [IU] | Freq: Three times a day (TID) | INTRAMUSCULAR | Status: DC
  Administered 2020-11-21 – 2020-11-23 (×6): 1 [IU] via SUBCUTANEOUS
  Administered 2020-11-23 – 2020-11-24 (×3): 2 [IU] via SUBCUTANEOUS
  Administered 2020-11-24 – 2020-11-25 (×5): 1 [IU] via SUBCUTANEOUS

## 2020-11-20 NOTE — Progress Notes (Signed)
PROGRESS NOTE    Frank Garrett  [id number] DOB: [birth year] DOA: 11/14/2020 PCP: Gregor Hams, FNP    Brief Narrative:  60 year old male with a history of metastatic pancreatic cancer, admitted to the hospital with intractable nausea and vomiting.  He was found to have small bowel obstruction and was admitted for further management.  He had NG tube placed for decompression.  General surgery consulted.  Assessment & Plan:  Small bowel obstruction, ongoing -Currently has NG tube for decompression - was restarted 11/17/20 after worsening abdominal pain/distention -General surgery following -appreciate insight and recommendations -Continues to have occasional small bowel movements and multiple episodes of flatus -Repeat imaging shows a more proximal duodenal/jejunal obstruction, surgery requesting GI evaluation for possible stenting given location, tentative plan for small bowel stent 11/23/2020 pending pre-procedure clearance and clinical status -Ongoing concern that this is due to metastatic disease, we will continue conservative treatment as above in the meantime -TPN to start 11/20/2020 given prolonged SBO and inability to take p.o. adequately  Metastatic pancreatic cancer -Patient receives his care with [facility] [place] in [place] -awaiting callback from this group to further discuss possible need for in-house consult here versus outpatient follow-up -Palliative care consulted, but patient did not wish to discuss goals of care at this time and focus on treatment including aggressive measures  Hypernatremia, resolved -likely related to high output from NG tube -Continue IV fluids, initiate TPN per pharmacy consult to start 11/20/2020  AKI, resolving -TPN to initiate 11/20/2020  Incidentally COVID-positive, asymptomatic, unclear timeframe -No respiratory symptoms or fevers -Inflammatory markers flat, minimally elevated -Treat supportively at this time  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 -Continue therapeutic Lovenox -D-dimer elevated in the setting of COVID, no indication for imaging given treatment continues as above with full dose anticoagulation  Right shoulder pain -In the setting of musculoskeletal strain/sprain, worse with range of motion -Improved with robaxin  Goals of care -appreciate palliative care input -currently full code   DVT prophylaxis:   Therapeutic Lovenox  Code Status: Full code Family Communication: Discussed with wife at the bedside Disposition Plan: Status is: Inpatient  Remains inpatient appropriate because:Ongoing diagnostic testing needed not appropriate for outpatient work up, IV treatments appropriate due to intensity of illness or inability to take PO, and Inpatient level of care appropriate due to severity of illness  Dispo: The patient is from: Home              Anticipated d/c is to: Home              Patient currently is not medically stable to d/c.   Difficult to place patient No  Consultants:  General surgery Palliative care GI  Subjective: No acute issues or events overnight, denies headache fever chills nausea vomiting diarrhea, multiple episodes of flatus with single episode of loose bowel movement yesterday  Objective: Vitals:   11/19/20 0408 11/19/20 1140 11/19/20 2026 11/20/20 0506  BP: 103/73 103/78 105/84 105/75  Pulse: 72 70 66 64  Resp: '18 14  16  '$ Temp: 98.7 F (37.1 C) 98.8 F (37.1 C) 98.4 F (36.9 C) 98.1 F (36.7 C)  TempSrc: Oral Oral Oral Oral  SpO2: 99% 98% 100% 99%  Weight:      Height:        Intake/Output Summary (Last 24 hours) at 11/20/2020 0807 Last data filed at 11/20/2020 0425 Gross per 24 hour  Intake 2166.35 ml  Output 2540 ml  Net -  373.65 ml    Filed Weights   11/14/20 1839  Weight: 57.7 kg    Examination:  General exam: Alert, awake, oriented x 3 Respiratory system: Clear to auscultation. Respiratory effort  normal. Cardiovascular system:RRR. No murmurs, rubs, gallops. Gastrointestinal system: Abdomen is nondistended, soft and nontender. No organomegaly or masses felt. Normal bowel sounds heard. NG tube in place to suction Central nervous system: Alert and oriented. No focal neurological deficits. Extremities: No C/C/E, +pedal pulses Skin: No rashes, lesions or ulcers Psychiatry: Judgement and insight appear normal. Mood & affect appropriate.    Data Reviewed: I have personally reviewed following labs and imaging studies  CBC: Recent Labs  Lab 11/15/20 0325 11/16/20 0318 11/17/20 0408 11/18/20 0454 11/19/20 0430  WBC 2.5* 2.5* 2.9* 3.1* 3.8*  NEUTROABS 0.7* 0.6* 0.7* 0.8* 1.4*  HGB 8.0* 9.5* 9.7* 9.2* 8.8*  HCT 25.2* 29.8* 31.2* 30.2* 28.9*  MCV 92.6 92.3 93.7 95.6 95.7  PLT 191 240 269 298 [id number]    Basic Metabolic Panel: Recent Labs  Lab 11/16/20 0318 11/17/20 0408 11/18/20 0454 11/19/20 0430 11/20/20 0607  NA 148* 145 145 138 139  K 3.6 3.8 4.2 4.5 4.6  CL 112* 108 114* 105 103  CO2 '25 29 25 27 26  '$ GLUCOSE 105* 137* 134* 131* 126*  BUN 28* 36* 23* 16 14  CREATININE 1.25* 1.46* 1.14 1.02 1.07  CALCIUM 10.1 10.0 9.2 9.0 9.3  MG  --  2.8*  --   --  2.2  PHOS  --   --   --   --  2.6    GFR: Estimated Creatinine Clearance: 61.4 mL/min (by C-G formula based on SCr of 1.07 mg/dL). Liver Function Tests: Recent Labs  Lab 11/16/20 0318 11/17/20 0408 11/18/20 0454 11/19/20 0430 11/20/20 0607  AST 49* 41 '26 20 21  '$ ALT 87* 85* 59* 44 34  ALKPHOS 288* 256* 202* 179* 163*  BILITOT 1.5* 1.1 1.0 0.7 0.7  PROT 8.6* 8.5* 7.5 7.1 7.1  ALBUMIN 3.7 3.7 3.2* 3.1* 3.1*    Recent Labs  Lab 11/14/20 1152  LIPASE 207*    No results for input(s): AMMONIA in the last 168 hours. Coagulation Profile: No results for input(s): INR, PROTIME in the last 168 hours. Cardiac Enzymes: No results for input(s): CKTOTAL, CKMB, CKMBINDEX, TROPONINI in the last 168 hours. BNP (last 3  results) No results for input(s): PROBNP in the last 8760 hours. HbA1C: No results for input(s): HGBA1C in the last 72 hours. CBG: No results for input(s): GLUCAP in the last 168 hours. Lipid Profile: Recent Labs    11/20/20 0607  TRIG 85   Thyroid Function Tests: No results for input(s): TSH, T4TOTAL, FREET4, T3FREE, THYROIDAB in the last 72 hours. Anemia Panel: Recent Labs    11/18/20 0454 11/19/20 0430  FERRITIN 683* 663*    Sepsis Labs: Recent Labs  Lab 11/14/20 1152  LATICACIDVEN 2.3*     Recent Results (from the past 240 hour(s))  Resp Panel by RT-PCR (Flu A&B, Covid) Nasopharyngeal Swab     Status: Abnormal   Collection Time: 11/14/20 12:58 PM   Specimen: Nasopharyngeal Swab; Nasopharyngeal(NP) swabs in vial transport medium  Result Value Ref Range Status   SARS Coronavirus 2 by RT PCR POSITIVE (A) NEGATIVE Final    Comment: RESULT CALLED TO, READ BACK BY AND VERIFIED WITH: Lenn Cal, RN [phone] KDS (NOTE) SARS-CoV-2 target nucleic acids are DETECTED.  The SARS-CoV-2 RNA is generally detectable in upper respiratory specimens during the  acute phase of infection. Positive results are indicative of the presence of the identified virus, but do not rule out bacterial infection or co-infection with other pathogens not detected by the test. Clinical correlation with patient history and other diagnostic information is necessary to determine patient infection status. The expected result is Negative.  Fact Sheet for Patients: EntrepreneurPulse.com.au  Fact Sheet for Healthcare Providers: IncredibleEmployment.be  This test is not yet approved or cleared by the [place] FDA and  has been authorized for detection and/or diagnosis of SARS-CoV-2 by FDA under an Emergency Use Authorization (EUA).  This EUA will remain in effect (meaning this test can be used)  for the duration of  the COVID-19 declaration under Section  564(b)(1) of the Act, 21 U.S.C. section 360bbb-3(b)(1), unless the authorization is terminated or revoked sooner.     Influenza A by PCR NEGATIVE NEGATIVE Final   Influenza B by PCR NEGATIVE NEGATIVE Final    Comment: (NOTE) The Xpert Xpress SARS-CoV-2/FLU/RSV plus assay is intended as an aid in the diagnosis of influenza from Nasopharyngeal swab specimens and should not be used as a sole basis for treatment. Nasal washings and aspirates are unacceptable for Xpert Xpress SARS-CoV-2/FLU/RSV testing.  Fact Sheet for Patients: EntrepreneurPulse.com.au  Fact Sheet for Healthcare Providers: IncredibleEmployment.be  This test is not yet approved or cleared by the [place] FDA and has been authorized for detection and/or diagnosis of SARS-CoV-2 by FDA under an Emergency Use Authorization (EUA). This EUA will remain in effect (meaning this test can be used) for the duration of the COVID-19 declaration under Section 564(b)(1) of the Act, 21 U.S.C. section 360bbb-3(b)(1), unless the authorization is terminated or revoked.  Performed at [facility], [place] [address], [place], [place] [address]      Radiology Studies: DG Abd 2 Views  Result Date: 11/18/2020 CLINICAL DATA:  Small bowel obstruction. EXAM: ABDOMEN - 2 VIEW COMPARISON:  11/16/2020 FINDINGS: NG tube is looped in the stomach. Lung bases are unremarkable. No gaseous small bowel dilatation on the current study. Contrast material seen in the colon previously has passed in the interval. Visualized bony anatomy unremarkable. IMPRESSION: No evidence for gaseous small bowel dilatation on the current study. Electronically Signed   By: Misty Stanley M.D.   On: 11/18/2020 11:51   DG Abd Portable 2V  Result Date: 11/19/2020 CLINICAL DATA:  Continued nausea, abdominal pain, distention, NG tube in place EXAM: PORTABLE ABDOMEN - 2 VIEW COMPARISON:  Radiograph 11/18/2020 FINDINGS: There  is a nasogastric tube with tip and side port overlying the stomach. Partially visualized central venous catheter with tip overlying the superior cavoatrial junction. There is contrast material throughout the colon. No visible small bowel dilation on the current study. IMPRESSION: Contrast material has passed into the colon, without visible stomach or duodenal dilation on the current exam. Electronically Signed   By: Maurine Simmering M.D.   On: 11/19/2020 12:59   DG UGI W SINGLE CM (SOL OR THIN BA)  Result Date: 11/18/2020 CLINICAL DATA:  CT demonstrating small-bowel obstruction at the duodenal/jejunal junction. History of Whipple procedure for pancreatic cancer in 2018 EXAM: UPPER GI SERIES WITH KUB TECHNIQUE: After obtaining a scout radiograph a focused upper GI series was performed using thin barium FLUOROSCOPY TIME:  Fluoroscopy Time:  5 minutes and 5 seconds Radiation Exposure Index (if provided by the fluoroscopic device): Number of Acquired Spot Images: 0 COMPARISON:  CT of 11/14/2020 FINDINGS: Preprocedure scout film demonstrates a nasogastric tube in the proximal  stomach. No residual gastric distension. Administration of 180 cc of dilute barium with placement in supine and right-side-down decubitus imaging demonstrates normal caliber of the stomach. The duodenal C loop is dilated, on the order of 3.3 cm. Despite contrast administration and repositioning, contrast does not fill the proximal jejunum. IMPRESSION: Resolution of gastric obstruction, in the setting of nasogastric tube placement. Residual duodenal dilatation with lack of contrast passage into the jejunum. Findings again favor high-grade obstruction at the duodenal/jejunal junction. Consider plain film follow-up to evaluate for eventual opacification of the jejunum. Electronically Signed   By: Abigail Miyamoto M.D.   On: 11/18/2020 15:53    Scheduled Meds:  bisacodyl  10 mg Rectal Daily   Chlorhexidine Gluconate Cloth  6 each Topical Daily    enoxaparin (LOVENOX) injection  60 mg Subcutaneous Q12H   lip balm  1 application Topical BID   sodium chloride flush  10-40 mL Intracatheter Q12H   Continuous Infusions:  dextrose 5 % and 0.45 % NaCl with KCl 40 mEq/L 100 mL/hr at 11/20/20 0425   methocarbamol (ROBAXIN) IV 1,000 mg (11/17/20 1313)   ondansetron (ZOFRAN) IV       LOS: 6 days   Time spent: 35 minutes  Little Ishikawa, DO [facility]   If 7PM-7AM, please contact night-coverage [email or web address]  11/20/2020, 8:07 AM

## 2020-11-20 NOTE — Progress Notes (Signed)
PHARMACY - TOTAL PARENTERAL NUTRITION CONSULT NOTE   Indication: Small bowel obstruction  Patient Measurements: Height: '5\' 8"'$  (172.7 cm) Weight: 57.7 kg (127 lb 3.3 oz) IBW/kg (Calculated) : 68.4 TPN AdjBW (KG): 57.7 Body mass index is 19.34 kg/m.  Assessment: 60 yo male with metastatic pancreatic cancer followed by [facility] in [place] now with continued SBO due to tumor to start TPN.   Glucose / Insulin: am glucoses have been at goal < 150 Electrolytes: All WNL Renal: SCr 1.07 Hepatic: WNL TG: 85  Intake / Output; MIVF: I/O n/a yet, currently on D51/2NS with 51mq KCL at 100 ml/hr GI Imaging: GI Surgeries / Procedures: plan for 9/13: per GI, possible duodenal/jejunal stent placement  Central access: patient already with Port TPN start date: 9/10  Nutritional Goals: Goal TPN rate is 80 mL/hr (provides 96 g of protein and 1939 kcals per day)  RD Assessment: Estimated Needs Total Energy Estimated Needs: 1800-2000 Total Protein Estimated Needs: 90-105g Total Fluid Estimated Needs: 2L/day  Current Nutrition:  NPO  Plan:  Start TPN at 40 mL/hr at 1800 Electrolytes in TPN: Na 555m/L, K 504mL, Ca 5mE77m, Mg 5mEq35m and Phos 15mmo52m Cl:Ac 1:1 Add standard MVI and trace elements to TPN Initiate Sensitive q8h SSI and adjust as needed  Reduce MIVF to 60 mL/hr at 1800 Monitor TPN labs on Mon/Thurs, and as needed   JustinAdrian SaranmD, BCPS Secure Chat if ?s 11/20/2020 10:15 AM

## 2020-11-20 NOTE — Plan of Care (Signed)

## 2020-11-21 ENCOUNTER — Encounter (HOSPITAL_COMMUNITY): Payer: Self-pay | Admitting: Internal Medicine

## 2020-11-21 LAB — MAGNESIUM: Magnesium: 2.1 mg/dL (ref 1.7–2.4)

## 2020-11-21 LAB — COMPREHENSIVE METABOLIC PANEL
ALT: 29 U/L (ref 0–44)
AST: 21 U/L (ref 15–41)
Albumin: 3.2 g/dL — ABNORMAL LOW (ref 3.5–5.0)
Alkaline Phosphatase: 153 U/L — ABNORMAL HIGH (ref 38–126)
Anion gap: 11 (ref 5–15)
BUN: 15 mg/dL (ref 6–20)
CO2: 26 mmol/L (ref 22–32)
Calcium: 9.3 mg/dL (ref 8.9–10.3)
Chloride: 101 mmol/L (ref 98–111)
Creatinine, Ser: 1.13 mg/dL (ref 0.61–1.24)
GFR, Estimated: >60 mL/min (ref >60)
Glucose, Bld: 119 mg/dL — ABNORMAL HIGH (ref 70–99)
Potassium: 4.8 mmol/L (ref 3.5–5.1)
Sodium: 138 mmol/L (ref 135–145)
Total Bilirubin: 0.5 mg/dL (ref 0.3–1.2)
Total Protein: 7.1 g/dL (ref 6.5–8.1)

## 2020-11-21 LAB — GLUCOSE, CAPILLARY
Glucose-Capillary: 123 mg/dL — ABNORMAL HIGH (ref 70–99)
Glucose-Capillary: 128 mg/dL — ABNORMAL HIGH (ref 70–99)
Glucose-Capillary: 129 mg/dL — ABNORMAL HIGH (ref 70–99)
Glucose-Capillary: 132 mg/dL — ABNORMAL HIGH (ref 70–99)

## 2020-11-21 LAB — PHOSPHORUS: Phosphorus: 3.9 mg/dL (ref 2.5–4.6)

## 2020-11-21 MED ORDER — DEXTROSE-NACL 5-0.45 % IV SOLN: INTRAVENOUS | Status: AC

## 2020-11-21 MED ORDER — TRAVASOL 10 % IV SOLN
INTRAVENOUS | Status: AC
  Filled 2020-11-21: qty 720

## 2020-11-21 MED ORDER — KCL IN DEXTROSE-NACL 40-5-0.45 MEQ/L-%-% IV SOLN: INTRAVENOUS | Status: DC

## 2020-11-21 NOTE — Progress Notes (Signed)
PHARMACY - TOTAL PARENTERAL NUTRITION CONSULT NOTE   Indication: Small bowel obstruction  Patient Measurements: Height: '5\' 8"'$  (172.7 cm) Weight: 57.7 kg (127 lb 3.3 oz) IBW/kg (Calculated) : 68.4 TPN AdjBW (KG): 57.7 Body mass index is 19.34 kg/m.  Assessment: 60 yo male with metastatic pancreatic cancer followed by [facility] [place] in [place] now with continued SBO due to tumor to start TPN.   Glucose / Insulin: CBGs since TPN started at goal < 150: 123, 129 Electrolytes: All WNL Renal: SCr 1.13, BUN 15 Hepatic: WNL stable TG: 85  Intake / Output; MIVF: I/O 1860/1200 (net +660 mL), currently on D51/2NS with 56mq KCL at 60 ml/hr GI Imaging: GI Surgeries / Procedures: plan for 9/13: per GI, possible duodenal/jejunal stent placement  Central access: patient already with Port TPN start date: 9/10  Nutritional Goals: Goal TPN rate is 80 mL/hr (provides 96 g of protein and 1939 kcals per day)  RD Assessment: Estimated Needs Total Energy Estimated Needs: 1800-2000 Total Protein Estimated Needs: 90-105g Total Fluid Estimated Needs: 2L/day  Current Nutrition:  NPO  Plan:  Increase TPN from 40 to 60 ml/hr at 1800 Electrolytes in TPN: Na 734m/L, K 5058mL, Ca 5mE60m, Mg 5mEq35m and Phos 15mmo22m Cl:Ac 1:1 (no changes) Add standard MVI and trace elements to TPN Continue Sensitive q8h SSI and adjust as needed  Reduce MIVF to 40 mL/hr now and will remove K from mIVFs Monitor TPN labs on Mon/Thurs, and as needed   JustinAdrian SaranmD, BCPS Secure Chat if ?s 11/21/2020 11:37 AM

## 2020-11-21 NOTE — Plan of Care (Signed)

## 2020-11-21 NOTE — Plan of Care (Signed)
  Problem: Education: Goal: Knowledge of General Education information will improve Description: Including pain rating scale, medication(s)/side effects and non-pharmacologic comfort measures 11/21/2020 1452 by Zadie Rhine, RN Outcome: Progressing 11/21/2020 1452 by Zadie Rhine, RN Outcome: Progressing   Problem: Health Behavior/Discharge Planning: Goal: Ability to manage health-related needs will improve 11/21/2020 1452 by Zadie Rhine, RN Outcome: Progressing 11/21/2020 1452 by Zadie Rhine, RN Outcome: Progressing   Problem: Clinical Measurements: Goal: Ability to maintain clinical measurements within normal limits will improve Outcome: Progressing Goal: Will remain free from infection Outcome: Progressing

## 2020-11-21 NOTE — Progress Notes (Signed)
PHARMACY - TOTAL PARENTERAL NUTRITION CONSULT NOTE   Indication: Small bowel obstruction  Patient Measurements: Height: '5\' 8"'$  (172.7 cm) Weight: 57.7 kg (127 lb 3.3 oz) IBW/kg (Calculated) : 68.4 TPN AdjBW (KG): 57.7 Body mass index is 19.34 kg/m.  Assessment: 60 yo male with metastatic pancreatic cancer followed by [facility] [place] in [place] now with continued SBO due to tumor to start TPN.   Glucose / Insulin: CBGs since TPN started at goal < 150: 123, 129 Electrolytes: All WNL Renal: SCr 1.13, BUN 15 Hepatic: WNL stable TG: 85  Intake / Output; MIVF: I/O 1860/1200 (net +660 mL), currently on D51/2NS with 92mq KCL at 60 ml/hr GI Imaging: GI Surgeries / Procedures: plan for 9/13: per GI, possible duodenal/jejunal stent placement  Central access: patient already with Port TPN start date: 9/10  Nutritional Goals: Goal TPN rate is 80 mL/hr (provides 96 g of protein and 1939 kcals per day)  RD Assessment: Estimated Needs Total Energy Estimated Needs: 1800-2000 Total Protein Estimated Needs: 90-105g Total Fluid Estimated Needs: 2L/day  Current Nutrition:  NPO  Plan:  Increase TPN from 40 to 60 ml/hr at 1800 Electrolytes in TPN: Na 780m/L, K 5020mL, Ca 5mE32m, Mg 5mEq77m and Phos 15mmo20m Cl:Ac 1:1 (no changes) Add standard MVI and trace elements to TPN Continue Sensitive q8h SSI and adjust as needed  Reduce MIVF to 40 mL/hr at 1800 Monitor TPN labs on Mon/Thurs, and as needed   JustinAdrian SaranmD, BCPS Secure Chat if ?s 11/21/2020 9:56 AM

## 2020-11-21 NOTE — Progress Notes (Signed)
Progress Note    Frank Garrett  [id number] DOB: 1961-03-05  DOA: 11/14/2020 PCP: Gregor Hams, FNP      Brief Narrative:    Medical records reviewed and are as summarized below:  Frank Garrett is a 60 y.o. male with medical history significant for metastatic pancreatic cancer, who presented to the hospital because of nausea and vomiting.  He was found to have a small bowel obstruction.  General surgeon was consulted to assist with management.  He was treated conservatively with NG tube decompression, analgesics, IV fluids and antiemetics.  He was started on TPN for nutrition.      Assessment/Plan:   Principal Problem:   SBO (small bowel obstruction) (HCC) Active Problems:   Nausea and vomiting   Anemia of chronic disease   Asplenia   Pancreas cancer (HCC)   Chronic anticoagulation   Liver metastasis from pancreas cancer   Neutropenia (HCC)   On antineoplastic chemotherapy   Nutrition Problem: Increased nutrient needs Etiology: cancer and cancer related treatments  Signs/Symptoms: estimated needs   Body mass index is 19.34 kg/m.   Small bowel obstruction, probably malignant SBO: Continue analgesics as needed for pain, antiemetics as needed for nausea/vomiting.  Continue IV fluids for hydration.  He is on TPN for nutrition.  He has been evaluated by the gastroenterologist as well.  Plan for enteroscopy with possible duodenal/jejunal stent placement on Tuesday, 11/23/2020.  Plan for follow-up with general surgeon for further recommendations.  Metastatic pancreatic cancer: He goes to the [facility] in [place] for oncology care.  Incidental COVID-19 infection: Asymptomatic.  No indication for treatment.  History of pulmonary embolism in August 2022: Xarelto on hold.  Continue therapeutic Lovenox.  Right shoulder pain likely from musculoskeletal strain: Improved  Hypernatremia, AKI: Resolved    Diet Order             Diet NPO  time specified Except for: Ice Chips, Other (See Comments)  Diet effective now                      Consultants: Electronics engineer Palliative care team  Procedures: None    Medications:    bisacodyl  10 mg Rectal Daily   Chlorhexidine Gluconate Cloth  6 each Topical Daily   enoxaparin (LOVENOX) injection  60 mg Subcutaneous Q12H   insulin aspart  0-9 Units Subcutaneous Q8H   lip balm  1 application Topical BID   sodium chloride flush  10-40 mL Intracatheter Q12H   Continuous Infusions:  dextrose 5 % and 0.45 % NaCl with KCl 40 mEq/L 60 mL/hr at 11/20/20 1902   methocarbamol (ROBAXIN) IV 1,000 mg (11/17/20 1313)   ondansetron (ZOFRAN) IV     TPN ADULT (ION) 40 mL/hr at 11/20/20 1853     Anti-infectives (From admission, onward)    None              Family Communication/Anticipated D/C date and plan/Code Status   DVT prophylaxis:      Code Status: Full Code  Family Communication: Plan discussed with his wife at the bedside Disposition Plan:    Status is: Inpatient  Remains inpatient appropriate because:Inpatient level of care appropriate due to severity of illness  Dispo: The patient is from: Home              Anticipated d/c is to: Home              Patient currently  is not medically stable to d/c.   Difficult to place patient No           Subjective:   Interval events noted.  He is having bowel movements.  No vomiting, diarrhea or abdominal pain.  His wife was at the bedside.  Objective:    Vitals:   11/20/20 0506 11/20/20 1357 11/20/20 2137 11/21/20 0610  BP: 105/75 101/67 106/73 100/74  Pulse: 64 60 (!) 55 67  Resp: '16  17 15  '$ Temp: 98.1 F (36.7 C) 97.6 F (36.4 C) 99 F (37.2 C) 98.9 F (37.2 C)  TempSrc: Oral Oral Oral Oral  SpO2: 99% 100% 100% 98%  Weight:      Height:       No data found.   Intake/Output Summary (Last 24 hours) at 11/21/2020 0959 Last data filed at 11/21/2020 0725 Gross  per 24 hour  Intake 1860.03 ml  Output 600 ml  Net 1260.03 ml   Filed Weights   11/14/20 1839  Weight: 57.7 kg    Exam:  GEN: NAD SKIN: No rash.  Port-A-Cath on left upper chest EYES: No pallor or icterus ENT: MMM.  NG tube connected to low wall suction with greenish fluid in the canister CV: RRR PULM: CTA B ABD: soft, ND, NT, +BS CNS: AAO x 3, non focal EXT: No edema or tenderness        Data Reviewed:   I have personally reviewed following labs and imaging studies:  Labs: Labs show the following:   Basic Metabolic Panel: Recent Labs  Lab 11/17/20 0408 11/18/20 0454 11/19/20 0430 11/20/20 0607 11/21/20 0441  NA 145 145 138 139 138  K 3.8 4.2 4.5 4.6 4.8  CL 108 114* 105 103 101  CO2 '29 25 27 26 26  '$ GLUCOSE 137* 134* 131* 126* 119*  BUN 36* 23* '16 14 15  '$ CREATININE 1.46* 1.14 1.02 1.07 1.13  CALCIUM 10.0 9.2 9.0 9.3 9.3  MG 2.8*  --   --  2.2 2.1  PHOS  --   --   --  2.6 3.9   GFR Estimated Creatinine Clearance: 58.2 mL/min (by C-G formula based on SCr of 1.13 mg/dL). Liver Function Tests: Recent Labs  Lab 11/17/20 0408 11/18/20 0454 11/19/20 0430 11/20/20 0607 11/21/20 0441  AST 41 '26 20 21 21  '$ ALT 85* 59* 44 34 29  ALKPHOS 256* 202* 179* 163* 153*  BILITOT 1.1 1.0 0.7 0.7 0.5  PROT 8.5* 7.5 7.1 7.1 7.1  ALBUMIN 3.7 3.2* 3.1* 3.1* 3.2*   Recent Labs  Lab 11/14/20 1152  LIPASE 207*   No results for input(s): AMMONIA in the last 168 hours. Coagulation profile No results for input(s): INR, PROTIME in the last 168 hours.  CBC: Recent Labs  Lab 11/15/20 0325 11/16/20 0318 11/17/20 0408 11/18/20 0454 11/19/20 0430  WBC 2.5* 2.5* 2.9* 3.1* 3.8*  NEUTROABS 0.7* 0.6* 0.7* 0.8* 1.4*  HGB 8.0* 9.5* 9.7* 9.2* 8.8*  HCT 25.2* 29.8* 31.2* 30.2* 28.9*  MCV 92.6 92.3 93.7 95.6 95.7  PLT 191 240 269 298 357   Cardiac Enzymes: No results for input(s): CKTOTAL, CKMB, CKMBINDEX, TROPONINI in the last 168 hours. BNP (last 3 results) No  results for input(s): PROBNP in the last 8760 hours. CBG: Recent Labs  Lab 11/20/20 1605 11/21/20 0013 11/21/20 0824  GLUCAP 73 123* 129*   D-Dimer: Recent Labs    11/19/20 0430  DDIMER 2.94*   Hgb A1c: No results for input(s): HGBA1C in  the last 72 hours. Lipid Profile: Recent Labs    11/20/20 0607  TRIG 85   Thyroid function studies: No results for input(s): TSH, T4TOTAL, T3FREE, THYROIDAB in the last 72 hours.  Invalid input(s): FREET3 Anemia work up: Recent Labs    11/19/20 0430  FERRITIN 663*   Sepsis Labs: Recent Labs  Lab 11/14/20 1152 11/15/20 0325 11/16/20 0318 11/17/20 0408 11/18/20 0454 11/19/20 0430  WBC  --    < > 2.5* 2.9* 3.1* 3.8*  LATICACIDVEN 2.3*  --   --   --   --   --    < > = values in this interval not displayed.    Microbiology Recent Results (from the past 240 hour(s))  Resp Panel by RT-PCR (Flu A&B, Covid) Nasopharyngeal Swab     Status: Abnormal   Collection Time: 11/14/20 12:58 PM   Specimen: Nasopharyngeal Swab; Nasopharyngeal(NP) swabs in vial transport medium  Result Value Ref Range Status   SARS Coronavirus 2 by RT PCR POSITIVE (A) NEGATIVE Final    Comment: RESULT CALLED TO, READ BACK BY AND VERIFIED WITH: Lenn Cal, RN [phone] KDS (NOTE) SARS-CoV-2 target nucleic acids are DETECTED.  The SARS-CoV-2 RNA is generally detectable in upper respiratory specimens during the acute phase of infection. Positive results are indicative of the presence of the identified virus, but do not rule out bacterial infection or co-infection with other pathogens not detected by the test. Clinical correlation with patient history and other diagnostic information is necessary to determine patient infection status. The expected result is Negative.  Fact Sheet for Patients: EntrepreneurPulse.com.au  Fact Sheet for Healthcare Providers: IncredibleEmployment.be  This test is not yet approved or cleared  by the [place] FDA and  has been authorized for detection and/or diagnosis of SARS-CoV-2 by FDA under an Emergency Use Authorization (EUA).  This EUA will remain in effect (meaning this test can be used)  for the duration of  the COVID-19 declaration under Section 564(b)(1) of the Act, 21 U.S.C. section 360bbb-3(b)(1), unless the authorization is terminated or revoked sooner.     Influenza A by PCR NEGATIVE NEGATIVE Final   Influenza B by PCR NEGATIVE NEGATIVE Final    Comment: (NOTE) The Xpert Xpress SARS-CoV-2/FLU/RSV plus assay is intended as an aid in the diagnosis of influenza from Nasopharyngeal swab specimens and should not be used as a sole basis for treatment. Nasal washings and aspirates are unacceptable for Xpert Xpress SARS-CoV-2/FLU/RSV testing.  Fact Sheet for Patients: EntrepreneurPulse.com.au  Fact Sheet for Healthcare Providers: IncredibleEmployment.be  This test is not yet approved or cleared by the [place] FDA and has been authorized for detection and/or diagnosis of SARS-CoV-2 by FDA under an Emergency Use Authorization (EUA). This EUA will remain in effect (meaning this test can be used) for the duration of the COVID-19 declaration under Section 564(b)(1) of the Act, 21 U.S.C. section 360bbb-3(b)(1), unless the authorization is terminated or revoked.  Performed at [facility], [place] [address] [place], [place] [address]     Procedures and diagnostic studies:  No results found.             LOS: 7 days   Earnstine Meinders  [facility]   Pager on [email or web address]. If 7PM-7AM, please contact night-coverage at [email or web address]     11/21/2020, 9:59 AM

## 2020-11-22 DIAGNOSIS — D638 Anemia in other chronic diseases classified elsewhere: Secondary | ICD-10-CM

## 2020-11-22 DIAGNOSIS — C787 Secondary malignant neoplasm of liver and intrahepatic bile duct: Secondary | ICD-10-CM

## 2020-11-22 DIAGNOSIS — R112 Nausea with vomiting, unspecified: Secondary | ICD-10-CM

## 2020-11-22 DIAGNOSIS — Z7901 Long term (current) use of anticoagulants: Secondary | ICD-10-CM

## 2020-11-22 DIAGNOSIS — K56609 Unspecified intestinal obstruction, unspecified as to partial versus complete obstruction: Secondary | ICD-10-CM

## 2020-11-22 DIAGNOSIS — C259 Malignant neoplasm of pancreas, unspecified: Secondary | ICD-10-CM

## 2020-11-22 DIAGNOSIS — L8990 Pressure ulcer of unspecified site, unspecified stage: Secondary | ICD-10-CM | POA: Insufficient documentation

## 2020-11-22 DIAGNOSIS — D709 Neutropenia, unspecified: Secondary | ICD-10-CM

## 2020-11-22 DIAGNOSIS — Z79899 Other long term (current) drug therapy: Secondary | ICD-10-CM

## 2020-11-22 DIAGNOSIS — Q8901 Asplenia (congenital): Secondary | ICD-10-CM

## 2020-11-22 LAB — COMPREHENSIVE METABOLIC PANEL
ALT: 24 U/L (ref 0–44)
AST: 18 U/L (ref 15–41)
Albumin: 3 g/dL — ABNORMAL LOW (ref 3.5–5.0)
Alkaline Phosphatase: 136 U/L — ABNORMAL HIGH (ref 38–126)
Anion gap: 9 (ref 5–15)
BUN: 18 mg/dL (ref 6–20)
CO2: 29 mmol/L (ref 22–32)
Calcium: 9.5 mg/dL (ref 8.9–10.3)
Chloride: 102 mmol/L (ref 98–111)
Creatinine, Ser: 0.99 mg/dL (ref 0.61–1.24)
GFR, Estimated: >60 mL/min (ref >60)
Glucose, Bld: 104 mg/dL — ABNORMAL HIGH (ref 70–99)
Potassium: 4.3 mmol/L (ref 3.5–5.1)
Sodium: 140 mmol/L (ref 135–145)
Total Bilirubin: 0.6 mg/dL (ref 0.3–1.2)
Total Protein: 6.7 g/dL (ref 6.5–8.1)

## 2020-11-22 LAB — GLUCOSE, CAPILLARY
Glucose-Capillary: 119 mg/dL — ABNORMAL HIGH (ref 70–99)
Glucose-Capillary: 155 mg/dL — ABNORMAL HIGH (ref 70–99)
Glucose-Capillary: 123 mg/dL — ABNORMAL HIGH (ref 70–99)

## 2020-11-22 LAB — MAGNESIUM: Magnesium: 2 mg/dL (ref 1.7–2.4)

## 2020-11-22 LAB — PHOSPHORUS: Phosphorus: 4.1 mg/dL (ref 2.5–4.6)

## 2020-11-22 LAB — TRIGLYCERIDES: Triglycerides: 68 mg/dL

## 2020-11-22 MED ORDER — DEXTROSE-NACL 5-0.45 % IV SOLN: INTRAVENOUS | Status: DC

## 2020-11-22 MED ORDER — TRAVASOL 10 % IV SOLN
INTRAVENOUS | Status: AC
  Filled 2020-11-22: qty 960

## 2020-11-22 NOTE — Progress Notes (Signed)
PROGRESS NOTE  Frank Garrett  [id number] DOB: [birth year] DOA: 11/14/2020 PCP: Tobie Lords D, FNP   Brief Narrative: Frank Garrett is a 60 y.o. male with a history of pancreatic CA s/p [place] 2018 and chemotherapy, breakthrough covid-19 infection Jan 2022 who presented to the ED 9/4 with nausea, vomiting due to small bowel obstruction which is felt to be due to adhesions from surgery vs. neoplastic invasion. Symptoms have improved dramatically with NG Tube, NPO status, and TPN has been started. GI is planning attempt at endoscopic placement of stent at duodenum-jejunum junction on 9/13.  Assessment & Plan: Principal Problem:   SBO (small bowel obstruction) (HCC) Active Problems:   Nausea and vomiting   Anemia of chronic disease   Asplenia   Pancreas cancer (HCC)   Chronic anticoagulation   Liver metastasis from pancreas cancer   Neutropenia (HCC)   On antineoplastic chemotherapy   Pressure injury of skin  Partial SBO:  - Continue TPN, NPO, NGT. Plan per surgery/GI is attempted placement at stent at point of stricture (duodenum-jejunum junction) on 9/13. Will hold anticoagulation per their recommendation.  - Alternative possible plans would include palliative venting gastrostomy   Positive covid-19 PCR: on 9/4. With cycle threshold of 33.6 and no attributable symptoms, this could be seen as a persistent (false) positive in an immunocompromised patient who had covid earlier this year.  - Based on prior hospitalist plans of care, isolation has been discontinued and the patient has remained asymptomatic from this perspective requiring no treatment.   History of PE (Aug 2022): No current hypoxia or evidence of RV overload.  - Hold anticoagulation for now w/procedural plans.   Metastatic pancreatic CA:  - Continue care through [facility]. Do not suspect oncology evaluation would be very helpful at this time, though if stricture is due to neoplasm and cannot be stented,  could consider consultation for prognostication.  AKI, hypernatremia: Resolved.  Neutropenia, anemia:  - Recheck in AM. Would likely cover with empiric abx if neutropenic around time of enteroscopy.   RN Pressure Injury Documentation: Pressure Injury 11/22/20 Sacrum Lower Stage 2 -  Partial thickness loss of dermis presenting as a shallow open injury with a red, pink wound bed without slough. Pink, no drainage. Surrounding skin in tact. (Active)  11/22/20 0829  Location: Sacrum  Location Orientation: Lower  Staging: Stage 2 -  Partial thickness loss of dermis presenting as a shallow open injury with a red, pink wound bed without slough.  Wound Description (Comments): Pink, no drainage. Surrounding skin in tact.  Present on Admission: Yes   DVT prophylaxis: Therapeutic lovenox (last dose this AM) Code Status: Full Family Communication: Wife at bedside Disposition Plan:  Status is: Inpatient  Remains inpatient appropriate because:IV treatments appropriate due to intensity of illness or inability to take PO  Dispo: The patient is from: Home              Anticipated d/c is to: Home              Patient currently is not medically stable to d/c.   Difficult to place patient No  Consultants:  General surgery GI Palliative care  Procedures:  TBD  Antimicrobials: None   Subjective: Nausea and vomiting have subsided with NGT which continues significant output. No dyspnea, cough, chest pain or fever.   Objective: Vitals:   11/21/20 1546 11/21/20 2145 11/22/20 0452 11/22/20 1502  BP: 101/73 103/66 100/66 101/64  Pulse: 63 (!) 55 61 (!) 56  Resp: '16 19 16 18  '$ Temp: 98.5 F (36.9 C) 98.7 F (37.1 C) 98.3 F (36.8 C) 98.3 F (36.8 C)  TempSrc: Oral Oral Oral Oral  SpO2: 98% 100% 99% 100%  Weight:      Height:        Intake/Output Summary (Last 24 hours) at 11/22/2020 1559 Last data filed at 11/22/2020 1400 Gross per 24 hour  Intake 2228.38 ml  Output 600 ml  Net  1628.38 ml   Filed Weights   11/14/20 1839  Weight: 57.7 kg   Gen: 60 y.o. male in no distress Pulm: Non-labored breathing room air. Clear to auscultation bilaterally.  CV: Regular rate and rhythm. No murmur, rub, or gallop. No JVD, no pitting pedal edema. GI: Abdomen soft, non-tender, non-distended, with normoactive bowel sounds. No organomegaly or masses felt. Ext: Warm, no deformities Skin: Midline surgical scar on abdomen. No rashes, lesions or ulcers elsewhere.  Neuro: Alert and oriented. No focal neurological deficits. Psych: Judgement and insight appear normal. Mood & affect appropriate.   Data Reviewed: I have personally reviewed following labs and imaging studies  CBC: Recent Labs  Lab 11/16/20 0318 11/17/20 0408 11/18/20 0454 11/19/20 0430  WBC 2.5* 2.9* 3.1* 3.8*  NEUTROABS 0.6* 0.7* 0.8* 1.4*  HGB 9.5* 9.7* 9.2* 8.8*  HCT 29.8* 31.2* 30.2* 28.9*  MCV 92.3 93.7 95.6 95.7  PLT 240 269 298 [id number]   Basic Metabolic Panel: Recent Labs  Lab 11/17/20 0408 11/18/20 0454 11/19/20 0430 11/20/20 0607 11/21/20 0441 11/22/20 0331  NA 145 145 138 139 138 140  K 3.8 4.2 4.5 4.6 4.8 4.3  CL 108 114* 105 103 101 102  CO2 '29 25 27 26 26 29  '$ GLUCOSE 137* 134* 131* 126* 119* 104*  BUN 36* 23* '16 14 15 18  '$ CREATININE 1.46* 1.14 1.02 1.07 1.13 0.99  CALCIUM 10.0 9.2 9.0 9.3 9.3 9.5  MG 2.8*  --   --  2.2 2.1 2.0  PHOS  --   --   --  2.6 3.9 4.1   GFR: Estimated Creatinine Clearance: 66.4 mL/min (by C-G formula based on SCr of 0.99 mg/dL). Liver Function Tests: Recent Labs  Lab 11/18/20 0454 11/19/20 0430 11/20/20 0607 11/21/20 0441 11/22/20 0331  AST '26 20 21 21 18  '$ ALT 59* 44 34 29 24  ALKPHOS 202* 179* 163* 153* 136*  BILITOT 1.0 0.7 0.7 0.5 0.6  PROT 7.5 7.1 7.1 7.1 6.7  ALBUMIN 3.2* 3.1* 3.1* 3.2* 3.0*   No results for input(s): LIPASE, AMYLASE in the last 168 hours. No results for input(s): AMMONIA in the last 168 hours. Coagulation Profile: No results  for input(s): INR, PROTIME in the last 168 hours. Cardiac Enzymes: No results for input(s): CKTOTAL, CKMB, CKMBINDEX, TROPONINI in the last 168 hours. BNP (last 3 results) No results for input(s): PROBNP in the last 8760 hours. HbA1C: No results for input(s): HGBA1C in the last 72 hours. CBG: Recent Labs  Lab 11/21/20 0824 11/21/20 1644 11/21/20 2326 11/22/20 0753 11/22/20 1144  GLUCAP 129* 132* 128* 119* 155*   Lipid Profile: Recent Labs    11/20/20 0607 11/22/20 0331  TRIG 85 68   Thyroid Function Tests: No results for input(s): TSH, T4TOTAL, FREET4, T3FREE, THYROIDAB in the last 72 hours. Anemia Panel: No results for input(s): VITAMINB12, FOLATE, FERRITIN, TIBC, IRON, RETICCTPCT in the last 72 hours. Urine analysis:    Component Value Date/Time   COLORURINE YELLOW (A) 11/14/2020 1650   APPEARANCEUR CLEAR (A) 11/14/2020  1650   LABSPEC 1.020 11/14/2020 1650   PHURINE 5.5 11/14/2020 1650   GLUCOSEU NEGATIVE 11/14/2020 1650   HGBUR NEGATIVE 11/14/2020 1650   BILIRUBINUR SMALL (A) 11/14/2020 1650   KETONESUR NEGATIVE 11/14/2020 1650   PROTEINUR TRACE (A) 11/14/2020 1650   UROBILINOGEN 2.0 (H) 08/20/2006 1249   NITRITE NEGATIVE 11/14/2020 1650   LEUKOCYTESUR NEGATIVE 11/14/2020 1650   Recent Results (from the past 240 hour(s))  Resp Panel by RT-PCR (Flu A&B, Covid) Nasopharyngeal Swab     Status: Abnormal   Collection Time: 11/14/20 12:58 PM   Specimen: Nasopharyngeal Swab; Nasopharyngeal(NP) swabs in vial transport medium  Result Value Ref Range Status   SARS Coronavirus 2 by RT PCR POSITIVE (A) NEGATIVE Final    Comment: RESULT CALLED TO, READ BACK BY AND VERIFIED WITH: Lenn Cal, RN [phone] KDS (NOTE) SARS-CoV-2 target nucleic acids are DETECTED.  The SARS-CoV-2 RNA is generally detectable in upper respiratory specimens during the acute phase of infection. Positive results are indicative of the presence of the identified virus, but do not rule out bacterial  infection or co-infection with other pathogens not detected by the test. Clinical correlation with patient history and other diagnostic information is necessary to determine patient infection status. The expected result is Negative.  Fact Sheet for Patients: EntrepreneurPulse.com.au  Fact Sheet for Healthcare Providers: IncredibleEmployment.be  This test is not yet approved or cleared by the [place] FDA and  has been authorized for detection and/or diagnosis of SARS-CoV-2 by FDA under an Emergency Use Authorization (EUA).  This EUA will remain in effect (meaning this test can be used)  for the duration of  the COVID-19 declaration under Section 564(b)(1) of the Act, 21 U.S.C. section 360bbb-3(b)(1), unless the authorization is terminated or revoked sooner.     Influenza A by PCR NEGATIVE NEGATIVE Final   Influenza B by PCR NEGATIVE NEGATIVE Final    Comment: (NOTE) The Xpert Xpress SARS-CoV-2/FLU/RSV plus assay is intended as an aid in the diagnosis of influenza from Nasopharyngeal swab specimens and should not be used as a sole basis for treatment. Nasal washings and aspirates are unacceptable for Xpert Xpress SARS-CoV-2/FLU/RSV testing.  Fact Sheet for Patients: EntrepreneurPulse.com.au  Fact Sheet for Healthcare Providers: IncredibleEmployment.be  This test is not yet approved or cleared by the [place] FDA and has been authorized for detection and/or diagnosis of SARS-CoV-2 by FDA under an Emergency Use Authorization (EUA). This EUA will remain in effect (meaning this test can be used) for the duration of the COVID-19 declaration under Section 564(b)(1) of the Act, 21 U.S.C. section 360bbb-3(b)(1), unless the authorization is terminated or revoked.  Performed at [facility], [place] [address], [place], [place] [address]       Radiology Studies: No results  found.  Scheduled Meds:  bisacodyl  10 mg Rectal Daily   Chlorhexidine Gluconate Cloth  6 each Topical Daily   insulin aspart  0-9 Units Subcutaneous Q8H   lip balm  1 application Topical BID   sodium chloride flush  10-40 mL Intracatheter Q12H   Continuous Infusions:  dextrose 5 % and 0.45% NaCl 40 mL/hr at 11/21/20 1302   dextrose 5 % and 0.45% NaCl     methocarbamol (ROBAXIN) IV 1,000 mg (11/17/20 1313)   ondansetron (ZOFRAN) IV     TPN ADULT (ION) 60 mL/hr at 11/21/20 1832   TPN ADULT (ION)       LOS: 8 days   Time spent: 35 minutes.  Meredith Leeds  Bonner Puna, MD [facility] [email or web address] 11/22/2020, 3:59 PM

## 2020-11-22 NOTE — Progress Notes (Signed)
[facility] Progress Note  Frank Garrett 59 y.o. 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    Subjective: Patient lying comfortably in bed with NG tube in place in good spirits.  About 200 cc of green bile in the canister. Denies AB pain, nausea, vomiting.  Had BM this AM, loose stools 2-3 times a day which is normal for him since his cholecystectomy.  No fever, chills, shortness of breath or chest pain.  Spoke with wife on the phone.   ROS : Review of Systems  Constitutional:  Negative for chills and fever.  Respiratory:  Negative for shortness of breath.   Cardiovascular:  Negative for chest pain and leg swelling.  Gastrointestinal:  Positive for diarrhea (loose stools normal for patient x 5 years). Negative for abdominal pain, blood in stool, constipation, nausea and vomiting.  Musculoskeletal:  Negative for falls.  Neurological:  Negative for focal weakness.  Psychiatric/Behavioral:  Negative for memory loss. The patient is not nervous/anxious.      Objective: Vital signs in last 24 hours: Vitals:   11/21/20 2145 11/22/20 0452  BP: 103/66 100/66  Pulse: (!) 55 61  Resp: 19 16  Temp: 98.7 F (37.1 C) 98.3 F (36.8 C)  SpO2: 100% 99%    Physical Exam: Physical Exam Constitutional:      General: He is not in acute distress.    Appearance: He is cachectic.     Comments: NGT in place  Eyes:     General: No scleral icterus.    Conjunctiva/sclera: Conjunctivae normal.  Cardiovascular:     Rate and Rhythm: Normal rate and regular rhythm.  Pulmonary:     Effort: Pulmonary effort is normal. No respiratory distress.  Abdominal:     General: There is no distension.     Palpations: Abdomen is soft.     Tenderness: There is no abdominal tenderness. There is no guarding or rebound.  Musculoskeletal:        General: Normal range of motion.  Skin:    Coloration: Skin is not  jaundiced.  Neurological:     General: No focal deficit present.     Mental Status: He is oriented to person, place, and time.  Psychiatric:        Mood and Affect: Mood normal.        Behavior: Behavior normal.     Lab Results: Recent Labs    11/21/20 0441 11/22/20 0331  NA 138 140  K 4.8 4.3  CL 101 102  CO2 26 29  GLUCOSE 119* 104*  BUN 15 18  CREATININE 1.13 0.99  CALCIUM 9.3 9.5  MG 2.1 2.0  PHOS 3.9 4.1   Recent Labs    11/21/20 0441 11/22/20 0331  AST 21 18  ALT 29 24  ALKPHOS 153* 136*  BILITOT 0.5 0.6  PROT 7.1 6.7  ALBUMIN 3.2* 3.0*   No results for input(s): WBC, NEUTROABS, HGB, HCT, MCV, PLT in the last 72 hours. No results for input(s): LABPROT, INR in the last 72 hours.  Lab Results: Results for orders placed or performed during the hospital encounter of 11/14/20 (from the past 48 hour(s))  Glucose, capillary     Status: None   Collection Time: 11/20/20  4:05 PM  Result Value Ref Range   Glucose-Capillary 73 70 - 99 mg/dL    Comment: Glucose reference range applies only to samples taken after fasting for at least 8 hours.  Glucose, capillary     Status: Abnormal   Collection Time: 11/21/20 12:13 AM  Result Value Ref Range   Glucose-Capillary 123 (H) 70 - 99 mg/dL    Comment: Glucose reference range applies only to samples taken after fasting for at least 8 hours.  Comprehensive metabolic panel     Status: Abnormal   Collection Time: 11/21/20  4:41 AM  Result Value Ref Range   Sodium 138 135 - 145 mmol/L   Potassium 4.8 3.5 - 5.1 mmol/L   Chloride 101 98 - 111 mmol/L   CO2 26 22 - 32 mmol/L   Glucose, Bld 119 (H) 70 - 99 mg/dL    Comment: Glucose reference range applies only to samples taken after fasting for at least 8 hours.   BUN 15 6 - 20 mg/dL   Creatinine, Ser 1.13 0.61 - 1.24 mg/dL   Calcium 9.3 8.9 - 10.3 mg/dL   Total Protein 7.1 6.5 - 8.1 g/dL   Albumin 3.2 (L) 3.5 - 5.0 g/dL   AST 21 15 - 41 U/L   ALT 29 0 - 44 U/L   Alkaline  Phosphatase 153 (H) 38 - 126 U/L   Total Bilirubin 0.5 0.3 - 1.2 mg/dL   GFR, Estimated >60 >60 mL/min    Comment: (NOTE) Calculated using the CKD-EPI Creatinine Equation (2021)    Anion gap 11 5 - 15    Comment: Performed at [facility], [place] [address] [place], [place] [address]  Magnesium     Status: None   Collection Time: 11/21/20  4:41 AM  Result Value Ref Range   Magnesium 2.1 1.7 - 2.4 mg/dL    Comment: Performed at [facility], [place] [address], [place], [place] [address]  Phosphorus     Status: None   Collection Time: 11/21/20  4:41 AM  Result Value Ref Range   Phosphorus 3.9 2.5 - 4.6 mg/dL    Comment: Performed at [facility], [place] [address], [place], [place] [address]  Glucose, capillary     Status: Abnormal   Collection Time: 11/21/20  8:24 AM  Result Value Ref Range   Glucose-Capillary 129 (H) 70 - 99 mg/dL    Comment: Glucose reference range applies only to samples taken after fasting for at least 8 hours.  Glucose, capillary     Status: Abnormal   Collection Time: 11/21/20  4:44 PM  Result Value Ref Range   Glucose-Capillary 132 (H) 70 - 99 mg/dL    Comment: Glucose reference range applies only to samples taken after fasting for at least 8 hours.  Glucose, capillary     Status: Abnormal   Collection Time: 11/21/20 11:26 PM  Result Value Ref Range   Glucose-Capillary 128 (H) 70 - 99 mg/dL    Comment: Glucose reference range applies only to samples taken after fasting for at least 8 hours.  Comprehensive metabolic panel     Status: Abnormal   Collection Time: 11/22/20  3:31 AM  Result Value Ref Range   Sodium 140 135 - 145 mmol/L   Potassium 4.3 3.5 - 5.1 mmol/L   Chloride 102 98 - 111 mmol/L   CO2 29 22 - 32 mmol/L   Glucose, Bld 104 (H) 70 - 99 mg/dL    Comment: Glucose reference range applies only to samples taken after fasting for at least 8 hours.   BUN 18 6 - 20 mg/dL   Creatinine, Ser 0.99 0.61  - 1.24 mg/dL  Calcium 9.5 8.9 - 10.3 mg/dL   Total Protein 6.7 6.5 - 8.1 g/dL   Albumin 3.0 (L) 3.5 - 5.0 g/dL   AST 18 15 - 41 U/L   ALT 24 0 - 44 U/L   Alkaline Phosphatase 136 (H) 38 - 126 U/L   Total Bilirubin 0.6 0.3 - 1.2 mg/dL   GFR, Estimated >60 >60 mL/min    Comment: (NOTE) Calculated using the CKD-EPI Creatinine Equation (2021)    Anion gap 9 5 - 15    Comment: Performed at [facility], [place] [address] [place], [place] [address]  Magnesium     Status: None   Collection Time: 11/22/20  3:31 AM  Result Value Ref Range   Magnesium 2.0 1.7 - 2.4 mg/dL    Comment: Performed at [facility], [place] [address], [place], [place] [address]  Phosphorus     Status: None   Collection Time: 11/22/20  3:31 AM  Result Value Ref Range   Phosphorus 4.1 2.5 - 4.6 mg/dL    Comment: Performed at [facility], [place] [address], [place], [place] [address]  Triglycerides     Status: None   Collection Time: 11/22/20  3:31 AM  Result Value Ref Range   Triglycerides 68 <150 mg/dL    Comment: Performed at [facility], [place] [address] [place], [place] [address]  Glucose, capillary     Status: Abnormal   Collection Time: 11/22/20  7:53 AM  Result Value Ref Range   Glucose-Capillary 119 (H) 70 - 99 mg/dL    Comment: Glucose reference range applies only to samples taken after fasting for at least 8 hours.    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 -History of pulmonary embolism -currently on full dose Lovenox  Plan: Plan for enteroscopy with possible duodenal/jejunal stent placement tomorrow with Dr. Watt Climes.  Patient NPO Hold lovenox Monday night Risks (bleeding, infection, bowel perforation that could require surgery, sedation-related changes in cardiopulmonary systems), benefits (identification and possible treatment of source  of symptoms, exclusion of certain causes of symptoms), and alternatives (watchful waiting, radiographic imaging studies, empiric medical treatment)  were explained to patient and family in detail and patient wishes to proceed.      Vladimir Crofts PA-C 11/22/2020, 9:37 AM

## 2020-11-22 NOTE — Progress Notes (Signed)
Progress Note     Subjective: Patient in good spirits this AM. Denies abdominal pain. NGT in place, plans for GI possible stent tomorrow.   Objective: Vital signs in last 24 hours: Temp:  [98.3 F (36.8 C)-98.7 F (37.1 C)] 98.3 F (36.8 C) (09/12 0452) Pulse Rate:  [55-63] 61 (09/12 0452) Resp:  [16-19] 16 (09/12 0452) BP: (100-103)/(66-73) 100/66 (09/12 0452) SpO2:  [98 %-100 %] 99 % (09/12 0452) Last BM Date: 11/21/20  Intake/Output from previous day: 09/11 0701 - 09/12 0700 In: 2685.2 [I.V.:2485.2; NG/GT:200] Out: 1700 [Emesis/NG output:1700] Intake/Output this shift: No intake/output data recorded.  PE: General appearance: alert, cooperative, no distress GI: abdomen soft, nontender, NG is draining green bilious fluid   Lab Results:  No results for input(s): WBC, HGB, HCT, PLT in the last 72 hours. BMET Recent Labs    11/21/20 0441 11/22/20 0331  NA 138 140  K 4.8 4.3  CL 101 102  CO2 26 29  GLUCOSE 119* 104*  BUN 15 18  CREATININE 1.13 0.99  CALCIUM 9.3 9.5   PT/INR No results for input(s): LABPROT, INR in the last 72 hours. CMP     Component Value Date/Time   NA 140 11/22/2020 0331   K 4.3 11/22/2020 0331   CL 102 11/22/2020 0331   CO2 29 11/22/2020 0331   GLUCOSE 104 (H) 11/22/2020 0331   BUN 18 11/22/2020 0331   CREATININE 0.99 11/22/2020 0331   CALCIUM 9.5 11/22/2020 0331   PROT 6.7 11/22/2020 0331   ALBUMIN 3.0 (L) 11/22/2020 0331   AST 18 11/22/2020 0331   ALT 24 11/22/2020 0331   ALKPHOS 136 (H) 11/22/2020 0331   BILITOT 0.6 11/22/2020 0331   GFRNONAA >60 11/22/2020 0331   GFRAA >60 11/11/2016 0404   Lipase     Component Value Date/Time   LIPASE 207 (H) 11/14/2020 1152       Studies/Results: No results found.  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 - UGI with obstruction at level of duodenal/jejunal junction - GI consulted to evaluate for possible stent placement and planning possibly early next week - GI planning possible stent placement tomorrow  - no indication for emergent surgical intervention, hopefully can avoid any surgical intervention if able to stent - surgery will follow peripherally but are available as needed   FEN: ice chips/sips, IVF per TRH, NGT to LIWS ID: None DVT: Lovenox     COVID-positive -asymptomatic, off precautions today    Hypertension Chronic diarrhea GERD Dehydration - Cr 0.99 Leukopenia -  WBC 3.8 (9/9)   Paliative Care recommendations as of 11/17/20 Continue to treat the treatable When his wife brings [facility] POA paperwork and we will scanned this into the system Hold off on further goals of care discussion for now May need further [place] discussion if any significant clinical change PMT will continue to follow  LOS: 8 days    Norm Parcel,  [facility], [facility] 11/22/2020, 11:38 AM Please see Amion for pager number during day hours 7:00am-4:30pm

## 2020-11-22 NOTE — Progress Notes (Signed)
PHARMACY - TOTAL PARENTERAL NUTRITION CONSULT NOTE   Indication: Small bowel obstruction  Patient Measurements: Height: '5\' 8"'$  (172.7 cm) Weight: 57.7 kg (127 lb 3.3 oz) IBW/kg (Calculated) : 68.4 TPN AdjBW (KG): 57.7 Body mass index is 19.34 kg/m.  Assessment: 60 yo male with metastatic pancreatic cancer followed by [facility] [place] [place] now with continued SBO due to tumor to start TPN.   Glucose / Insulin: CBGs since TPN started at goal < 150: 104, 128, 129 - 3 units SSI required Electrolytes: All WNL Renal: SCr improved 0.99, BUN 18, UOP not measured, 2x occurences Hepatic: AST/ALT WNL, AlkPhos 136 TG: 85 (9/10), 68 (9/12)  Intake / Output; MIVF: I/O inaccurate, currently on D51/2NS 40 ml/hr - 1760m emesis, 2061mNGT output, BM x 2 GI Imaging: GI Surgeries / Procedures: plan for 9/13: per GI, possible duodenal/jejunal stent placement  Central access: patient already with [facility] TPN start date: 9/10  Nutritional Goals: Goal TPN rate is 80 mL/hr (provides 96 g of protein and 1939 kcals per day)  RD Assessment: Estimated Needs Total Energy Estimated Needs: 1800-2000 Total Protein Estimated Needs: 90-105g Total Fluid Estimated Needs: 2L/day  Current Nutrition:  NPO  Plan:  Increase TPN from 60 to goal 80 ml/hr at 1800 Electrolytes in TPN: Na 7512mL, K 66m81m, Ca 5mEq41m Mg 5mEq/25mand Phos 15mmol14mCl:Ac 1:1 (no changes) Add standard MVI and trace elements to TPN Continue Sensitive q8h SSI and adjust as needed  Red[facility] labs on Mon/Thurs, and as needed  Hiroto Saltzman WiPeggyann JubaD, BCPS Pharmacy: [phone] 7:16 AM

## 2020-11-23 ENCOUNTER — Encounter (HOSPITAL_COMMUNITY): Payer: Self-pay | Admitting: Internal Medicine

## 2020-11-23 ENCOUNTER — Inpatient Hospital Stay (HOSPITAL_COMMUNITY): Payer: Commercial Managed Care - PPO | Admitting: Certified Registered Nurse Anesthetist

## 2020-11-23 ENCOUNTER — Inpatient Hospital Stay (HOSPITAL_COMMUNITY): Payer: Commercial Managed Care - PPO

## 2020-11-23 ENCOUNTER — Encounter (HOSPITAL_COMMUNITY)
Admission: EM | Disposition: A | Discharge status: Home / Self Care | Payer: Self-pay | Source: Home / Self Care | Attending: Internal Medicine

## 2020-11-23 HISTORY — PX: ENTEROSCOPY: SHX5533

## 2020-11-23 LAB — CBC WITH DIFFERENTIAL/PLATELET
Abs Immature Granulocytes: 0.56 10*3/uL — ABNORMAL HIGH (ref 0.00–0.07)
Basophils Absolute: 0.1 10*3/uL (ref 0.0–0.1)
Basophils Relative: 1 %
Eosinophils Absolute: 0.1 10*3/uL (ref 0.0–0.5)
Eosinophils Relative: 1 %
HCT: 28.3 % — ABNORMAL LOW (ref 39.0–52.0)
Hemoglobin: 8.8 g/dL — ABNORMAL LOW (ref 13.0–17.0)
Immature Granulocytes: 7 %
Lymphocytes Relative: 21 %
Lymphs Abs: 1.7 10*3/uL (ref 0.7–4.0)
MCH: 29.5 pg (ref 26.0–34.0)
MCHC: 31.1 g/dL (ref 30.0–36.0)
MCV: 95 fL (ref 80.0–100.0)
Monocytes Absolute: 1.5 10*3/uL — ABNORMAL HIGH (ref 0.1–1.0)
Monocytes Relative: 18 %
Neutro Abs: 4.5 10*3/uL (ref 1.7–7.7)
Neutrophils Relative %: 52 %
Platelets: 405 10*3/uL — ABNORMAL HIGH (ref 150–400)
RBC: 2.98 MIL/uL — ABNORMAL LOW (ref 4.22–5.81)
RDW: 17.9 % — ABNORMAL HIGH (ref 11.5–15.5)
WBC: 8.3 10*3/uL (ref 4.0–10.5)
nRBC: 0.6 % — ABNORMAL HIGH (ref 0.0–0.2)

## 2020-11-23 LAB — GLUCOSE, CAPILLARY
Glucose-Capillary: 120 mg/dL — ABNORMAL HIGH (ref 70–99)
Glucose-Capillary: 127 mg/dL — ABNORMAL HIGH (ref 70–99)
Glucose-Capillary: 155 mg/dL — ABNORMAL HIGH (ref 70–99)
Glucose-Capillary: 172 mg/dL — ABNORMAL HIGH (ref 70–99)

## 2020-11-23 LAB — COMPREHENSIVE METABOLIC PANEL
ALT: 21 U/L (ref 0–44)
AST: 17 U/L (ref 15–41)
Albumin: 3.1 g/dL — ABNORMAL LOW (ref 3.5–5.0)
Alkaline Phosphatase: 136 U/L — ABNORMAL HIGH (ref 38–126)
Anion gap: 6 (ref 5–15)
BUN: 22 mg/dL — ABNORMAL HIGH (ref 6–20)
CO2: 30 mmol/L (ref 22–32)
Calcium: 9.6 mg/dL (ref 8.9–10.3)
Chloride: 105 mmol/L (ref 98–111)
Creatinine, Ser: 0.74 mg/dL (ref 0.61–1.24)
GFR, Estimated: >60 mL/min (ref >60)
Glucose, Bld: 114 mg/dL — ABNORMAL HIGH (ref 70–99)
Potassium: 4.7 mmol/L (ref 3.5–5.1)
Sodium: 141 mmol/L (ref 135–145)
Total Bilirubin: 0.3 mg/dL (ref 0.3–1.2)
Total Protein: 6.8 g/dL (ref 6.5–8.1)

## 2020-11-23 LAB — MAGNESIUM: Magnesium: 2.2 mg/dL (ref 1.7–2.4)

## 2020-11-23 LAB — PHOSPHORUS: Phosphorus: 3.5 mg/dL (ref 2.5–4.6)

## 2020-11-23 SURGERY — ENTEROSCOPY
Anesthesia: General

## 2020-11-23 MED ORDER — PROPOFOL 10 MG/ML IV BOLUS
INTRAVENOUS | Status: DC | PRN
  Administered 2020-11-23: 150 mg via INTRAVENOUS

## 2020-11-23 MED ORDER — LIDOCAINE 2% (20 MG/ML) 5 ML SYRINGE
INTRAMUSCULAR | Status: DC | PRN
  Administered 2020-11-23: 60 mg via INTRAVENOUS

## 2020-11-23 MED ORDER — ESMOLOL HCL 100 MG/10ML IV SOLN
INTRAVENOUS | Status: DC | PRN
  Administered 2020-11-23: 10 mg via INTRAVENOUS

## 2020-11-23 MED ORDER — LACTATED RINGERS IV SOLN: INTRAVENOUS | Status: DC

## 2020-11-23 MED ORDER — FENTANYL CITRATE (PF) 100 MCG/2ML IJ SOLN
INTRAMUSCULAR | Status: AC
  Filled 2020-11-23: qty 2

## 2020-11-23 MED ORDER — SUCCINYLCHOLINE CHLORIDE 200 MG/10ML IV SOSY
PREFILLED_SYRINGE | INTRAVENOUS | Status: DC | PRN
  Administered 2020-11-23: 120 mg via INTRAVENOUS

## 2020-11-23 MED ORDER — TRAVASOL 10 % IV SOLN
INTRAVENOUS | Status: DC
  Filled 2020-11-23: qty 960

## 2020-11-23 MED ORDER — ONDANSETRON HCL 4 MG/2ML IJ SOLN
INTRAMUSCULAR | Status: DC | PRN
  Administered 2020-11-23: 4 mg via INTRAVENOUS

## 2020-11-23 MED ORDER — PHENYLEPHRINE 40 MCG/ML (10ML) SYRINGE FOR IV PUSH (FOR BLOOD PRESSURE SUPPORT)
PREFILLED_SYRINGE | INTRAVENOUS | Status: DC | PRN
  Administered 2020-11-23: 80 ug via INTRAVENOUS
  Administered 2020-11-23: 120 ug via INTRAVENOUS
  Administered 2020-11-23 (×2): 80 ug via INTRAVENOUS

## 2020-11-23 MED ORDER — FENTANYL CITRATE (PF) 100 MCG/2ML IJ SOLN
INTRAMUSCULAR | Status: DC | PRN
  Administered 2020-11-23: 25 ug via INTRAVENOUS
  Administered 2020-11-23: 50 ug via INTRAVENOUS
  Administered 2020-11-23: 25 ug via INTRAVENOUS

## 2020-11-23 MED ORDER — SODIUM CHLORIDE 0.9 % IV SOLN: INTRAVENOUS | Status: DC

## 2020-11-23 MED ORDER — DEXAMETHASONE SODIUM PHOSPHATE 4 MG/ML IJ SOLN
INTRAMUSCULAR | Status: DC | PRN
  Administered 2020-11-23: 4 mg via INTRAVENOUS

## 2020-11-23 NOTE — Anesthesia Preprocedure Evaluation (Signed)
Anesthesia Evaluation  Patient identified by MRN, date of birth, ID band Patient awake    Reviewed: Allergy & Precautions, NPO status , Patient's Chart, lab work & pertinent test results  History of Anesthesia Complications Negative for: history of anesthetic complications  Airway Mallampati: II  TM Distance: >3 FB Neck ROM: Full    Dental  (+) Poor Dentition, Missing, Dental Advisory Given   Pulmonary neg shortness of breath, neg sleep apnea, neg COPD, neg recent URI, former smoker,    breath sounds clear to auscultation       Cardiovascular hypertension, (-) angina(-) Past MI and (-) CHF (-) dysrhythmias  Rhythm:Regular     Neuro/Psych negative neurological ROS  negative psych ROS   GI/Hepatic GERD  ,sbo with ng tube S/p whipple for pancreatic ca   Endo/Other  negative endocrine ROS  Renal/GU Lab Results      Component                Value               Date                      CREATININE               0.74                11/23/2020           Lab Results      Component                Value               Date                      K                        4.7                 11/23/2020                Musculoskeletal   Abdominal   Peds  Hematology  (+) Blood dyscrasia, anemia , Lab Results      Component                Value               Date                      WBC                      8.3                 11/23/2020                HGB                      8.8 (L)             11/23/2020                HCT                      28.3 (L)            11/23/2020                MCV  95.0                11/23/2020                PLT                      405 (H)             11/23/2020              Anesthesia Other Findings   Reproductive/Obstetrics                             Anesthesia Physical Anesthesia Plan  ASA: 3  Anesthesia Plan: General   Post-op Pain  Management:    Induction: Intravenous, Rapid sequence and Cricoid pressure planned  PONV Risk Score and Plan: 2 and Ondansetron and Dexamethasone  Airway Management Planned: Oral ETT  Additional Equipment: None  Intra-op Plan:   Post-operative Plan: Extubation in OR  Informed Consent: I have reviewed the patients History and Physical, chart, labs and discussed the procedure including the risks, benefits and alternatives for the proposed anesthesia with the patient or authorized representative who has indicated his/her understanding and acceptance.     Dental advisory given  Plan Discussed with: CRNA and Anesthesiologist  Anesthesia Plan Comments:         Anesthesia Quick Evaluation

## 2020-11-23 NOTE — Progress Notes (Signed)
PHARMACY - TOTAL PARENTERAL NUTRITION CONSULT NOTE   Indication: Small bowel obstruction  Patient Measurements: Height: '5\' 8"'$  (172.7 cm) Weight: 57.7 kg (127 lb 3.3 oz) IBW/kg (Calculated) : 68.4 TPN AdjBW (KG): 57.7 Body mass index is 19.34 kg/m.  Assessment: 60 yo male with metastatic pancreatic cancer followed by [facility] [place] [facility] [place] now with continued SBO due to tumor to start TPN.   Glucose / Insulin: CBGs since TPN started at goal < 150: range 12-127 - 2 units SSI required/24hr Electrolytes: All WNL, K+ trending up 4.7 Renal: SCr improved 0.74, UOP not measured Hepatic: AST/ALT WNL, AlkPhos 136 TG: 85 (9/10), 68 (9/12)  Intake / Output; MIVF: I/O inaccurate, currently on D51/2NS 40 ml/hr - 840m NGT output, no BM recorded in past 24hr - previous loose stools noted GI Imaging: GI Surgeries / Procedures: plan for 9/13: per GI, duodenal/jejunal stent placement  Central access: patient already with [facility] TPN start date: 9/10  Nutritional Goals: Goal TPN rate is 80 mL/hr (provides 96 g of protein and 1939 kcals per day)  RD Assessment: Estimated Needs Total Energy Estimated Needs: 1800-2000 Total Protein Estimated Needs: 90-105g Total Fluid Estimated Needs: 2L/day  Current Nutrition:  NPO  Plan:  Continue TPN at goal 80 ml/hr  Electrolytes in TPN:  Na 72m/L K 305mL (decrease)  Ca 5mE60m  Mg 5mEq8m Phos 15mmo62m Cl:Ac 1:1  Add standard MVI and trace elements to TPN Continue Sensitive q8h SSI and adjust as needed  [facility] labs on Mon/Thurs, and as needed  Zacaria Pousson WPeggyann JubamD, BCPS Pharmacy: [phone] 8:16 AM

## 2020-11-23 NOTE — Progress Notes (Signed)
PROGRESS NOTE  Frank Garrett  [id number] DOB: February 08, 1961 DOA: 11/14/2020 PCP: Tobie Lords D, FNP   Brief Narrative: Frank Garrett is a 60 y.o. male with a history of pancreatic CA s/p [place] 2018 and chemotherapy, breakthrough covid-19 infection Jan 2022 who presented to the ED 9/4 with nausea, vomiting due to small bowel obstruction which is felt to be due to adhesions from surgery vs. neoplastic invasion. Symptoms have improved dramatically with NG Tube, NPO status, and TPN has been started. GI is planning attempt at endoscopic placement of stent at duodenum-jejunum junction on 9/13.  Assessment & Plan: Principal Problem:   SBO (small bowel obstruction) (HCC) Active Problems:   Nausea and vomiting   Anemia of chronic disease   Asplenia   Pancreas cancer (HCC)   Chronic anticoagulation   Liver metastasis from pancreas cancer   Neutropenia (HCC)   On antineoplastic chemotherapy   Pressure injury of skin  Partial SBO:  - Continue TPN, started 9/10. K trending up, LFTs stable, insulin dosing appears good. - Continue NPO, NGT. Plan per surgery/GI is attempted placement at stent at point of stricture (duodenum-jejunum junction) on 9/13. Will hold anticoagulation per their recommendation.  - Alternative possible plans would include palliative venting gastrostomy   Positive covid-19 PCR: on 9/4. With cycle threshold of 33.6 and no attributable symptoms, this could be seen as a persistent (false) positive in an immunocompromised patient who had covid earlier this year.  - Based on prior hospitalist plans of care, isolation has been discontinued and the patient has remained asymptomatic from this perspective requiring no treatment.   History of PE (Aug 2022): No current hypoxia or evidence of RV overload.  - Hold anticoagulation for now w/procedural plans. Restart when cleared.  Metastatic pancreatic CA:  - Continue care through [facility]. Do not suspect oncology  evaluation would be very helpful at this time, though if stricture is due to neoplasm and cannot be stented, could consider consultation for prognostication.  AKI, hypernatremia: Resolved.  Neutropenia, anemia: Polychromasia present. Had some schistocytes previously, none seen 9/13 and hgb stable at 8.8 g/dl. WBC has normalized, so should not have problems with procedure.   RN Pressure Injury Documentation: Pressure Injury 11/22/20 Sacrum Lower Stage 2 -  Partial thickness loss of dermis presenting as a shallow open injury with a red, pink wound bed without slough. Pink, no drainage. Surrounding skin in tact. (Active)  11/22/20 0829  Location: Sacrum  Location Orientation: Lower  Staging: Stage 2 -  Partial thickness loss of dermis presenting as a shallow open injury with a red, pink wound bed without slough.  Wound Description (Comments): Pink, no drainage. Surrounding skin in tact.  Present on Admission: Yes   DVT prophylaxis: Therapeutic lovenox (last dose 9/12 AM) Code Status: Full Family Communication: Family at bedside Disposition Plan:  Status is: Inpatient  Remains inpatient appropriate because:IV treatments appropriate due to intensity of illness or inability to take PO  Dispo: The patient is from: Home              Anticipated d/c is to: Home              Patient currently is not medically stable to d/c.   Difficult to place patient No  Consultants:  General surgery GI Palliative care  Procedures:  Enteroscopy 11/23/2020 Dr. Watt Climes (planned)  Antimicrobials: None   Subjective: Felt very cold last night, thermostat was turned down. "Thawing out" now. No chills, fevers, abd pain, N/V. Having  stool output.   Objective: Vitals:   11/22/20 0452 11/22/20 1502 11/22/20 2111 11/23/20 0516  BP: 100/66 101/64 97/66 108/69  Pulse: 61 (!) 56 (!) 57 66  Resp: '16 18 16 15  '$ Temp: 98.3 F (36.8 C) 98.3 F (36.8 C) 98.7 F (37.1 C) 98.5 F (36.9 C)  TempSrc: Oral Oral Oral  Oral  SpO2: 99% 100% 100% 100%  Weight:      Height:        Intake/Output Summary (Last 24 hours) at 11/23/2020 1031 Last data filed at 11/23/2020 0646 Gross per 24 hour  Intake 641.12 ml  Output 830 ml  Net -188.88 ml   Filed Weights   11/14/20 1839  Weight: 57.7 kg   Gen: Pleasant, chronically ill-appearing male in no distress Pulm: Nonlabored breathing room air. Clear. HEENT: NGT in place, dark nonbloody output in canister.  CV: Regular rate and rhythm. No murmur, rub, or gallop. No JVD, no dependent edema. GI: Abdomen very soft, non-tender, non-distended, with normoactive bowel sounds.  Ext: Warm, no deformities Skin: No new rashes, lesions or ulcers on visualized skin. Left chest port site without tenderness, exudate, or erythema Neuro: Alert and oriented. No focal neurological deficits. Psych: Judgement and insight appear fair. Mood euthymic & affect congruent. Behavior is appropriate.    Data Reviewed: I have personally reviewed following labs and imaging studies  CBC: Recent Labs  Lab 11/17/20 0408 11/18/20 0454 11/19/20 0430 11/23/20 0722  WBC 2.9* 3.1* 3.8* 8.3  NEUTROABS 0.7* 0.8* 1.4* 4.5  HGB 9.7* 9.2* 8.8* 8.8*  HCT 31.2* 30.2* 28.9* 28.3*  MCV 93.7 95.6 95.7 95.0  PLT 269 298 357 123456*   Basic Metabolic Panel: Recent Labs  Lab 11/17/20 0408 11/18/20 0454 11/19/20 0430 11/20/20 0607 11/21/20 0441 11/22/20 0331 11/23/20 0723  NA 145   < > 138 139 138 140 141  K 3.8   < > 4.5 4.6 4.8 4.3 4.7  CL 108   < > 105 103 101 102 105  CO2 29   < > '27 26 26 29 30  '$ GLUCOSE 137*   < > 131* 126* 119* 104* 114*  BUN 36*   < > '16 14 15 18 '$ 22*  CREATININE 1.46*   < > 1.02 1.07 1.13 0.99 0.74  CALCIUM 10.0   < > 9.0 9.3 9.3 9.5 9.6  MG 2.8*  --   --  2.2 2.1 2.0 2.2  PHOS  --   --   --  2.6 3.9 4.1 3.5   < > = values in this interval not displayed.   GFR: Estimated Creatinine Clearance: 82.1 mL/min (by C-G formula based on SCr of 0.74 mg/dL). Liver Function  Tests: Recent Labs  Lab 11/19/20 0430 11/20/20 0607 11/21/20 0441 11/22/20 0331 11/23/20 0723  AST '20 21 21 18 17  '$ ALT 44 34 '29 24 21  '$ ALKPHOS 179* 163* 153* 136* 136*  BILITOT 0.7 0.7 0.5 0.6 0.3  PROT 7.1 7.1 7.1 6.7 6.8  ALBUMIN 3.1* 3.1* 3.2* 3.0* 3.1*   No results for input(s): LIPASE, AMYLASE in the last 168 hours. No results for input(s): AMMONIA in the last 168 hours. Coagulation Profile: No results for input(s): INR, PROTIME in the last 168 hours. Cardiac Enzymes: No results for input(s): CKTOTAL, CKMB, CKMBINDEX, TROPONINI in the last 168 hours. BNP (last 3 results) No results for input(s): PROBNP in the last 8760 hours. HbA1C: No results for input(s): HGBA1C in the last 72 hours. CBG: Recent Labs  Lab 11/22/20 0753 11/22/20 1144 11/22/20 1721 11/23/20 0017 11/23/20 0800  GLUCAP 119* 155* 123* 127* 120*   Lipid Profile: Recent Labs    11/22/20 0331  TRIG 68   Thyroid Function Tests: No results for input(s): TSH, T4TOTAL, FREET4, T3FREE, THYROIDAB in the last 72 hours. Anemia Panel: No results for input(s): VITAMINB12, FOLATE, FERRITIN, TIBC, IRON, RETICCTPCT in the last 72 hours. Urine analysis:    Component Value Date/Time   COLORURINE YELLOW (A) 11/14/2020 1650   APPEARANCEUR CLEAR (A) 11/14/2020 1650   LABSPEC 1.020 11/14/2020 1650   PHURINE 5.5 11/14/2020 1650   GLUCOSEU NEGATIVE 11/14/2020 1650   HGBUR NEGATIVE 11/14/2020 1650   BILIRUBINUR SMALL (A) 11/14/2020 1650   KETONESUR NEGATIVE 11/14/2020 1650   PROTEINUR TRACE (A) 11/14/2020 1650   UROBILINOGEN 2.0 (H) 08/20/2006 1249   NITRITE NEGATIVE 11/14/2020 1650   LEUKOCYTESUR NEGATIVE 11/14/2020 1650   Recent Results (from the past 240 hour(s))  Resp Panel by RT-PCR (Flu A&B, Covid) Nasopharyngeal Swab     Status: Abnormal   Collection Time: 11/14/20 12:58 PM   Specimen: Nasopharyngeal Swab; Nasopharyngeal(NP) swabs in vial transport medium  Result Value Ref Range Status   SARS  Coronavirus 2 by RT PCR POSITIVE (A) NEGATIVE Final    Comment: RESULT CALLED TO, READ BACK BY AND VERIFIED WITH: Lenn Cal, RN [phone] KDS (NOTE) SARS-CoV-2 target nucleic acids are DETECTED.  The SARS-CoV-2 RNA is generally detectable in upper respiratory specimens during the acute phase of infection. Positive results are indicative of the presence of the identified virus, but do not rule out bacterial infection or co-infection with other pathogens not detected by the test. Clinical correlation with patient history and other diagnostic information is necessary to determine patient infection status. The expected result is Negative.  Fact Sheet for Patients: EntrepreneurPulse.com.au  Fact Sheet for Healthcare Providers: IncredibleEmployment.be  This test is not yet approved or cleared by the [place] FDA and  has been authorized for detection and/or diagnosis of SARS-CoV-2 by FDA under an Emergency Use Authorization (EUA).  This EUA will remain in effect (meaning this test can be used)  for the duration of  the COVID-19 declaration under Section 564(b)(1) of the Act, 21 U.S.C. section 360bbb-3(b)(1), unless the authorization is terminated or revoked sooner.     Influenza A by PCR NEGATIVE NEGATIVE Final   Influenza B by PCR NEGATIVE NEGATIVE Final    Comment: (NOTE) The Xpert Xpress SARS-CoV-2/FLU/RSV plus assay is intended as an aid in the diagnosis of influenza from Nasopharyngeal swab specimens and should not be used as a sole basis for treatment. Nasal washings and aspirates are unacceptable for Xpert Xpress SARS-CoV-2/FLU/RSV testing.  Fact Sheet for Patients: EntrepreneurPulse.com.au  Fact Sheet for Healthcare Providers: IncredibleEmployment.be  This test is not yet approved or cleared by the [place] FDA and has been authorized for detection and/or diagnosis of SARS-CoV-2 by FDA under  an Emergency Use Authorization (EUA). This EUA will remain in effect (meaning this test can be used) for the duration of the COVID-19 declaration under Section 564(b)(1) of the Act, 21 U.S.C. section 360bbb-3(b)(1), unless the authorization is terminated or revoked.  Performed at [facility], [place] [address] [place], [place] [address]       Radiology Studies: No results found.  Scheduled Meds:  bisacodyl  10 mg Rectal Daily   Chlorhexidine Gluconate Cloth  6 each Topical Daily   insulin aspart  0-9 Units Subcutaneous Q8H   lip balm  1 application Topical BID   sodium chloride flush  10-40 mL Intracatheter Q12H   Continuous Infusions:  dextrose 5 % and 0.45% NaCl 10 mL/hr at 11/22/20 1902   methocarbamol (ROBAXIN) IV 1,000 mg (11/17/20 1313)   ondansetron (ZOFRAN) IV     TPN ADULT (ION) 80 mL/hr at 11/22/20 1741   TPN ADULT (ION)       LOS: 9 days   Time spent: 35 minutes.  Patrecia Pour, MD [facility] [email or web address] 11/23/2020, 10:31 AM

## 2020-11-23 NOTE — Progress Notes (Signed)
Mobility Specialist - Cancellation Note     11/23/20 1301  Mobility  Activity Off unit    Pt currently off unit for procedure. Mobility will hold off and check back as schedule permits.   [place] Specialist [facility] Services Phone: [phone] 11/23/20, 1:03 PM

## 2020-11-23 NOTE — Progress Notes (Signed)
Chipper Herb 12:59 PM  Subjective: Patient's hospital computer chart reviewed and case discussed with multiple partners of mine and surgical notes appreciated and his history was reviewed and he has no new complaints  Objective: Vital signs stable afebrile no acute distress exam please see preassessment evaluation labs upper GI and CT reviewed  Assessment: Duodenal obstruction  Plan: The risk benefits methods of stent placement was discussed including inability to place stent as well as migration and will proceed today with anesthesia assistance with further work-up and plans pending those findings  [facility]  office [phone] After 5PM or if no answer call [phone]

## 2020-11-23 NOTE — Anesthesia Procedure Notes (Signed)
Procedure Name: Intubation Date/Time: 11/23/2020 1:10 PM Performed by: Eulas Post, Raeford Brandenburg W, CRNA Pre-anesthesia Checklist: Patient identified, Emergency Drugs available, Suction available and Patient being monitored Patient Re-evaluated:Patient Re-evaluated prior to induction Oxygen Delivery Method: Circle system utilized Preoxygenation: Pre-oxygenation with 100% oxygen Induction Type: IV induction Ventilation: Mask ventilation without difficulty Laryngoscope Size: 2 and Miller Grade View: Grade I Tube type: Oral Tube size: 7.5 mm Number of attempts: 1 Airway Equipment and Method: Stylet and Oral airway Placement Confirmation: ETT inserted through vocal cords under direct vision, positive ETCO2 and breath sounds checked- equal and bilateral Secured at: 22 cm Tube secured with: Tape Dental Injury: Teeth and Oropharynx as per pre-operative assessment

## 2020-11-23 NOTE — Transfer of Care (Signed)
Immediate Anesthesia Transfer of Care Note  Patient: Frank Garrett  Procedure(s) Performed: ENTEROSCOPY  Patient Location: PACU and Endoscopy Unit  Anesthesia Type:General  Level of Consciousness: awake, alert  and oriented  Airway & Oxygen Therapy: Patient Spontanous Breathing  Post-op Assessment: Report given to RN and Post -op Vital signs reviewed and stable  Post vital signs: Reviewed and stable  Last Vitals:  Vitals Value Taken Time  BP 100/53 11/23/20 1524  Temp    Pulse 76 11/23/20 1525  Resp 15 11/23/20 1525  SpO2 100 % 11/23/20 1525  Vitals shown include unvalidated device data.  Last Pain:  Vitals:   11/23/20 1524  TempSrc: Oral  PainSc: 0-No pain      Patients Stated Pain Goal: 0 ([phone] [id number])  Complications: No notable events documented.

## 2020-11-23 NOTE — Op Note (Signed)
[facility] Patient Name: Frank Garrett Procedure Date: 11/23/2020 MRN: [id number] Attending MD: Clarene Essex , MD Date of Birth: Jul 13, 1960 CSN: [id number] Age: 60 Admit Type: Outpatient Procedure:                Small bowel enteroscopy Indications:              For therapy of duodenal/jejunal stenosis, Abnormal                            UGI series and CT compatible with obstruction Providers:                Clarene Essex, MD, Kary Kos RN, RN, Tyrone Apple, Technician, Signa Kell, CRNA Referring MD:              Medicines:                General Anesthesia Complications:            No immediate complications. Estimated Blood Loss:     Estimated blood loss: none. Procedure:                Pre-Anesthesia Assessment:                           - Prior to the procedure, a History and Physical                            was performed, and patient medications and                            allergies were reviewed. The patient's tolerance of                            previous anesthesia was also reviewed. The risks                            and benefits of the procedure and the sedation                            options and risks were discussed with the patient.                            All questions were answered, and informed consent                            was obtained. Prior Anticoagulants: The patient has                            taken Lovenox (enoxaparin), last dose was 1 day                            prior to procedure. ASA Grade Assessment: III - A  patient with severe systemic disease. After                            reviewing the risks and benefits, the patient was                            deemed in satisfactory condition to undergo the                            procedure.                           After obtaining informed consent, the endoscope was                            passed under  direct vision. Throughout the                            procedure, the patient's blood pressure, pulse, and                            oxygen saturations were monitored continuously. The                            [id number] Olympus therapeutic endoscope                            was introduced through the mouth and advanced to                            the fourth part of duodenum. The [id number])                            Olympus endoscope was introduced through the and                            advanced to the fourth part of the duodenum but we                            could not reach the obstruction with the scope. The                            [id number]) Olympus colonoscope was                            introduced through the and advanced to the                            obstruction. We then tried multiple times to                            advance the wire past the obstruction but were  unsuccessful so we advanced the [id number]                            [id number]) Olympus slim colonoscope was introduced                            through the and advanced to the obstruction. And at                            1 point we were able to advance the wire and the                            sphincterotome past the obstruction and dye was                            injected to confirm position and under fluoroscopy                            guidance we were able to remove the ultraslim colon                            Scope and we backloaded the wire over the regular                            colon scope but unfortunately as we advanced                            towards the obstruction the wire withdrew from the                            distal side of the obstruction and despite a                            prolonged effort we could not readvanced the wire                            and we elected to terminate the procedure at  this                            point and the small bowel enteroscopy was                            technically difficult and complex due to extrinsic                            compression. Successful completion of the procedure                            was aided by performing the maneuvers documented                            (below) in this report. The patient tolerated the  procedure well. Scope In: Scope Out: Findings:      Localized mild inflammation characterized by linear erosions was found       on the greater curvature of the stomach. From previous NG trauma      Diffuse dilated small bowel was found in the duodenal bulb, in the first       portion of the duodenum, in the second portion of the duodenum, in the       third portion of the duodenum and in the fourth portion of the duodenum.      An extrinsic stenosis that was non-traversed was found in the proximal       jejunum. Estimated blood loss: None. A guide wire was inserted into the       jejunum and the endoscope was removed. The colonoscope was advanced over       the guide wire into the duodenum. Placement was confirmed by X-ray.       Unfortunately the wire was accidentally withdrawn back into the duodenum       and could not be readvanced past the obstruction estimated blood loss:       none. Impression:               - Caustic gastritis.                           - Dilated duodenum was found in the duodenum.                           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 Recommendation:           - NPO indefinitely. Replace NG tube as needed will                            allow him to have at least a few hours without  it                           - [facility] the small bowel [facility]oscopy at a                            [facility] if he would like 1 more try and using                            the side-viewing ERCP scope may be worthwhile.                           -  Return to GI clinic PRN.                           - Telephone GI clinic if symptomatic PRN.                           - Refer to an interventional radiologist at                            appointment to be scheduled if PEG placement is                            desired. Procedure Code(s):        --- Professional ---                           [phone], Small intestinal endoscopy, enteroscopy                            beyond second portion of duodenum, not including                            ileum; diagnostic, including collection of                            specimen(s) by brushing or washing, when performed                            (separate procedure)                           [id number], Unlisted procedure, small intestine Diagnosis Code(s):        --- Professional ---                           T54.94XA, Toxic effect of unspecified corrosive                            substance, undetermined, initial encounter                           T28.7XXA, Corrosion of other parts of alimentary                            tract, initial encounter                           K31.89, Other diseases of stomach and duodenum                           K56.699, Other intestinal obstruction unspecified                            as to partial versus complete obstruction                           K31.5, Obstruction  of duodenum                           R93.3, Abnormal findings on diagnostic imaging of                            other parts of digestive tract CPT copyright 2019 [employer]. All rights reserved. The codes documented in this report are preliminary and upon coder review may  be revised to meet current compliance requirements. Clarene Essex, MD 11/23/2020 3:38:03 PM This report has been signed electronically. Number of Addenda: 0

## 2020-11-23 NOTE — Anesthesia Postprocedure Evaluation (Signed)
Anesthesia Post Note  Patient: Frank Garrett  Procedure(s) Performed: ENTEROSCOPY     Patient location during evaluation: PACU Anesthesia Type: General Level of consciousness: awake and alert, oriented and patient cooperative Pain management: pain level controlled Vital Signs Assessment: post-procedure vital signs reviewed and stable Respiratory status: spontaneous breathing, nonlabored ventilation and respiratory function stable Cardiovascular status: blood pressure returned to baseline and stable Postop Assessment: no apparent nausea or vomiting Anesthetic complications: no   No notable events documented.  Last Vitals:  Vitals:   11/23/20 1530 11/23/20 1540  BP: 118/70 (!) 99/54  Pulse: 72 65  Resp: 15 13  Temp:    SpO2: 99% 100%    Last Pain:  Vitals:   11/23/20 1524  TempSrc: Oral  PainSc: 0-No pain                 Pervis Hocking

## 2020-11-24 LAB — COMPREHENSIVE METABOLIC PANEL
ALT: 19 U/L (ref 0–44)
AST: 18 U/L (ref 15–41)
Albumin: 2.9 g/dL — ABNORMAL LOW (ref 3.5–5.0)
Alkaline Phosphatase: 127 U/L — ABNORMAL HIGH (ref 38–126)
Anion gap: 5 (ref 5–15)
BUN: 21 mg/dL — ABNORMAL HIGH (ref 6–20)
CO2: 30 mmol/L (ref 22–32)
Calcium: 9.4 mg/dL (ref 8.9–10.3)
Chloride: 104 mmol/L (ref 98–111)
Creatinine, Ser: 0.93 mg/dL (ref 0.61–1.24)
GFR, Estimated: >60 mL/min (ref >60)
Glucose, Bld: 107 mg/dL — ABNORMAL HIGH (ref 70–99)
Potassium: 4.7 mmol/L (ref 3.5–5.1)
Sodium: 139 mmol/L (ref 135–145)
Total Bilirubin: 0.5 mg/dL (ref 0.3–1.2)
Total Protein: 6.8 g/dL (ref 6.5–8.1)

## 2020-11-24 LAB — PHOSPHORUS: Phosphorus: 3.4 mg/dL (ref 2.5–4.6)

## 2020-11-24 LAB — GLUCOSE, CAPILLARY
Glucose-Capillary: 122 mg/dL — ABNORMAL HIGH (ref 70–99)
Glucose-Capillary: 128 mg/dL — ABNORMAL HIGH (ref 70–99)
Glucose-Capillary: 164 mg/dL — ABNORMAL HIGH (ref 70–99)

## 2020-11-24 LAB — MAGNESIUM: Magnesium: 2 mg/dL (ref 1.7–2.4)

## 2020-11-24 MED ORDER — DEXTROSE 10 % IV SOLN: INTRAVENOUS | Status: AC

## 2020-11-24 MED ORDER — ENOXAPARIN SODIUM 60 MG/0.6ML IJ SOSY
60.0000 mg | PREFILLED_SYRINGE | Freq: Two times a day (BID) | INTRAMUSCULAR | Status: DC
  Administered 2020-11-24 – 2020-11-29 (×9): 60 mg via SUBCUTANEOUS
  Filled 2020-11-24 (×9): qty 0.6

## 2020-11-24 MED ORDER — TRAVASOL 10 % IV SOLN
INTRAVENOUS | Status: AC
  Filled 2020-11-24: qty 960

## 2020-11-24 NOTE — Progress Notes (Signed)
Nutrition Follow-up  DOCUMENTATION CODES:   Not applicable  INTERVENTION:  - regain central access - TPN per Pharmacist. - weigh patient today. - complete NFPE when feasible.    NUTRITION DIAGNOSIS:   Increased nutrient needs related to cancer and cancer related treatments as evidenced by estimated needs. - ongoing  GOAL:   Patient will meet greater than or equal to 90% of their needs -to be met with TPN regimen  MONITOR:   Labs, Weight trends, I & O's (TPN)  ASSESSMENT:   60 year old male with a history of metastatic pancreatic cancer, admitted to the hospital with intractable nausea and vomiting.  He was found to have small bowel obstruction and was admitted for further management.  He had NG tube placed for decompression.  Diet advanced to CLD on 9/7 at 1103 and changed back to NPO on 9/8 at 1312. He has remains NPO since that time.   Patient with R chest port which became dislodged yesterday evening. No central access at this time.   NGT placed in R nare on 9/4 and was replaced on 9/13. Noted to have 500 ml out in 24 hours and has had ~200 ml output in the past 3 hours.   Patient sleeping soundly at the time of visit. Patient's wife at bedside.   He has been receiving TPN at goal rate of 80 ml/hr which provides 1939 kcal and 96 grams protein.   Reviewed Surgery team's note from this AM. Wife re-iterates to RD hope to be transferred to [place] as this is where he has been receiving Oncology care.   He has not been weighed since 9/4. No information documented in the edema section of flow sheet. He is noted to be +1.95 L since admission.   Labs reviewed; CBG: 128 mg/dl, BUN: 21 mg/dl, Alk Phos elevated.  Medications reviewed; sliding scale novolog. IVF; D10 @ 80 ml/hr while TPN on hold (652 kcal/24 hrs).     NUTRITION - FOCUSED PHYSICAL EXAM:  Unable to complete.   Diet Order:   Diet Order             Diet NPO time specified  Diet  effective midnight                   EDUCATION NEEDS:   Education needs have been addressed  Skin:  Skin Assessment: Reviewed RN Assessment  Last BM:  9/13 (type 7 x2)  Height:   Ht Readings from Last 1 Encounters:  11/14/20 5' 8"  (1.727 m)    Weight:   Wt Readings from Last 1 Encounters:  11/14/20 57.7 kg     Estimated Nutritional Needs:  Kcal:  1800-2000 Protein:  90-105g Fluid:  2L/day      Frank Matin, MS, RD, LDN, CNSC Inpatient Clinical Dietitian RD pager # available in AMION  After hours/weekend pager # available in [facility]

## 2020-11-24 NOTE — Progress Notes (Addendum)
[facility] Progress Note  Rondle Sehgal 60 y.o. 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   Subjective: Patient had enteroscopy with unsuccessful stenting of jejunal stenosis yesterday. Patient with continued NG tube placement with 500 cc daily total.  Denies AB pain, nausea, vomiting.  No fever, chills, shortness of breath or chest pain.   ROS : Review of Systems  Constitutional:  Negative for chills and fever.  Respiratory:  Negative for shortness of breath.   Cardiovascular:  Negative for chest pain and leg swelling.  Gastrointestinal:  Positive for diarrhea (loose stools normal for patient x 5 years). Negative for abdominal pain, blood in stool, constipation, nausea and vomiting.  Musculoskeletal:  Negative for falls.  Neurological:  Negative for focal weakness.  Psychiatric/Behavioral:  Negative for memory loss. The patient is not nervous/anxious.     Objective: Vital signs in last 24 hours: Vitals:   11/23/20 2006 11/24/20 0608  BP: 108/71 92/65  Pulse: 62 (!) 56  Resp: 18 15  Temp: 98.4 F (36.9 C) 98.8 F (37.1 C)  SpO2: 100% 99%    Physical Exam: Physical Exam Constitutional:      General: He is not in acute distress.    Appearance: He is cachectic.     Comments: NGT in place  Eyes:     General: No scleral icterus.    Conjunctiva/sclera: Conjunctivae normal.  Cardiovascular:     Rate and Rhythm: Normal rate and regular rhythm.  Pulmonary:     Effort: Pulmonary effort is normal. No respiratory distress.  Abdominal:     General: There is no distension.     Palpations: Abdomen is soft.     Tenderness: There is no abdominal tenderness. There is no guarding or rebound.  Musculoskeletal:        General: Normal range of motion.  Skin:    Coloration: Skin is not jaundiced.  Neurological:     General: No focal deficit present.     Mental Status: He is oriented to  person, place, and time.  Psychiatric:        Mood and Affect: Mood normal.        Behavior: Behavior normal.     Lab Results: Recent Labs    11/23/20 0723 11/24/20 0352  NA 141 139  K 4.7 4.7  CL 105 104  CO2 30 30  GLUCOSE 114* 107*  BUN 22* 21*  CREATININE 0.74 0.93  CALCIUM 9.6 9.4  MG 2.2 2.0  PHOS 3.5 3.4    Recent Labs    11/23/20 0723 11/24/20 0352  AST 17 18  ALT 21 19  ALKPHOS 136* 127*  BILITOT 0.3 0.5  PROT 6.8 6.8  ALBUMIN 3.1* 2.9*    Recent Labs    11/23/20 0722  WBC 8.3  NEUTROABS 4.5  HGB 8.8*  HCT 28.3*  MCV 95.0  PLT 405*   No results for input(s): LABPROT, INR in the last 72 hours.  Lab Results: Results for orders placed or performed during the hospital encounter of 11/14/20 (from the past 48 hour(s))  Glucose, capillary     Status: Abnormal   Collection Time: 11/22/20  5:21 PM  Result Value Ref Range   Glucose-Capillary 123 (H) 70 - 99 mg/dL    Comment: Glucose reference range applies only to samples taken after fasting for at least 8 hours.  Glucose, capillary     Status: Abnormal   Collection Time: 11/23/20  12:17 AM  Result Value Ref Range   Glucose-Capillary 127 (H) 70 - 99 mg/dL    Comment: Glucose reference range applies only to samples taken after fasting for at least 8 hours.  CBC with Differential/Platelet     Status: Abnormal   Collection Time: 11/23/20  7:22 AM  Result Value Ref Range   WBC 8.3 4.0 - 10.5 K/uL   RBC 2.98 (L) 4.22 - 5.81 MIL/uL   Hemoglobin 8.8 (L) 13.0 - 17.0 g/dL   HCT 28.3 (L) 39.0 - 52.0 %   MCV 95.0 80.0 - 100.0 fL   MCH 29.5 26.0 - 34.0 pg   MCHC 31.1 30.0 - 36.0 g/dL   RDW 17.9 (H) 11.5 - 15.5 %   Platelets 405 (H) 150 - 400 K/uL   nRBC 0.6 (H) 0.0 - 0.2 %   Neutrophils Relative % 52 %   Neutro Abs 4.5 1.7 - 7.7 K/uL   Lymphocytes Relative 21 %   Lymphs Abs 1.7 0.7 - 4.0 K/uL   Monocytes Relative 18 %   Monocytes Absolute 1.5 (H) 0.1 - 1.0 K/uL   Eosinophils Relative 1 %   Eosinophils  Absolute 0.1 0.0 - 0.5 K/uL   Basophils Relative 1 %   Basophils Absolute 0.1 0.0 - 0.1 K/uL   WBC Morphology MILD LEFT SHIFT (1-5% METAS, OCC MYELO, OCC BANDS)    Immature Granulocytes 7 %   Abs Immature Granulocytes 0.56 (H) 0.00 - 0.07 K/uL   Tammy Sours Bodies PRESENT    Polychromasia PRESENT    Target Cells PRESENT     Comment: Performed at [facility], [place] [address] [place], [place] [address]  Comprehensive metabolic panel     Status: Abnormal   Collection Time: 11/23/20  7:23 AM  Result Value Ref Range   Sodium 141 135 - 145 mmol/L   Potassium 4.7 3.5 - 5.1 mmol/L   Chloride 105 98 - 111 mmol/L   CO2 30 22 - 32 mmol/L   Glucose, Bld 114 (H) 70 - 99 mg/dL    Comment: Glucose reference range applies only to samples taken after fasting for at least 8 hours.   BUN 22 (H) 6 - 20 mg/dL   Creatinine, Ser 0.74 0.61 - 1.24 mg/dL   Calcium 9.6 8.9 - 10.3 mg/dL   Total Protein 6.8 6.5 - 8.1 g/dL   Albumin 3.1 (L) 3.5 - 5.0 g/dL   AST 17 15 - 41 U/L   ALT 21 0 - 44 U/L   Alkaline Phosphatase 136 (H) 38 - 126 U/L   Total Bilirubin 0.3 0.3 - 1.2 mg/dL   GFR, Estimated >60 >60 mL/min    Comment: (NOTE) Calculated using the CKD-EPI Creatinine Equation (2021)    Anion gap 6 5 - 15    Comment: Performed at [facility], [place] [address] [place], [place] [address]  Magnesium     Status: None   Collection Time: 11/23/20  7:23 AM  Result Value Ref Range   Magnesium 2.2 1.7 - 2.4 mg/dL    Comment: Performed at [facility], [place] [address], [place], [place] [address]  Phosphorus     Status: None   Collection Time: 11/23/20  7:23 AM  Result Value Ref Range   Phosphorus 3.5 2.5 - 4.6 mg/dL    Comment: Performed at [facility], [place] [address], [place], [place] [address]  Glucose, capillary     Status: Abnormal   Collection  Time: 11/23/20  8:00 AM  Result Value Ref Range   Glucose-Capillary 120 (H) 70 - 99  mg/dL    Comment: Glucose reference range applies only to samples taken after fasting for at least 8 hours.  Glucose, capillary     Status: Abnormal   Collection Time: 11/23/20  5:06 PM  Result Value Ref Range   Glucose-Capillary 155 (H) 70 - 99 mg/dL    Comment: Glucose reference range applies only to samples taken after fasting for at least 8 hours.  Glucose, capillary     Status: Abnormal   Collection Time: 11/23/20 11:24 PM  Result Value Ref Range   Glucose-Capillary 172 (H) 70 - 99 mg/dL    Comment: Glucose reference range applies only to samples taken after fasting for at least 8 hours.  Comprehensive metabolic panel     Status: Abnormal   Collection Time: 11/24/20  3:52 AM  Result Value Ref Range   Sodium 139 135 - 145 mmol/L   Potassium 4.7 3.5 - 5.1 mmol/L   Chloride 104 98 - 111 mmol/L   CO2 30 22 - 32 mmol/L   Glucose, Bld 107 (H) 70 - 99 mg/dL    Comment: Glucose reference range applies only to samples taken after fasting for at least 8 hours.   BUN 21 (H) 6 - 20 mg/dL   Creatinine, Ser 0.93 0.61 - 1.24 mg/dL   Calcium 9.4 8.9 - 10.3 mg/dL   Total Protein 6.8 6.5 - 8.1 g/dL   Albumin 2.9 (L) 3.5 - 5.0 g/dL   AST 18 15 - 41 U/L   ALT 19 0 - 44 U/L   Alkaline Phosphatase 127 (H) 38 - 126 U/L   Total Bilirubin 0.5 0.3 - 1.2 mg/dL   GFR, Estimated >60 >60 mL/min    Comment: (NOTE) Calculated using the CKD-EPI Creatinine Equation (2021)    Anion gap 5 5 - 15    Comment: Performed at [facility], [place] [address] [place], [place] [address]  Magnesium     Status: None   Collection Time: 11/24/20  3:52 AM  Result Value Ref Range   Magnesium 2.0 1.7 - 2.4 mg/dL    Comment: Performed at [facility], [place] [address] [place], [place] [address]  Phosphorus     Status: None   Collection Time: 11/24/20  3:52 AM  Result Value Ref Range   Phosphorus 3.4 2.5 - 4.6 mg/dL    Comment: Performed at [facility], [place]  [address], [place], [place] [address]  Glucose, capillary     Status: Abnormal   Collection Time: 11/24/20  7:40 AM  Result Value Ref Range   Glucose-Capillary 128 (H) 70 - 99 mg/dL    Comment: Glucose reference range applies only to samples taken after fasting for at least 8 hours.    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 Unsuccessful stenting of jejunal obstruction -History of pulmonary embolism -currently on full dose Lovenox  Plan: Called [facility] # [phone], discussed with PA Frederik Schmidt , oncology PA that works with Dr. Kaleen Mask primary oncologist.  Last cycle of chemotherapy was August 26 repeat in 2 weeks however patient was admitted here with SBO Previous surgeon 2018 with Dr. Chrissie Noa who is no longer with [facility].  With speaking with the PA at this time they do not have a surgical  service there  and suggest surgical assessment here for the patient if felt necessary.  States patient is palliative treatment at this time.  Discussed further with family, if they are unable to be transferred to [place], given 2 options of  jejunostomy and venting gastromy placed here when surgical team when they are able or  go to a tertiary center for second pass enteroscopy with attempted stent placement. They understand the stent is temporary, and the chances with the stenosis are low but at this time they prefer to avoid PEG placement if at all possible.  If patient chooses to go to tertiary center, will try to secure outpatient date for enteroscopy with stenting. She will have to be discharged n.p.o. with TPN with PICC line.  Ultimately patient will need jejunostomy and venting gastromy if stent placement fails.  Prognosis is poor, discussed palliative and hospice options with the family but they are resistant at this time.      Vladimir Crofts  PA-C 11/24/2020, 1:54 PM

## 2020-11-24 NOTE — Progress Notes (Addendum)
Patient informed nurse that PAC was leaking, when assessing the PAC dressing, patient's needle was dislodged from [facility]. TPN fluids held, orders placed for IV team to reaccess PAC. Pharmacy also called and informed, orders placed to run d10 @ 80 ml/hr.   0335: physician also paged and informed.

## 2020-11-24 NOTE — Progress Notes (Signed)
PROGRESS NOTE  [facility]  [id number] DOB: Feb 14, 1961 DOA: 11/14/2020 PCP: Tobie Lords D, FNP   Brief Narrative: Frank Garrett is a 60 y.o. male with a history of pancreatic CA s/p [place] 2018 and chemotherapy, breakthrough covid-19 infection Jan 2022 who presented to the ED 9/4 with nausea, vomiting due to small bowel obstruction which is felt to be due to adhesions from surgery vs. neoplastic invasion. Symptoms have improved dramatically with NG Tube, NPO status, and TPN has been started. GI attempt at endoscopic placement of stent at duodenum-jejunum junction on 9/13 was unsuccessful. the patient and wife have requested transfer to [facility]. I have initiated this transfer, have yet to hear from hospitalist.   Assessment & Plan: Principal Problem:   SBO (small bowel obstruction) ([place]) Active Problems:   Nausea and vomiting   Anemia of chronic disease   Asplenia   Pancreas cancer (HCC)   Chronic anticoagulation   Liver metastasis from pancreas cancer   Neutropenia (HCC)   On antineoplastic chemotherapy   Pressure injury of skin  Partial SBO: Unsuccessful attempt at stenting of duodenal-gastrojejunal stricture on 9/13.  - Continue TPN, started 9/10. K trending up, LFTs stable, insulin dosing appears good. - Continue NPO, NGT. Output from NG remains significant.   Positive covid-19 PCR: on 9/4. With cycle threshold of 33.6 and no attributable symptoms, this could be seen as a persistent (false) positive in an immunocompromised patient who had covid earlier this year.  - Isolation has been discontinued and the patient has remained asymptomatic from this perspective requiring no treatment.   History of PE (Aug 2022): No current hypoxia or evidence of RV overload.  - Restarted anticoagulation post-EGD.    Metastatic pancreatic CA:  - Continue care through [facility].   AKI, hypernatremia: Resolved.  Neutropenia, anemia: Polychromasia present. Had some schistocytes  previously, none seen 9/13 and hgb stable at 8.8 g/dl. WBC has normalized.  RN Pressure Injury Documentation: Pressure Injury 11/22/20 Sacrum Lower Stage 2 -  Partial thickness loss of dermis presenting as a shallow open injury with a red, pink wound bed without slough. Pink, no drainage. Surrounding skin in tact. (Active)  11/22/20 0829  Location: Sacrum  Location Orientation: Lower  Staging: Stage 2 -  Partial thickness loss of dermis presenting as a shallow open injury with a red, pink wound bed without slough.  Wound Description (Comments): Pink, no drainage. Surrounding skin in tact.  Present on Admission: Yes   DVT prophylaxis: Therapeutic lovenox restarted Code Status: Full Family Communication: Family at bedside Disposition Plan:  Status is: Inpatient  Remains inpatient appropriate because:IV treatments appropriate due to intensity of illness or inability to take PO  Dispo: The patient is from: Home              Anticipated d/c is to: Attempting transfer to [facility] in [place].              Patient currently is not medically stable to d/c.   Difficult to place patient No  Consultants:  General surgery GI Palliative care  Procedures:  Small bowel enteroscopy 11/23/2020 Dr. Clarene Essex:  Findings:      Localized mild inflammation characterized by linear erosions was found       on the greater curvature of the stomach. From previous NG trauma      Diffuse dilated small bowel was found in the duodenal bulb, in the first       portion of the duodenum, in the  second portion of the duodenum, in the       third portion of the duodenum and in the fourth portion of the duodenum.      An extrinsic stenosis that was non-traversed was found in the proximal       jejunum. Estimated blood loss: None. A guide wire was inserted into the       jejunum and the endoscope was removed. The colonoscope was advanced over       the guide wire into the duodenum.  Placement was confirmed by X-ray.       Unfortunately the wire was accidentally withdrawn back into the duodenum       and could not be readvanced past the obstruction estimated blood loss:       none. Impression:       - Caustic gastritis.                           - Dilated duodenum was found in the duodenum.                           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 Recommendation: - NPO indefinitely. Replace NG tube as needed will                            allow him to have at least a few hours without it                           - Repeat the small bowel enteroscopy at a                            university if he would like 1 more try and using                            the side-viewing ERCP scope may be worthwhile.                           - Return to GI clinic PRN.                           - Telephone GI clinic if symptomatic PRN.                           - Refer to an interventional radiologist at  appointment to be scheduled if PEG placement is                            desired.   Antimicrobials: None   Subjective: No new complaints. Continues to have control of pain and nausea as long as NG tube is in. Replacement was delayed after procedure yesterday and per report abdomen soon became distended. Port access became dislodged and was replaced 9/13.  Objective: Vitals:   11/23/20 1540 11/23/20 1550 11/23/20 2006 11/24/20 0608  BP: (!) 99/54 120/72 108/71 92/65  Pulse: 65 63 62 (!) 56  Resp: '13 11 18 15  '$ Temp:   98.4 F (36.9 C) 98.8 F (37.1 C)  TempSrc:   Oral Oral  SpO2: 100% 100% 100% 99%  Weight:      Height:        Intake/Output Summary (Last 24 hours) at  11/24/2020 1331 Last data filed at 11/24/2020 1232 Gross per 24 hour  Intake 3902.6 ml  Output 1682 ml  Net 2220.6 ml   Filed Weights   11/14/20 1839  Weight: 57.7 kg   Gen: 60 y.o. male in no distress Pulm: Nonlabored breathing room air. Clear. CV: Regular rate and rhythm. No murmur, rub, or gallop. No JVD, no dependent edema. GI: Abdomen soft, non-tender, non-distended, with normoactive bowel sounds.  Ext: Warm, no deformities Skin: No new rashes, lesions or ulcers on visualized skin. Port site in left upper chest remains c/d/i Neuro: Alert and oriented. No focal neurological deficits. Psych: Judgement and insight appear fair. Mood euthymic & affect congruent. Behavior is appropriate.    Data Reviewed: I have personally reviewed following labs and imaging studies  CBC: Recent Labs  Lab 11/18/20 0454 11/19/20 0430 11/23/20 0722  WBC 3.1* 3.8* 8.3  NEUTROABS 0.8* 1.4* 4.5  HGB 9.2* 8.8* 8.8*  HCT 30.2* 28.9* 28.3*  MCV 95.6 95.7 95.0  PLT 298 357 [id number]*   Basic Metabolic Panel: Recent Labs  Lab 11/20/20 0607 11/21/20 0441 11/22/20 0331 11/23/20 0723 11/24/20 0352  NA 139 138 140 141 139  K 4.6 4.8 4.3 4.7 4.7  CL 103 101 102 105 104  CO2 '26 26 29 30 30  '$ GLUCOSE 126* 119* 104* 114* 107*  BUN '14 15 18 '$ 22* 21*  CREATININE 1.07 1.13 0.99 0.74 0.93  CALCIUM 9.3 9.3 9.5 9.6 9.4  MG 2.2 2.1 2.0 2.2 2.0  PHOS 2.6 3.9 4.1 3.5 3.4   GFR: Estimated Creatinine Clearance: 70.7 mL/min (by C-G formula based on SCr of 0.93 mg/dL). Liver Function Tests: Recent Labs  Lab 11/20/20 0607 11/21/20 0441 11/22/20 0331 11/23/20 0723 11/24/20 0352  AST '21 21 18 17 18  '$ ALT 34 '29 24 21 19  '$ ALKPHOS 163* 153* 136* 136* 127*  BILITOT 0.7 0.5 0.6 0.3 0.5  PROT 7.1 7.1 6.7 6.8 6.8  ALBUMIN 3.1* 3.2* 3.0* 3.1* 2.9*   No results for input(s): LIPASE, AMYLASE in the last 168 hours. No results for input(s): AMMONIA in the last 168 hours. Coagulation Profile: No results for input(s):  INR, PROTIME in the last 168 hours. Cardiac Enzymes: No results for input(s): CKTOTAL, CKMB, CKMBINDEX, TROPONINI in the last 168 hours. BNP (last 3 results) No results for input(s): PROBNP in the last 8760 hours. HbA1C: No results for input(s): HGBA1C in the last 72 hours. CBG: Recent Labs  Lab 11/23/20 0017 11/23/20 0800 11/23/20 1706 11/23/20 2324 11/24/20 0740  GLUCAP 127* 120* 155* 172*  128*   Lipid Profile: Recent Labs    11/22/20 0331  TRIG 68   Thyroid Function Tests: No results for input(s): TSH, T4TOTAL, FREET4, T3FREE, THYROIDAB in the last 72 hours. Anemia Panel: No results for input(s): VITAMINB12, FOLATE, FERRITIN, TIBC, IRON, RETICCTPCT in the last 72 hours. Urine analysis:    Component Value Date/Time   COLORURINE YELLOW (A) 11/14/2020 1650   APPEARANCEUR CLEAR (A) 11/14/2020 1650   LABSPEC 1.020 11/14/2020 1650   PHURINE 5.5 11/14/2020 1650   GLUCOSEU NEGATIVE 11/14/2020 1650   HGBUR NEGATIVE 11/14/2020 1650   BILIRUBINUR SMALL (A) 11/14/2020 1650   KETONESUR NEGATIVE 11/14/2020 1650   PROTEINUR TRACE (A) 11/14/2020 1650   UROBILINOGEN 2.0 (H) 08/20/2006 1249   NITRITE NEGATIVE 11/14/2020 1650   LEUKOCYTESUR NEGATIVE 11/14/2020 1650   No results found for this or any previous visit (from the past 240 hour(s)).     Radiology Studies: DG Abd 1 View  Result Date: 11/23/2020 CLINICAL DATA:  NG tube placement EXAM: ABDOMEN - 1 VIEW COMPARISON:  11/19/2020 FINDINGS: NG tube is in the stomach.  Nonobstructive bowel gas pattern. IMPRESSION: NG tube in the stomach. Electronically Signed   By: Rolm Baptise M.D.   On: 11/23/2020 17:13   DG C-Arm 1-60 Min  Result Date: 11/23/2020 CLINICAL DATA:  Endoscopy and stent placement EXAM: DG C-ARM 1-60 MIN FLUOROSCOPY TIME:  Fluoroscopy Time:  8 minutes 47 seconds Radiation Exposure Index (if provided by the fluoroscopic device): 91.41 mGy Number of Acquired Spot Images: 13 COMPARISON:  11/18/2020 FINDINGS: Thirteen  fluoroscopic images are obtained during the performance of the procedure and are provided for interpretation only. And scope overlies dilated gas-filled loops of bowel within the mid abdomen. Contrast is seen outlining the loops of bowel. Please refer to operative report. IMPRESSION: 1. Intraoperative evaluation as above. Please refer to the operative report. Electronically Signed   By: Randa Ngo M.D.   On: 11/23/2020 16:20    Scheduled Meds:  bisacodyl  10 mg Rectal Daily   Chlorhexidine Gluconate Cloth  6 each Topical Daily   enoxaparin (LOVENOX) injection  60 mg Subcutaneous Q12H   insulin aspart  0-9 Units Subcutaneous Q8H   lip balm  1 application Topical BID   sodium chloride flush  10-40 mL Intracatheter Q12H   Continuous Infusions:  sodium chloride     dextrose 80 mL/hr at 11/24/20 1232   dextrose 5 % and 0.45% NaCl 10 mL/hr at 11/22/20 1902   lactated ringers Stopped (11/23/20 1526)   methocarbamol (ROBAXIN) IV 1,000 mg (11/17/20 1313)   ondansetron (ZOFRAN) IV     TPN ADULT (ION)       LOS: 10 days   Time spent: 35 minutes.  Patrecia Pour, MD [facility] [email or web address] 11/24/2020, 1:31 PM

## 2020-11-24 NOTE — Evaluation (Signed)
Occupational Therapy Evaluation Patient Details Name: Frank Garrett MRN: [id number] DOB: 14-Aug-1960 Today's Date: 11/24/2020   History of Present Illness 60 year old male with a history of metastatic pancreatic cancer, admitted to the hospital with intractable nausea and vomiting.  He was found to have small bowel obstruction and was admitted for further management.   Clinical Impression   Mr. Braxtyn Crilley is a 60 year old man who presents today with NG tube. On evaluation he demonstrates normal ROM and strength of upper extremities, demonstrates ability to perform lower body dressing and stand without loss of balance. Does not lose balance with anterior nudge and able to march in place. No complaints of weakness. Patient able to perform independent functional mobility and ADLs in room. Patient has shower chair and toilet riser if needed at home. No OT needs at this time.       Recommendations for follow up therapy are one component of a multi-disciplinary discharge planning process, led by the attending physician.  Recommendations may be updated based on patient status, additional functional criteria and insurance authorization.   Follow Up Recommendations  No OT follow up    Equipment Recommendations  None recommended by OT    Recommendations for Other Services       Precautions / Restrictions Precautions Precautions: Other (comment) Precaution Comments: NG tube Restrictions Weight Bearing Restrictions: No      Mobility Bed Mobility Overal bed mobility: Independent                  Transfers Overall transfer level: Independent                    Balance Overall balance assessment: No apparent balance deficits (not formally assessed)                                         ADL either performed or assessed with clinical judgement   ADL Overall ADL's : Modified independent                                       General  ADL Comments: Limited by presence of NGT and hospital environment but otherwise independent     Vision Patient Visual Report: No change from baseline       Perception     Praxis      Pertinent Vitals/Pain Pain Assessment: No/denies pain     Hand Dominance     Extremity/Trunk Assessment Upper Extremity Assessment Upper Extremity Assessment: Overall WFL for tasks assessed (WNL ROM, 5/5 strength)   Lower Extremity Assessment Lower Extremity Assessment: Defer to PT evaluation   Cervical / Trunk Assessment Cervical / Trunk Assessment: Normal   Communication Communication Communication: No difficulties   Cognition Arousal/Alertness: Awake/alert Behavior During Therapy: WFL for tasks assessed/performed Overall Cognitive Status: Within Functional Limits for tasks assessed                                     General Comments       Exercises     Shoulder Instructions      Home Living Family/patient expects to be discharged to:: Private residence Living Arrangements: Spouse/significant other Available Help at Discharge: Family Type of Home: House Home Access:  Stairs to enter [employer] of Steps: 2 (from garage)   Home Layout: Two level Alternate Level Stairs-Number of Steps: 14   Bathroom Shower/Tub: Occupational psychologist: Standard     Home Equipment: Toilet riser;Shower seat          Prior Functioning/Environment Level of Independence: Independent                 OT Problem List:        OT Treatment/Interventions:      OT Goals(Current goals can be found in the care plan section) Acute Rehab OT Goals OT Goal Formulation: All assessment and education complete, DC therapy  OT Frequency:     Barriers to D/C:            Co-evaluation              AM-PAC OT "6 Clicks" Daily Activity     Outcome Measure Help from another person eating meals?: Total (NPO) Help from another person taking care of  personal grooming?: None Help from another person toileting, which includes using toliet, bedpan, or urinal?: None Help from another person bathing (including washing, rinsing, drying)?: None Help from another person to put on and taking off regular upper body clothing?: None Help from another person to put on and taking off regular lower body clothing?: None 6 Click Score: 21   End of Session Nurse Communication: Other (comment) (okay to see)  Activity Tolerance: Patient tolerated treatment well Patient left: in bed;with call bell/phone within reach;with family/visitor present  OT Visit Diagnosis: Pain                Time: [id number] OT Time Calculation (min): 6 min Charges:  OT General Charges $OT Visit: 1 Visit OT Evaluation $OT Eval Low Complexity: 1 Low  Chanz Cahall, OTR/L [place]  Office [phone] Pager: [phone]    Lenward Chancellor 11/24/2020, 9:23 AM

## 2020-11-24 NOTE — Progress Notes (Signed)
PT Cancellation Note  Patient Details Name: Frank Garrett MRN: [id number] DOB: 09-02-1960   Cancelled Treatment:    Reason Eval/Treat Not Completed: PT screened, no needs identified, will sign off (OT and RN report pt independent with all functional mobility and no skileld Acute PT needs. Will sign off. Please reconsult if there is a change in functional status.)  Verner Mould, DPT Acute Rehabilitation Services Office [phone] Pager [phone]

## 2020-11-24 NOTE — Progress Notes (Signed)
Progress Note  1 Day Post-Op  Subjective: Denies abdominal pain, passing a small amount of flatus.  Still with over 500cc of NGT output in last 24hrs.  Unable to place stent yesterday with GI  Objective: Vital signs in last 24 hours: Temp:  [97.8 F (36.6 C)-98.8 F (37.1 C)] 98.8 F (37.1 C) (09/14 0608) Pulse Rate:  [56-72] 56 (09/14 0608) Resp:  [11-18] 15 (09/14 0608) BP: (92-120)/(53-72) 92/65 (09/14 [id number]) SpO2:  [99 %-100 %] 99 % (09/14 0608) Last BM Date: 11/23/20  Intake/Output from previous day: 09/13 0701 - 09/14 0700 In: 3012.7 [I.V.:3012.7] Out: 1000 [Urine:400; Emesis/NG output:600] Intake/Output this shift: No intake/output data recorded.  PE: General appearance: alert, cooperative, no distress GI: abdomen soft, nontender, NG is draining green bilious fluid   Lab Results:  Recent Labs    11/23/20 0722  WBC 8.3  HGB 8.8*  HCT 28.3*  PLT 405*   BMET Recent Labs    11/23/20 0723 11/24/20 0352  NA 141 139  K 4.7 4.7  CL 105 104  CO2 30 30  GLUCOSE 114* 107*  BUN 22* 21*  CREATININE 0.74 0.93  CALCIUM 9.6 9.4   PT/INR No results for input(s): LABPROT, INR in the last 72 hours. CMP     Component Value Date/Time   NA 139 11/24/2020 0352   K 4.7 11/24/2020 0352   CL 104 11/24/2020 0352   CO2 30 11/24/2020 0352   GLUCOSE 107 (H) 11/24/2020 0352   BUN 21 (H) 11/24/2020 0352   CREATININE 0.93 11/24/2020 0352   CALCIUM 9.4 11/24/2020 0352   PROT 6.8 11/24/2020 0352   ALBUMIN 2.9 (L) 11/24/2020 0352   AST 18 11/24/2020 0352   ALT 19 11/24/2020 0352   ALKPHOS 127 (H) 11/24/2020 0352   BILITOT 0.5 11/24/2020 0352   GFRNONAA >60 11/24/2020 0352   GFRAA >60 11/11/2016 0404   Lipase     Component Value Date/Time   LIPASE 207 (H) 11/14/2020 1152       Studies/Results: DG Abd 1 View  Result Date: 11/23/2020 CLINICAL DATA:  NG tube placement EXAM: ABDOMEN - 1 VIEW COMPARISON:  11/19/2020 FINDINGS: NG tube is in the stomach.   Nonobstructive bowel gas pattern. IMPRESSION: NG tube in the stomach. Electronically Signed   By: Rolm Baptise M.D.   On: 11/23/2020 17:13   DG C-Arm 1-60 Min  Result Date: 11/23/2020 CLINICAL DATA:  Endoscopy and stent placement EXAM: DG C-ARM 1-60 MIN FLUOROSCOPY TIME:  Fluoroscopy Time:  8 minutes 47 seconds Radiation Exposure Index (if provided by the fluoroscopic device): 91.41 mGy Number of Acquired Spot Images: 13 COMPARISON:  11/18/2020 FINDINGS: Thirteen fluoroscopic images are obtained during the performance of the procedure and are provided for interpretation only. And scope overlies dilated gas-filled loops of bowel within the mid abdomen. Contrast is seen outlining the loops of bowel. Please refer to operative report. IMPRESSION: 1. Intraoperative evaluation as above. Please refer to the operative report. Electronically Signed   By: Randa Ngo M.D.   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  Wife mentioned a repeat attempt, unclear if this is feasible or possible.  Will defer to GI - long discussion was had with patient, wife, and family present in the room this morning regarding potentially surgical options such as GJ bypass, G-tube and J-tube placement, and just g-tube placement.  We discussed the risks and complications associated with each of these procedures surgically, but also in regards to  his tumor burden as well as his nutritional status and expected outcomes.  After this discussion the wife informed me that she was speaking with his team at the [facility] and they (the facility as well as the family) would like for the patient to be transferred down there with the team that knows him.  I told them I thought this was a great idea.  I have relayed this to the primary service who will follow up with the information the wife has.     FEN: ice chips/sips, IVF per TRH, NGT to [facility]WS ID: None DVT: Lovenox     COVID-positive -asymptomatic, off precautions    Hypertension Chronic diarrhea GERD Dehydration - Cr 0.99 Leukopenia -  WBC 3.8 (9/9)   Paliative Care recommendations as of 11/17/20 Continue to treat the treatable When his wife brings [facility] POA paperwork and we will scanned this into the system Hold off on further goals of care discussion for now May need further [place] discussion if any significant clinical change PMT will continue to follow  LOS: 10 days    Henreitta Cea, [facility] 11/24/2020, 10:00 AM Please see Amion for pager number during day hours 7:00am-4:30pm

## 2020-11-24 NOTE — Progress Notes (Signed)
PHARMACY - TOTAL PARENTERAL NUTRITION CONSULT NOTE   Indication: Small bowel obstruction  Patient Measurements: Height: '5\' 8"'$  (172.7 cm) Weight: 57.7 kg (127 lb 3.3 oz) IBW/kg (Calculated) : 68.4 TPN AdjBW (KG): 57.7 Body mass index is 19.34 kg/m.  Assessment: 60 yo male with metastatic pancreatic cancer followed by [facility] [place] [facility] [place] now with continued SBO due to tumor to start TPN.   Glucose / Insulin: CBGs since TPN started at goal < 180: range 107-172 - 4 units SSI required/24hr Electrolytes: All WNL Renal: SCr stable 0.93, UOP not measured Hepatic: AST/ALT WNL, AlkPhos 127 TG: 85 (9/10), 68 (9/12)  Intake / Output; MIVF: I/O inaccurate, currently on D51/2NS 10 ml/hr - 872m NGT output, no BM recorded in past 24hr - previous loose stools noted GI Imaging: GI Surgeries / Procedures: plan for 9/13: per GI, duodenal/jejunal stent placement  Central access: patient already with Port TPN start date: 9/10 9/14-RN called at 0330 to report patient pulled TPN line and it was disconnected from portacath then discarded; D10 80 ml/hr IV entered.    Nutritional Goals: Goal TPN rate is 80 mL/hr (provides 96 g of protein and 1939 kcals per day)  RD Assessment: Estimated Needs Total Energy Estimated Needs: 1800-2000 Total Protein Estimated Needs: 90-105g Total Fluid Estimated Needs: 2L/day  Current Nutrition:  NPO  Plan:  Continue D10% at 80 ml/hr until 1800 Restart TPN at goal 80 ml/hr this evening at 1800 Electrolytes in TPN:  Na 743m/L K 3065mL Ca 5mE22m  Mg 5mEq60m Phos 15mmo55m Cl:Ac 1:1  Add standard MVI and trace elements to TPN Continue Sensitive q8h SSI and adjust as needed  [facility]/Thurs, and as needed   MelissRoyetta AsalmD, BCPS Clinical Pharmacist [place] Please utilize Amion for appropriate phone number to reach the unit pharmacist ([facility] [place]/2022 8:55 AM

## 2020-11-25 ENCOUNTER — Encounter (HOSPITAL_COMMUNITY): Payer: Self-pay | Admitting: Gastroenterology

## 2020-11-25 LAB — COMPREHENSIVE METABOLIC PANEL
ALT: 18 U/L (ref 0–44)
AST: 19 U/L (ref 15–41)
Albumin: 2.8 g/dL — ABNORMAL LOW (ref 3.5–5.0)
Alkaline Phosphatase: 124 U/L (ref 38–126)
Anion gap: 8 (ref 5–15)
BUN: 18 mg/dL (ref 6–20)
CO2: 30 mmol/L (ref 22–32)
Calcium: 9.1 mg/dL (ref 8.9–10.3)
Chloride: 98 mmol/L (ref 98–111)
Creatinine, Ser: 1.07 mg/dL (ref 0.61–1.24)
GFR, Estimated: >60 mL/min (ref >60)
Glucose, Bld: 152 mg/dL — ABNORMAL HIGH (ref 70–99)
Potassium: 3.9 mmol/L (ref 3.5–5.1)
Sodium: 136 mmol/L (ref 135–145)
Total Bilirubin: 0.4 mg/dL (ref 0.3–1.2)
Total Protein: 6.5 g/dL (ref 6.5–8.1)

## 2020-11-25 LAB — MAGNESIUM: Magnesium: 2.1 mg/dL (ref 1.7–2.4)

## 2020-11-25 LAB — PHOSPHORUS: Phosphorus: 4.3 mg/dL (ref 2.5–4.6)

## 2020-11-25 LAB — GLUCOSE, CAPILLARY
Glucose-Capillary: 125 mg/dL — ABNORMAL HIGH (ref 70–99)
Glucose-Capillary: 128 mg/dL — ABNORMAL HIGH (ref 70–99)
Glucose-Capillary: 141 mg/dL — ABNORMAL HIGH (ref 70–99)

## 2020-11-25 MED ORDER — SODIUM CHLORIDE 0.9 % IV SOLN: INTRAVENOUS | Status: DC

## 2020-11-25 MED ORDER — CEFAZOLIN SODIUM-DEXTROSE 2-4 GM/100ML-% IV SOLN: 2.0000 g | INTRAVENOUS | Status: AC

## 2020-11-25 MED ORDER — TRAVASOL 10 % IV SOLN
INTRAVENOUS | Status: AC
  Filled 2020-11-25: qty 960

## 2020-11-25 NOTE — Progress Notes (Signed)
PHARMACY - TOTAL PARENTERAL NUTRITION CONSULT NOTE   Indication: Small bowel obstruction  Patient Measurements: Height: '5\' 8"'$  (172.7 cm) Weight: 57.5 kg (126 lb 12.2 oz) IBW/kg (Calculated) : 68.4 TPN AdjBW (KG): 57.7 Body mass index is 19.27 kg/m.  Assessment: 60 yo male with metastatic pancreatic cancer followed by [facility] [place] [facility] [place] now with continued SBO due to tumor to start TPN.   Glucose / Insulin: CBGs since TPN started at goal < 180: range 152-164 - 4 units SSI required/24hr Electrolytes: All WNL - Na decreasing, Cl falling, Phos rising Renal: SCr trending up slightly, UOP 529m recorded (?accuracy) Hepatic: AST/ALT WNL, AlkPhos 124 TG: 85 (9/10), 68 (9/12)  Intake / Output; MIVF: I/O inaccurate, currently on  NS 10 ml/hr - 1366mNGT output, no BM recorded in past 24hr - previous loose stools noted GI Imaging: GI Surgeries / Procedures: 9/13: unsuccessful attempt at duodenal/jejunal stent placement  Central access: patient already with Port TPN start date: 9/10 9/14-RN called at 0330 to report patient pulled TPN line and it was disconnected from portacath then discarded; D10 80 ml/hr IV entered.  9/15 TPN restarted at goal rate, no interruptions noted   Nutritional Goals: Goal TPN rate is 80 mL/hr (provides 96 g of protein and 1939 kcals per day)  RD Assessment: Estimated Needs Total Energy Estimated Needs: 1800-2000 Total Protein Estimated Needs: 90-105g Total Fluid Estimated Needs: 2L/day  Current Nutrition:  NPO  Plan:  Continue TPN at goal 80 ml/hr this evening at 1800 Electrolytes in TPN:  Na 10028mL (increase) K 25m49m Ca 5mEq4m Mg 5mEq/10mPhos 5mmol/58mdecrease) Cl:Ac 1:2 (change) Add standard MVI and trace elements to TPN Continue Sensitive q8h SSI and adjust as needed  [facility]Thurs, and as needed  Ramere Downs WiPeggyann JubaD, BCPS Pharmacy: [phone] 7:07 AM

## 2020-11-25 NOTE — Consult Note (Signed)
Chief Complaint: Patient was seen in consultation today for percutaneous g-tube placement at the request of Dr. Reola Mosher.  Supervising Physician: Jacqulynn Cadet  Patient Status: [facility] - In-pt  History of Present Illness: Frank Garrett is a 60 y.o. 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  Stent placement was unsuccessful via enteroscopy.  Patient and family want to pursue second attempt at enteroscopy procedure at a tertiary center.  Patient is dependent on Sump tube at this time.  We have been asked to place a venting g-tube in the interim.  Past Medical History:  Diagnosis Date  . Anemia   . Cancer ([place]) 04/2016   Pancreatic Adenocarcinoma  . GERD (gastroesophageal reflux disease)   . Hypertension   . Pancreas cancer [facility])    Pancreatic cancer s/p radical distal pancreaticosplenectomy with resection of celiac axis, SMV, portal vein repair, lateral venorrhaphy, abdominal lymph node dissection, cholecystectomy on 10/19/2016 at [place].    Past Surgical History:  Procedure Laterality Date  . CHOLECYSTECTOMY  10/19/2016  . DISTAL PANCREATECTOMY  10/19/2016   Pancreatic cancer s/p radical distal pancreaticosplenectomy with resection of celiac axis, SMV, portal vein repair, lateral venorrhaphy, abdominal lymph node dissection, cholecystectomy on 10/19/2016 at [place].  . ENTEROSCOPY N/A 11/23/2020   Procedure: ENTEROSCOPY;  Surgeon: Clarene Essex, MD;  Location: [facility];  Service: Endoscopy;  Laterality: N/A;  with stent placement  . SPLENECTOMY, TOTAL  10/19/2016    Allergies: Oxycodone  Medications: Prior to Admission medications   Medication Sig Start Date End Date Taking? Authorizing Provider  ASTRAGALUS PO Take 600 mg by mouth 2 (two) times daily. 1 tablet - 300 mg   Yes [provider]  capecitabine (XELODA) 500 MG  tablet Take 500 mg by mouth See admin instructions. Take 3 tablets (1500 mg) by mouth three times daily for one week, hold for one week and then resume. 11/08/20  Yes [provider]  Cholecalciferol (VITAMIN D3) 5000 units CAPS Take 5,000 Units by mouth in the morning.   Yes [provider]  Cyanocobalamin (VITAMIN B-12) 1000 MCG/15ML LIQD Take 1,000 mcg by mouth every morning.   Yes [provider]  Digestive Enzymes (PANPLEX 2-PHASE PO) Take 1 capsule by mouth 3 (three) times daily with meals.   Yes [provider]  diphenoxylate-atropine (LOMOTIL) 2.5-0.025 MG tablet Take 2 tablets by mouth 4 (four) times daily as needed for diarrhea or loose stools. 10/06/20  Yes [provider]  gabapentin (NEURONTIN) 300 MG capsule Take 600 mg by mouth at bedtime.   Yes [provider]  Melatonin 10 MG CAPS Take 10 mg by mouth at bedtime.   Yes [provider]  omeprazole (PRILOSEC) 40 MG capsule Take 40 mg by mouth 2 (two) times daily.   Yes [provider]  ondansetron (ZOFRAN) 8 MG tablet Take 8 mg by mouth every 8 (eight) hours as needed for nausea or vomiting.   Yes [provider]  OVER THE COUNTER MEDICATION Take 1 drop by mouth every morning. [place] Gold 1200 liquid (1 drop=42.'9mg'$ )   Yes [provider]  OVER THE COUNTER MEDICATION Take 300 mg by mouth every morning. Theracurmin   Yes [provider]  OVER THE COUNTER MEDICATION Take 1 tablet by mouth every morning. FerraSorb   Yes [provider]  Pancrelipase, Lip-Prot-Amyl, (PANCREAZE) 21000-54700 units CPEP Take 1 capsule by mouth 3 (three) times  daily.   Yes [provider]  rivaroxaban (XARELTO) 20 MG TABS tablet Take 20 mg by mouth daily with supper.   Yes [provider]  traMADol (ULTRAM) 50 MG tablet Take 50 mg by mouth daily as needed (pain). 05/24/16  Yes [provider]  VITAMIN E PO Take 1 capsule by mouth  every morning.   Yes [provider]     History reviewed. No pertinent family history.  Social History   Socioeconomic History  . Marital status: Married    Spouse name: Not on file  . Number of children: Not on file  . Years of education: Not on file  . Highest education level: Not on file  Occupational History  . Not on file  Tobacco Use  . Smoking status: Former    Packs/day: 1.00    Years: 33.00    Pack years: 33.00    Types: Cigarettes    Quit date: 03/13/2010    Years since quitting: 10.7  . Smokeless tobacco: Never  Vaping Use  . Vaping Use: Never used  Substance and Sexual Activity  . Alcohol use: No    Comment: Former drinker (stopped in 2012)  . Drug use: No  . Sexual activity: Yes  Other Topics Concern  . Not on file  Social History Narrative  . Not on file   Social Determinants of Health   Financial Resource Strain: Not on file  Food Insecurity: Not on file  Transportation Needs: Not on file  Physical Activity: Not on file  Stress: Not on file  Social Connections: Not on file   Review of Systems: A 12 point ROS discussed and pertinent positives are indicated in the HPI above.  All other systems are negative.  Review of Systems  Constitutional:  Positive for activity change. Negative for chills, diaphoresis and fever.  Respiratory: Negative.    Cardiovascular: Negative.   Gastrointestinal:  Positive for diarrhea. Negative for abdominal distention and abdominal pain.  Neurological:  Negative for dizziness and headaches.  Psychiatric/Behavioral:  Negative for behavioral problems and confusion.    Vital Signs: BP 108/71 (BP Location: Right Arm)   Pulse (!) 56   Temp 98.6 F (37 C) (Oral)   Resp 20   Ht '5\' 8"'$  (1.727 m)   Wt 126 lb 12.2 oz (57.5 kg)   SpO2 100%   BMI 19.27 kg/m   Physical Exam Constitutional:      Appearance: He is ill-appearing.  HENT:     Head: Normocephalic and atraumatic.     Mouth/Throat:     Mouth: Mucous  membranes are moist.     Pharynx: Oropharynx is clear. No oropharyngeal exudate or posterior oropharyngeal erythema.  Cardiovascular:     Pulses: Normal pulses.     Heart sounds: Normal heart sounds.  Pulmonary:     Effort: Pulmonary effort is normal.     Breath sounds: Normal breath sounds.  Abdominal:     General: Abdomen is flat.     Palpations: Abdomen is soft.     Tenderness: There is abdominal tenderness.  Skin:    General: Skin is dry.  Neurological:     General: No focal deficit present.     Mental Status: He is alert.  Psychiatric:        Thought Content: Thought content normal.        Judgment: Judgment normal.    Imaging: DG Abd 1 View  Result Date: 11/23/2020 CLINICAL DATA:  NG tube placement EXAM:  ABDOMEN - 1 VIEW COMPARISON:  11/19/2020 FINDINGS: NG tube is in the stomach.  Nonobstructive bowel gas pattern. IMPRESSION: NG tube in the stomach. Electronically Signed   By: Rolm Baptise M.D.   On: 11/23/2020 17:13   CT Abdomen Pelvis W Contrast  Result Date: 11/14/2020 CLINICAL DATA:  Nausea/vomiting EXAM: CT ABDOMEN AND PELVIS WITH CONTRAST TECHNIQUE: Multidetector CT imaging of the abdomen and pelvis was performed using the standard protocol following bolus administration of intravenous contrast. CONTRAST:  24m OMNIPAQUE IOHEXOL 350 MG/ML SOLN COMPARISON:  None. FINDINGS: Lower chest: No acute abnormality. Hepatobiliary: There are multiple new hypodense liver lesions, many of which with peripheral enhancement, largest measuring 6.6 cm in the central liver (series 2, image 13). Prior cholecystectomy. Pancreas: Prior distal pancreatectomy and splenectomy. Residual pancreatic head tissue noted and unremarkable. Spleen: Splenectomy. Adrenals/Urinary Tract: Adrenal glands are unremarkable. Kidneys are normal, without renal calculi, focal lesion, or hydronephrosis. Bladder is unremarkable. Stomach/Bowel: Dilated stomach and duodenum. There is an abrupt transition point at the  duodenal jejunal junction (sagittal image 102). There is wall thickening of the colon at the hepatic flexure with mild adjacent stranding. The appendix is normal. Vascular/Lymphatic: Aortoiliac atherosclerotic calcifications. No AAA. There is severe narrowing of the portal vein and superior mesenteric vein with some collateral vessels noted. The celiac trunk is either completely obstructed external compression or there is a common celiac trunk and superior mesenteric artery. There is a hepatic artery arising from the SMA, likely right hepatic artery, which appears severely narrowed coursing through this soft tissue. Reproductive: Unremarkable. Other: Prominent soft tissue density in the lesser sac, surrounding the superior mesenteric artery takeoff, and encasing the proximal portal vein and portal-SMV confluence, severely narrowing these vessels. This soft tissue abuts the lesser curvature of the stomach in the left adrenal gland. Musculoskeletal: No acute osseous abnormality. No suspicious lytic or blastic lesions. There is a nodular density in the subcutaneous tissues of the right hemiabdomen measuring 7 mm (series 2, image 42). IMPRESSION: High-grade bowel obstruction with marked dilation of the stomach and duodenum, abrupt transition point at the duodenal-jejunal junction. This is either due to adhesions or metastatic disease. There is prominent soft tissue density in the lesser sac, encasing the superior mesenteric artery takeoff, portal vein and portal-SMV confluence, and right hepatic artery, consistent with metastatic disease. The celiac trunk is not visualized, either obstructed by external compression or there is a common trunk of the celiac and SMA. The SMA is patent. Severe narrowing of the proximal portal vein and portal-SMV confluence. Severe narrowing of the right hepatic artery which appears to arise from the SMA. Multiple hypodense liver lesions, new from prior exam in August 2018, consistent with  metastatic disease. Largest lesion measures up to 6.6 cm in the central liver, which demonstrates lower density than several other lesions and may represent a necrotic metastasis, possibly treatment related. Correlation with any more recent prior imaging, if available, would be useful. Nodular soft tissue density in the subcutaneous tissues along the right hemiabdomen measuring 7 mm, again correlation with prior exams would be useful. Focal colonic wall thickening with adjacent stranding at the hepatic flexure, consistent with mild focal colitis. These results were called by telephone at the time of interpretation on 11/14/2020 at 1:30 pm to provider HALEY SAGE , who verbally acknowledged these results. Electronically Signed   By: JMaurine SimmeringM.D.   On: 11/14/2020 13:46   DG CHEST PORT 1 VIEW  Result Date: 11/16/2020 CLINICAL DATA:  Emesis EXAM: PORTABLE  CHEST 1 VIEW COMPARISON:  Chest x-ray dated Jul 11, 2016 FINDINGS: Left chest wall port with the tip near the superior cavoatrial junction. NG tube partially seen coursing below the diaphragm. The heart size and mediastinal contours are within normal limits. Both lungs are clear. The visualized skeletal structures are unremarkable. IMPRESSION: Lungs are clear. Electronically Signed   By: Yetta Glassman M.D.   On: 11/16/2020 12:50   DG Abd 2 Views  Result Date: 11/18/2020 CLINICAL DATA:  Small bowel obstruction. EXAM: ABDOMEN - 2 VIEW COMPARISON:  11/16/2020 FINDINGS: NG tube is looped in the stomach. Lung bases are unremarkable. No gaseous small bowel dilatation on the current study. Contrast material seen in the colon previously has passed in the interval. Visualized bony anatomy unremarkable. IMPRESSION: No evidence for gaseous small bowel dilatation on the current study. Electronically Signed   By: Misty Stanley M.D.   On: 11/18/2020 11:51   DG Abd Portable 1V-Small Bowel Obstruction Protocol-24 hr delay  Result Date: 11/16/2020 CLINICAL DATA:   Small-bowel protocol, small-bowel obstruction, 24 hour postcontrast imaging EXAM: PORTABLE ABDOMEN - 1 VIEW COMPARISON:  11/15/2020 FINDINGS: Nasogastric tube remains coiled within the gastric fundus. Previously noted dilated loops of small bowel within the epigastrium containing dilute contrast are no longer visualized. Contrast is seen within the decompressed distal colon and rectal vault. Normal abdominal gas pattern. No gross free intraperitoneal gas. IMPRESSION: Normal abdominal gas pattern. Administered oral contrast is now seen within the distal colon and rectal vault. Electronically Signed   By: Fidela Salisbury M.D.   On: 11/16/2020 01:15   DG Abd Portable 1V-Small Bowel Obstruction Protocol-initial, 8 hr delay  Result Date: 11/15/2020 CLINICAL DATA:  8 hour delay image for small bowel obstruction. EXAM: PORTABLE ABDOMEN - 1 VIEW COMPARISON:  Radiographs 11/15/2020 and 11/14/2020.  CT 11/14/2020. FINDINGS: 0912 hours. No enteric contrast is visible on the recent prior studies. Nasogastric tube is looped in the proximal stomach which appears decompressed. There is faint contrast material within the duodenum which remains markedly dilated. Some contrast has passed into the colon which is normal in caliber. There is residual faint contrast material in the bladder from previous CT. No extravasated contrast or signs of free air. The bones appear unremarkable. IMPRESSION: Some contrast has passed into the colon which is normal in caliber. However, there is persistent significant dilatation of the duodenum consistent with a high-grade partial bowel obstruction as seen on CT. Electronically Signed   By: Richardean Sale M.D.   On: 11/15/2020 09:31   DG Abd Portable 1V  Result Date: 11/15/2020 CLINICAL DATA:  NG tube position EXAM: PORTABLE ABDOMEN - 1 VIEW COMPARISON:  11/14/2020 FINDINGS: NG tube coils in the fundus of the stomach, unchanged since prior study. IMPRESSION: NG tube continues to coil in the fundus  of the stomach. Electronically Signed   By: Rolm Baptise M.D.   On: 11/15/2020 00:59   DG Abd Portable 1V  Result Date: 11/14/2020 CLINICAL DATA:  NG tube EXAM: PORTABLE ABDOMEN - 1 VIEW COMPARISON:  11/14/2020 FINDINGS: NG tube coils in the fundus of the stomach. IMPRESSION: NG tube in the fundus of the stomach. Electronically Signed   By: Rolm Baptise M.D.   On: 11/14/2020 23:45   DG Abd Portable 1V-Small Bowel Protocol-Position Verification  Result Date: 11/14/2020 CLINICAL DATA:  NG tube placement. EXAM: PORTABLE ABDOMEN - 1 VIEW COMPARISON:  CT earlier today. FINDINGS: Tip and side port of the enteric tube below the diaphragm in the stomach.  No gaseous distention of bowel loops in the upper abdomen. Excreted IV contrast in the renal collecting systems. IMPRESSION: Tip and side port of the enteric tube below the diaphragm in the stomach. Electronically Signed   By: Keith Rake M.D.   On: 11/14/2020 18:47   DG Abd Portable 2V  Result Date: 11/19/2020 CLINICAL DATA:  Continued nausea, abdominal pain, distention, NG tube in place EXAM: PORTABLE ABDOMEN - 2 VIEW COMPARISON:  Radiograph 11/18/2020 FINDINGS: There is a nasogastric tube with tip and side port overlying the stomach. Partially visualized central venous catheter with tip overlying the superior cavoatrial junction. There is contrast material throughout the colon. No visible small bowel dilation on the current study. IMPRESSION: Contrast material has passed into the colon, without visible stomach or duodenal dilation on the current exam. Electronically Signed   By: Maurine Simmering M.D.   On: 11/19/2020 12:59   DG UGI W SINGLE CM (SOL OR THIN BA)  Result Date: 11/18/2020 CLINICAL DATA:  CT demonstrating small-bowel obstruction at the duodenal/jejunal junction. History of Whipple procedure for pancreatic cancer in 2018 EXAM: UPPER GI SERIES WITH KUB TECHNIQUE: After obtaining a scout radiograph a focused upper GI series was performed using thin  barium FLUOROSCOPY TIME:  Fluoroscopy Time:  5 minutes and 5 seconds Radiation Exposure Index (if provided by the fluoroscopic device): Number of Acquired Spot Images: 0 COMPARISON:  CT of 11/14/2020 FINDINGS: Preprocedure scout film demonstrates a nasogastric tube in the proximal stomach. No residual gastric distension. Administration of 180 cc of dilute barium with placement in supine and right-side-down decubitus imaging demonstrates normal caliber of the stomach. The duodenal C loop is dilated, on the order of 3.3 cm. Despite contrast administration and repositioning, contrast does not fill the proximal jejunum. IMPRESSION: Resolution of gastric obstruction, in the setting of nasogastric tube placement. Residual duodenal dilatation with lack of contrast passage into the jejunum. Findings again favor high-grade obstruction at the duodenal/jejunal junction. Consider plain film follow-up to evaluate for eventual opacification of the jejunum. Electronically Signed   By: Abigail Miyamoto M.D.   On: 11/18/2020 15:53   DG C-Arm 1-60 Min  Result Date: 11/23/2020 CLINICAL DATA:  Endoscopy and stent placement EXAM: DG C-ARM 1-60 MIN FLUOROSCOPY TIME:  Fluoroscopy Time:  8 minutes 47 seconds Radiation Exposure Index (if provided by the fluoroscopic device): 91.41 mGy Number of Acquired Spot Images: 13 COMPARISON:  11/18/2020 FINDINGS: Thirteen fluoroscopic images are obtained during the performance of the procedure and are provided for interpretation only. And scope overlies dilated gas-filled loops of bowel within the mid abdomen. Contrast is seen outlining the loops of bowel. Please refer to operative report. IMPRESSION: 1. Intraoperative evaluation as above. Please refer to the operative report. Electronically Signed   By: Randa Ngo M.D.   On: 11/23/2020 16:20    Labs:  CBC: Recent Labs    11/17/20 0408 11/18/20 0454 11/19/20 0430 11/23/20 0722  WBC 2.9* 3.1* 3.8* 8.3  HGB 9.7* 9.2* 8.8* 8.8*  HCT  31.2* 30.2* 28.9* 28.3*  PLT 269 298 357 405*    COAGS: No results for input(s): INR, APTT in the last 8760 hours.  BMP: Recent Labs    11/22/20 0331 11/23/20 0723 11/24/20 0352 11/25/20 0356  NA 140 141 139 136  K 4.3 4.7 4.7 3.9  CL 102 105 104 98  CO2 '29 30 30 30  '$ GLUCOSE 104* 114* 107* 152*  BUN 18 22* 21* 18  CALCIUM 9.5 9.6 9.4 9.1  CREATININE  0.99 0.74 0.93 1.07  GFRNONAA >60 >60 >60 >60    LIVER FUNCTION TESTS: Recent Labs    11/22/20 0331 11/23/20 0723 11/24/20 0352 11/25/20 0356  BILITOT 0.6 0.3 0.5 0.4  AST '18 17 18 19  '$ ALT '24 21 19 18  '$ ALKPHOS 136* 136* 127* 124  PROT 6.7 6.8 6.8 6.5  ALBUMIN 3.0* 3.1* 2.9* 2.8*     Assessment and Plan:  End Stage Pancreatic Cancer Small Bowel Obstruction For Venting percutaneous g-tube tomorrow, reviewed with internationalist, Dr. Laurence Ferrari who approves procedure  Risks and benefits image guided gastrostomy tube placement was discussed with the patient and wife including, but not limited to the need for a barium enema during the procedure, bleeding, infection, peritonitis and/or damage to adjacent structures.  All of the patient's questions were answered, patient is agreeable to proceed.  Consent signed and in chart.   Thank you for this interesting consult.  I greatly enjoyed meeting Cleaven Reuther and look forward to participating in their care.  A copy of this report was sent to the requesting provider on this date.  Electronically Signed: Pasty Spillers, PA 11/25/2020, 2:38 PM   I spent a total of 40 Minutes  in face to face in clinical consultation, greater than 50% of which was counseling/coordinating care for percutaneous g-tube placement.

## 2020-11-25 NOTE — Progress Notes (Signed)
Spoke to surgeon, Dr. Claude Manges at [place], who has spoken to the patient's medical oncologist.  It appears their opinion is that there isn't much else to offer the patient at this point.  I have given him the wife's cell phone number and he and the medical oncologist are going to call her together today to discuss their medical thoughts for this patient moving forward.  We will await this conversation.  Discussed this with Dr. Avon Gully.  Henreitta Cea 10:44 AM 11/25/2020

## 2020-11-25 NOTE — Progress Notes (Signed)
PROGRESS NOTE  Frank Garrett  [id number] DOB: 1960-11-18 DOA: 11/14/2020 PCP: Tobie Lords D, FNP   Brief Narrative: Frank Garrett is a 60 y.o. male with a history of pancreatic CA s/p [place] 2018 and chemotherapy, breakthrough covid-19 infection Jan 2022 who presented to the ED 9/4 with nausea, vomiting due to small bowel obstruction which is felt to be due to adhesions from surgery vs. neoplastic invasion. Symptoms have improved dramatically with NG Tube, NPO status, and TPN has been started. GI attempt at endoscopic placement of stent at duodenum-jejunum junction on 9/13 was unsuccessful. the patient and wife have requested transfer to [place]. I have initiated this transfer, have yet to hear from hospitalist.   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  - Continue TPN, started 9/10. K trending up, LFTs stable, insulin dosing appears good. - Continue NPO, NGT. Output from NG remains significant.  - IR consulted for PEG tube placement for symptom management/venting given ongoing obstruction - Ultimate plan to follow up in 2 weeks outpatient at [facility] for possible stenting of stricture in the outpatient setting. - TPN to continue outpatient -Will need home health and close management to ensure appropriate nutrition until stenting can be attempted.  - Lengthy discussion about needing to focus on palliative/hospice should stenting be unsuccessful again.  Positive covid-19 PCR:  -Noted on 9/4. With cycle threshold of 33.6 and no attributable symptoms, this could be seen as a persistent (false) positive in an immunocompromised patient who had covid earlier this year - Isolation has been discontinued and the patient has remained asymptomatic from this perspective requiring no treatment.   History of PE (Aug 2022):  - No current hypoxia or evidence of RV overload.  - Restarted anticoagulation post-EGD.    Metastatic  pancreatic CA:  - Continue care through [facility].  -Continues to be difficult case given above SBO likely secondary to metastatic disease, initial plan for PEG tube placement for symptom management and drainage, continue TPN in the outpatient setting with home health with outpatient follow-up with GI at [facility] in 2 weeks for hopeful stenting of small bowel to alleviate symptoms and increase p.o. intake and nutrition. -Lengthy discussion today at bedside with patient and wife to set expectations given metastatic disease and possible issues if GI stent fails outpatient.  AKI, hypernatremia: Resolved.  Neutropenia, anemia: Polychromasia present. Had some schistocytes previously, none seen 9/13 and hgb stable at 8.8 g/dl. WBC has normalized.  RN Pressure Injury Documentation: Pressure Injury 11/22/20 Sacrum Lower Stage 2 -  Partial thickness loss of dermis presenting as a shallow open injury with a red, pink wound bed without slough. Pink, no drainage. Surrounding skin in tact. (Active)  11/22/20 0829  Location: Sacrum  Location Orientation: Lower  Staging: Stage 2 -  Partial thickness loss of dermis presenting as a shallow open injury with a red, pink wound bed without slough.  Wound Description (Comments): Pink, no drainage. Surrounding skin in tact.  Present on Admission: Yes   DVT prophylaxis: Therapeutic lovenox restarted Code Status: Full Family Communication: Family at bedside Disposition Plan:  Status is: Inpatient  Remains inpatient appropriate because:IV treatments appropriate due to intensity of illness or inability to take PO  Dispo: The patient is from: Home              Anticipated d/c is to: Attempting transfer to [facility] in [place].  Patient currently is not medically stable to d/c.   Difficult to place patient No  Consultants:  General surgery GI Palliative care  Procedures:  Small bowel enteroscopy 11/23/2020 Dr. Clarene Essex:   Findings:      Localized mild inflammation characterized by linear erosions was found       on the greater curvature of the stomach. From previous NG trauma      Diffuse dilated small bowel was found in the duodenal bulb, in the first       portion of the duodenum, in the second portion of the duodenum, in the       third portion of the duodenum and in the fourth portion of the duodenum.      An extrinsic stenosis that was non-traversed was found in the proximal       jejunum. Estimated blood loss: None. A guide wire was inserted into the       jejunum and the endoscope was removed. The colonoscope was advanced over       the guide wire into the duodenum. Placement was confirmed by X-ray.       Unfortunately the wire was accidentally withdrawn back into the duodenum       and could not be readvanced past the obstruction estimated blood loss:       none. Impression:       - Caustic gastritis.                           - Dilated duodenum was found in the duodenum.                           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 Recommendation: - NPO indefinitely. Replace NG tube as needed will                            allow him to have at least a few hours without it                           - [facility] the small bowel enteroscopy at a                            un[facility] if he would like 1 more try and using                            the side-viewing ERCP scope may be worthwhile.                           -  Return to GI clinic PRN.                           - Telephone GI clinic if symptomatic PRN.                           - Refer to an interventional radiologist at                            appointment to be scheduled if  PEG placement is                            desired.   Antimicrobials: None   Subjective: No acute issues or events overnight, NG tube continues to drain dark green liquid, agreeable today for PEG tube placement -abdominal pain currently well controlled.  Objective: Vitals:   11/24/20 0608 11/24/20 1300 11/24/20 2143 11/25/20 0519  BP: 92/65  93/67 104/74  Pulse: (!) 56  (!) 55 (!) 58  Resp: '15  16 16  '$ Temp: 98.8 F (37.1 C)  98.9 F (37.2 C) 98.7 F (37.1 C)  TempSrc: Oral     SpO2: 99%  100% 100%  Weight:  57.5 kg    Height:        Intake/Output Summary (Last 24 hours) at 11/25/2020 0758 Last data filed at 11/25/2020 0645 Gross per 24 hour  Intake 3081.74 ml  Output 1939 ml  Net 1142.74 ml    Filed Weights   11/14/20 1839 11/24/20 1300  Weight: 57.7 kg 57.5 kg   Gen: 60 y.o. male in no distress Pulm: Nonlabored breathing room air. Clear. CV: Regular rate and rhythm. No murmur, rub, or gallop. No JVD, no dependent edema. GI: Abdomen soft, non-tender, non-distended, with normoactive bowel sounds.  Ext: Warm, no deformities Skin: No new rashes, lesions or ulcers on visualized skin. Port site in left upper chest remains c/d/i Neuro: Alert and oriented. No focal neurological deficits. Psych: Judgement and insight appear fair. Mood euthymic & affect congruent. Behavior is appropriate.    Data Reviewed: I have personally reviewed following labs and imaging studies  CBC: Recent Labs  Lab 11/19/20 0430 11/23/20 0722  WBC 3.8* 8.3  NEUTROABS 1.4* 4.5  HGB 8.8* 8.8*  HCT 28.9* 28.3*  MCV 95.7 95.0  PLT 357 405*    Basic Metabolic Panel: Recent Labs  Lab 11/21/20 0441 11/22/20 0331 11/23/20 0723 11/24/20 0352 11/25/20 0356  NA 138 140 141 139 136  K 4.8 4.3 4.7 4.7 3.9  CL 101 102 105 104 98  CO2 '26 29 30 30 30  '$ GLUCOSE 119* 104* 114* 107* 152*  BUN 15 18 22* 21* 18  CREATININE 1.13 0.99 0.74 0.93 1.07  CALCIUM 9.3 9.5 9.6 9.4 9.1  MG 2.1 2.0 2.2 2.0  2.1  PHOS 3.9 4.1 3.5 3.4 4.3    GFR: Estimated Creatinine Clearance: 61.2 mL/min (by C-G formula based on SCr of 1.07 mg/dL). Liver Function Tests: Recent Labs  Lab 11/21/20 0441 11/22/20 0331 11/23/20 0723 11/24/20 0352 11/25/20 0356  AST '21 18 17 18 19  '$ ALT '29 24 21 19 18  '$ ALKPHOS 153* 136* 136* 127* 124  BILITOT 0.5 0.6 0.3 0.5 0.4  PROT 7.1 6.7 6.8 6.8 6.5  ALBUMIN 3.2* 3.0* 3.1* 2.9* 2.8*    No results for input(s): LIPASE, AMYLASE  in the last 168 hours. No results for input(s): AMMONIA in the last 168 hours. Coagulation Profile: No results for input(s): INR, PROTIME in the last 168 hours. Cardiac Enzymes: No results for input(s): CKTOTAL, CKMB, CKMBINDEX, TROPONINI in the last 168 hours. BNP (last 3 results) No results for input(s): PROBNP in the last 8760 hours. HbA1C: No results for input(s): HGBA1C in the last 72 hours. CBG: Recent Labs  Lab 11/23/20 2324 11/24/20 0740 11/24/20 1712 11/24/20 2336 11/25/20 0725  GLUCAP 172* 128* 122* 164* 141*    Lipid Profile: No results for input(s): CHOL, HDL, LDLCALC, TRIG, CHOLHDL, LDLDIRECT in the last 72 hours.  Thyroid Function Tests: No results for input(s): TSH, T4TOTAL, FREET4, T3FREE, THYROIDAB in the last 72 hours. Anemia Panel: No results for input(s): VITAMINB12, FOLATE, FERRITIN, TIBC, IRON, RETICCTPCT in the last 72 hours. Urine analysis:    Component Value Date/Time   COLORURINE YELLOW (A) 11/14/2020 1650   APPEARANCEUR CLEAR (A) 11/14/2020 1650   LABSPEC 1.020 11/14/2020 1650   PHURINE 5.5 11/14/2020 1650   GLUCOSEU NEGATIVE 11/14/2020 1650   HGBUR NEGATIVE 11/14/2020 1650   BILIRUBINUR SMALL (A) 11/14/2020 1650   KETONESUR NEGATIVE 11/14/2020 1650   PROTEINUR TRACE (A) 11/14/2020 1650   UROBILINOGEN 2.0 (H) 08/20/2006 1249   NITRITE NEGATIVE 11/14/2020 1650   LEUKOCYTESUR NEGATIVE 11/14/2020 1650   No results found for this or any previous visit (from the past 240 hour(s)).      Radiology Studies: DG Abd 1 View  Result Date: 11/23/2020 CLINICAL DATA:  NG tube placement EXAM: ABDOMEN - 1 VIEW COMPARISON:  11/19/2020 FINDINGS: NG tube is in the stomach.  Nonobstructive bowel gas pattern. IMPRESSION: NG tube in the stomach. Electronically Signed   By: Rolm Baptise M.D.   On: 11/23/2020 17:13   DG C-Arm 1-60 Min  Result Date: 11/23/2020 CLINICAL DATA:  Endoscopy and stent placement EXAM: DG C-ARM 1-60 MIN FLUOROSCOPY TIME:  Fluoroscopy Time:  8 minutes 47 seconds Radiation Exposure Index (if provided by the fluoroscopic device): 91.41 mGy Number of Acquired Spot Images: 13 COMPARISON:  11/18/2020 FINDINGS: Thirteen fluoroscopic images are obtained during the performance of the procedure and are provided for interpretation only. And scope overlies dilated gas-filled loops of bowel within the mid abdomen. Contrast is seen outlining the loops of bowel. Please refer to operative report. IMPRESSION: 1. Intraoperative evaluation as above. Please refer to the operative report. Electronically Signed   By: Randa Ngo M.D.   On: 11/23/2020 16:20    Scheduled Meds:  bisacodyl  10 mg Rectal Daily   Chlorhexidine Gluconate Cloth  6 each Topical Daily   enoxaparin (LOVENOX) injection  60 mg Subcutaneous Q12H   insulin aspart  0-9 Units Subcutaneous Q8H   lip balm  1 application Topical BID   sodium chloride flush  10-40 mL Intracatheter Q12H   Continuous Infusions:  sodium chloride 20 mL/hr at 11/24/20 1646   dextrose 5 % and 0.45% NaCl 10 mL/hr at 11/22/20 1902   lactated ringers Stopped (11/23/20 1526)   methocarbamol (ROBAXIN) IV 1,000 mg (11/17/20 1313)   ondansetron (ZOFRAN) IV     TPN ADULT (ION) 80 mL/hr at 11/25/20 0645   TPN ADULT (ION)       LOS: 11 days   Time spent: 35 minutes.  Little Ishikawa, DO  Pager: Secure chat  11/25/2020, 7:58 AM

## 2020-11-25 NOTE — Progress Notes (Signed)
Progress Note  2 Days Post-Op  Subjective: Denies abdominal pain, but NGT wasn't working some overnight and could feel things getting backed up.    Objective: Vital signs in last 24 hours: Temp:  [98.7 F (37.1 C)-98.9 F (37.2 C)] 98.7 F (37.1 C) (09/15 0519) Pulse Rate:  [55-58] 58 (09/15 0519) Resp:  [16-19] 19 (09/15 0800) BP: (93-104)/(67-74) 104/74 (09/15 0519) SpO2:  [100 %] 100 % (09/15 0519) Weight:  [57.5 kg] 57.5 kg (09/14 1300) Last BM Date: 11/23/20 (per pt./pt. family)  Intake/Output from previous day: 09/14 0701 - 09/15 0700 In: 3081.7 [P.O.:770; I.V.:1975.7; NG/GT:336] Out: 1939 [Urine:575; Emesis/NG output:1364] Intake/Output this shift: Total I/O In: 100.1 [I.V.:100.1] Out: 650 [Urine:400; Emesis/NG output:250]  PE: General appearance: alert, cooperative, no distress GI: abdomen soft, nontender, NG is draining green bilious fluid   Lab Results:  Recent Labs    11/23/20 0722  WBC 8.3  HGB 8.8*  HCT 28.3*  PLT 405*   BMET Recent Labs    11/24/20 0352 11/25/20 0356  NA 139 136  K 4.7 3.9  CL 104 98  CO2 30 30  GLUCOSE 107* 152*  BUN 21* 18  CREATININE 0.93 1.07  CALCIUM 9.4 9.1   PT/INR No results for input(s): LABPROT, INR in the last 72 hours. CMP     Component Value Date/Time   NA 136 11/25/2020 0356   K 3.9 11/25/2020 0356   CL 98 11/25/2020 0356   CO2 30 11/25/2020 0356   GLUCOSE 152 (H) 11/25/2020 0356   BUN 18 11/25/2020 0356   CREATININE 1.07 11/25/2020 0356   CALCIUM 9.1 11/25/2020 0356   PROT 6.5 11/25/2020 0356   ALBUMIN 2.8 (L) 11/25/2020 0356   AST 19 11/25/2020 0356   ALT 18 11/25/2020 0356   ALKPHOS 124 11/25/2020 0356   BILITOT 0.4 11/25/2020 0356   GFRNONAA >60 11/25/2020 0356   GFRAA >60 11/11/2016 0404   Lipase     Component Value Date/Time   LIPASE 207 (H) 11/14/2020 1152       Studies/Results: DG Abd 1 View  Result Date: 11/23/2020 CLINICAL DATA:  NG tube placement EXAM: ABDOMEN - 1 VIEW  COMPARISON:  11/19/2020 FINDINGS: NG tube is in the stomach.  Nonobstructive bowel gas pattern. IMPRESSION: NG tube in the stomach. Electronically Signed   By: Rolm Baptise M.D.   On: 11/23/2020 17:13   DG C-Arm 1-60 Min  Result Date: 11/23/2020 CLINICAL DATA:  Endoscopy and stent placement EXAM: DG C-ARM 1-60 MIN FLUOROSCOPY TIME:  Fluoroscopy Time:  8 minutes 47 seconds Radiation Exposure Index (if provided by the fluoroscopic device): 91.41 mGy Number of Acquired Spot Images: 13 COMPARISON:  11/18/2020 FINDINGS: Thirteen fluoroscopic images are obtained during the performance of the procedure and are provided for interpretation only. And scope overlies dilated gas-filled loops of bowel within the mid abdomen. Contrast is seen outlining the loops of bowel. Please refer to operative report. IMPRESSION: 1. Intraoperative evaluation as above. Please refer to the operative report. Electronically Signed   By: Randa Ngo M.D.   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 Possible referral to tertiary care facility for another attempt, but also awaiting a call back from surgeon and onc at [facility] to determine whether they want him transferred there. - surgeon in [place], so I will recontact shortly. -d/w primary -ultimately patient is worsening and palliative approach is best, but family does not quite seem to be in that place yet.    FEN: ice chips/sips, IVF per [facility], NGT to [facility] ID: None DVT: Lovenox     COVID-positive -asymptomatic, off precautions    Hypertension Chronic diarrhea GERD Dehydration - Cr 0.99 Leukopenia -  WBC 3.8 (9/9)   Paliative Care recommendations as of 11/17/20 Continue to treat the treatable When his wife brings [facility] POA paperwork and we will scanned this into the system Hold off on further goals of care discussion for now May need further [place] discussion if any significant clinical change PMT will continue to follow  LOS: 11 days    Henreitta Cea, [facility] Surgery 11/25/2020, 10:15 AM Please see Amion for pager number during day hours 7:00am-4:30pm

## 2020-11-25 NOTE — Progress Notes (Addendum)
[facility] Progress Note  Rawland Serino 60 y.o. 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   Subjective: Patient had enteroscopy with unsuccessful stenting of jejunal stenosis. Patient with continued NG tube placement.  Patient yesterday had 2 to 3-hour window without NG tube, had immediate symptoms with abdominal distention and nausea.   NG tube placed and had 1800 mL removed. With NG tube in place patient denies AB pain, nausea, vomiting.  No fever, chills, shortness of breath or chest pain.  Patient's wife is present in the room.  ROS : Review of Systems  Constitutional:  Negative for chills and fever.  Respiratory:  Negative for shortness of breath.   Cardiovascular:  Negative for chest pain and leg swelling.  Gastrointestinal:  Positive for diarrhea (loose stools normal for patient x 5 years). Negative for abdominal pain, blood in stool, constipation, nausea and vomiting.  Musculoskeletal:  Negative for falls.  Neurological:  Negative for focal weakness.  Psychiatric/Behavioral:  Negative for memory loss. The patient is not nervous/anxious.     Objective: Vital signs in last 24 hours: Vitals:   11/25/20 0519 11/25/20 0800  BP: 104/74   Pulse: (!) 58   Resp: 16 19  Temp: 98.7 F (37.1 C)   SpO2: 100%     Physical Exam: Physical Exam Constitutional:      General: He is not in acute distress.    Appearance: He is cachectic.     Comments: NGT in place  Eyes:     General: No scleral icterus.    Conjunctiva/sclera: Conjunctivae normal.  Cardiovascular:     Rate and Rhythm: Normal rate and regular rhythm.  Pulmonary:     Effort: Pulmonary effort is normal. No respiratory distress.  Abdominal:     General: There is no distension.     Palpations: Abdomen is soft.     Tenderness: There is no abdominal tenderness. There is no guarding or rebound.  Musculoskeletal:        General:  Normal range of motion.  Skin:    Coloration: Skin is not jaundiced.  Neurological:     General: No focal deficit present.     Mental Status: He is oriented to person, place, and time.  Psychiatric:        Attention and Perception: Attention normal.        Mood and Affect: Mood is depressed.        Speech: Speech normal.        Behavior: Behavior normal.     Lab Results: Recent Labs    11/24/20 0352 11/25/20 0356  NA 139 136  K 4.7 3.9  CL 104 98  CO2 30 30  GLUCOSE 107* 152*  BUN 21* 18  CREATININE 0.93 1.07  CALCIUM 9.4 9.1  MG 2.0 2.1  PHOS 3.4 4.3    Recent Labs    11/24/20 0352 11/25/20 0356  AST 18 19  ALT 19 18  ALKPHOS 127* 124  BILITOT 0.5 0.4  PROT 6.8 6.5  ALBUMIN 2.9* 2.8*    Recent Labs    11/23/20 0722  WBC 8.3  NEUTROABS 4.5  HGB 8.8*  HCT 28.3*  MCV 95.0  PLT 405*    No results for input(s): LABPROT, INR in the last 72 hours.  Lab Results: Results for orders placed or performed during the hospital encounter of 11/14/20 (from the past 48 hour(s))  Glucose, capillary     Status:  Abnormal   Collection Time: 11/23/20  5:06 PM  Result Value Ref Range   Glucose-Capillary 155 (H) 70 - 99 mg/dL    Comment: Glucose reference range applies only to samples taken after fasting for at least 8 hours.  Glucose, capillary     Status: Abnormal   Collection Time: 11/23/20 11:24 PM  Result Value Ref Range   Glucose-Capillary 172 (H) 70 - 99 mg/dL    Comment: Glucose reference range applies only to samples taken after fasting for at least 8 hours.  Comprehensive metabolic panel     Status: Abnormal   Collection Time: 11/24/20  3:52 AM  Result Value Ref Range   Sodium 139 135 - 145 mmol/L   Potassium 4.7 3.5 - 5.1 mmol/L   Chloride 104 98 - 111 mmol/L   CO2 30 22 - 32 mmol/L   Glucose, Bld 107 (H) 70 - 99 mg/dL    Comment: Glucose reference range applies only to samples taken after fasting for at least 8 hours.   BUN 21 (H) 6 - 20 mg/dL    Creatinine, Ser 0.93 0.61 - 1.24 mg/dL   Calcium 9.4 8.9 - 10.3 mg/dL   Total Protein 6.8 6.5 - 8.1 g/dL   Albumin 2.9 (L) 3.5 - 5.0 g/dL   AST 18 15 - 41 U/L   ALT 19 0 - 44 U/L   Alkaline Phosphatase 127 (H) 38 - 126 U/L   Total Bilirubin 0.5 0.3 - 1.2 mg/dL   GFR, Estimated >60 >60 mL/min    Comment: (NOTE) Calculated using the CKD-EPI Creatinine Equation (2021)    Anion gap 5 5 - 15    Comment: Performed at [facility], [place] [address] [place], [place] [address]  Magnesium     Status: None   Collection Time: 11/24/20  3:52 AM  Result Value Ref Range   Magnesium 2.0 1.7 - 2.4 mg/dL    Comment: Performed at [facility], [place] [address] [place], [place] [address]  Phosphorus     Status: None   Collection Time: 11/24/20  3:52 AM  Result Value Ref Range   Phosphorus 3.4 2.5 - 4.6 mg/dL    Comment: Performed at [facility], [place] [address], [place], [place] [address]  Glucose, capillary     Status: Abnormal   Collection Time: 11/24/20  7:40 AM  Result Value Ref Range   Glucose-Capillary 128 (H) 70 - 99 mg/dL    Comment: Glucose reference range applies only to samples taken after fasting for at least 8 hours.  Glucose, capillary     Status: Abnormal   Collection Time: 11/24/20  5:12 PM  Result Value Ref Range   Glucose-Capillary 122 (H) 70 - 99 mg/dL    Comment: Glucose reference range applies only to samples taken after fasting for at least 8 hours.  Glucose, capillary     Status: Abnormal   Collection Time: 11/24/20 11:36 PM  Result Value Ref Range   Glucose-Capillary 164 (H) 70 - 99 mg/dL    Comment: Glucose reference range applies only to samples taken after fasting for at least 8 hours.  Comprehensive metabolic panel     Status: Abnormal   Collection Time: 11/25/20  3:56 AM  Result Value Ref Range   Sodium 136 135 - 145 mmol/L   Potassium 3.9 3.5 - 5.1 mmol/L    Comment: DELTA CHECK NOTED   Chloride 98 98 - 111  mmol/L   CO2 30  22 - 32 mmol/L   Glucose, Bld 152 (H) 70 - 99 mg/dL    Comment: Glucose reference range applies only to samples taken after fasting for at least 8 hours.   BUN 18 6 - 20 mg/dL   Creatinine, Ser 1.07 0.61 - 1.24 mg/dL   Calcium 9.1 8.9 - 10.3 mg/dL   Total Protein 6.5 6.5 - 8.1 g/dL   Albumin 2.8 (L) 3.5 - 5.0 g/dL   AST 19 15 - 41 U/L   ALT 18 0 - 44 U/L   Alkaline Phosphatase 124 38 - 126 U/L   Total Bilirubin 0.4 0.3 - 1.2 mg/dL   GFR, Estimated >60 >60 mL/min    Comment: (NOTE) Calculated using the CKD-EPI Creatinine Equation (2021)    Anion gap 8 5 - 15    Comment: Performed at [facility], [place] [address], [place], [place] [address]  Magnesium     Status: None   Collection Time: 11/25/20  3:56 AM  Result Value Ref Range   Magnesium 2.1 1.7 - 2.4 mg/dL    Comment: Performed at [facility], [place] [address] [place], [place] [address]  Phosphorus     Status: None   Collection Time: 11/25/20  3:56 AM  Result Value Ref Range   Phosphorus 4.3 2.5 - 4.6 mg/dL    Comment: Performed at [facility], [place] [address] [place], [place] [address]  Glucose, capillary     Status: Abnormal   Collection Time: 11/25/20  7:25 AM  Result Value Ref Range   Glucose-Capillary 141 (H) 70 - 99 mg/dL    Comment: Glucose reference range applies only to samples taken after fasting for at least 8 hours.    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 Unsuccessful stenting of jejunal obstruction on 11/23/2020 History of pulmonary embolism -currently on full dose Lovenox  Plan:  Dr. Holli Humbles, current attending physician and I both approached the patient this morning. Wife and patient express their frustration, has been in the hospital for 2 weeks. With short amount of time off NG tube patient had significant re accumulation and  symptoms, will likely need gastrotomy tube placed here if he wishes to leave. Long discussion about options of being transferred to [facility]( should know results if able by 12 today) versus second attempt enteroscopy with duodenal stent placement at a tertiary center. I have discussed with [facility] and they are willing to accept the patient for outpatient referral if they wish to take this route however patient would still need gastrotomy placement, be n.p.o., and continue TPN with PICC line outpatient prior to this appointment.  Discussion again with the patient that their condition is terminal and things being done at this time is palliative treatment. Patient asked to discuss privately with his wife about their options.  Prognosis is poor, discussed palliative and hospice options, resistant.       Vladimir Crofts PA-C 11/25/2020, 11:17 AM

## 2020-11-26 ENCOUNTER — Inpatient Hospital Stay (HOSPITAL_COMMUNITY): Payer: Commercial Managed Care - PPO

## 2020-11-26 HISTORY — PX: IR GASTROSTOMY TUBE MOD SED: IMG625

## 2020-11-26 LAB — CBC WITH DIFFERENTIAL/PLATELET
Abs Immature Granulocytes: 0.94 10*3/uL — ABNORMAL HIGH (ref 0.00–0.07)
Basophils Absolute: 0.1 10*3/uL (ref 0.0–0.1)
Basophils Relative: 1 %
Eosinophils Absolute: 0.1 10*3/uL (ref 0.0–0.5)
Eosinophils Relative: 1 %
HCT: 26.6 % — ABNORMAL LOW (ref 39.0–52.0)
Hemoglobin: 8.2 g/dL — ABNORMAL LOW (ref 13.0–17.0)
Immature Granulocytes: 8 %
Lymphocytes Relative: 13 %
Lymphs Abs: 1.6 10*3/uL (ref 0.7–4.0)
MCH: 29.3 pg (ref 26.0–34.0)
MCHC: 30.8 g/dL (ref 30.0–36.0)
MCV: 95 fL (ref 80.0–100.0)
Monocytes Absolute: 1.7 10*3/uL — ABNORMAL HIGH (ref 0.1–1.0)
Monocytes Relative: 13 %
Neutro Abs: 8 10*3/uL — ABNORMAL HIGH (ref 1.7–7.7)
Neutrophils Relative %: 64 %
Platelets: 378 10*3/uL (ref 150–400)
RBC: 2.8 MIL/uL — ABNORMAL LOW (ref 4.22–5.81)
RDW: 17.9 % — ABNORMAL HIGH (ref 11.5–15.5)
WBC: 12.4 10*3/uL — ABNORMAL HIGH (ref 4.0–10.5)
nRBC: 0.2 % (ref 0.0–0.2)

## 2020-11-26 LAB — PROTIME-INR
INR: 1.1 (ref 0.8–1.2)
Prothrombin Time: 14.6 seconds (ref 11.4–15.2)

## 2020-11-26 LAB — COMPREHENSIVE METABOLIC PANEL
ALT: 16 U/L (ref 0–44)
AST: 16 U/L (ref 15–41)
Albumin: 2.9 g/dL — ABNORMAL LOW (ref 3.5–5.0)
Alkaline Phosphatase: 114 U/L (ref 38–126)
Anion gap: 7 (ref 5–15)
BUN: 24 mg/dL — ABNORMAL HIGH (ref 6–20)
CO2: 32 mmol/L (ref 22–32)
Calcium: 9 mg/dL (ref 8.9–10.3)
Chloride: 100 mmol/L (ref 98–111)
Creatinine, Ser: 0.9 mg/dL (ref 0.61–1.24)
GFR, Estimated: >60 mL/min (ref >60)
Glucose, Bld: 136 mg/dL — ABNORMAL HIGH (ref 70–99)
Potassium: 4 mmol/L (ref 3.5–5.1)
Sodium: 139 mmol/L (ref 135–145)
Total Bilirubin: 0.7 mg/dL (ref 0.3–1.2)
Total Protein: 6.5 g/dL (ref 6.5–8.1)

## 2020-11-26 LAB — PHOSPHORUS: Phosphorus: 3.7 mg/dL (ref 2.5–4.6)

## 2020-11-26 LAB — GLUCOSE, CAPILLARY
Glucose-Capillary: 120 mg/dL — ABNORMAL HIGH (ref 70–99)
Glucose-Capillary: 112 mg/dL — ABNORMAL HIGH (ref 70–99)
Glucose-Capillary: 114 mg/dL — ABNORMAL HIGH (ref 70–99)
Glucose-Capillary: 125 mg/dL — ABNORMAL HIGH (ref 70–99)

## 2020-11-26 LAB — MAGNESIUM: Magnesium: 2 mg/dL (ref 1.7–2.4)

## 2020-11-26 MED ORDER — CEFAZOLIN SODIUM-DEXTROSE 2-4 GM/100ML-% IV SOLN
INTRAVENOUS | Status: AC
  Administered 2020-11-26: 2 g via INTRAVENOUS
  Filled 2020-11-26: qty 100

## 2020-11-26 MED ORDER — MIDAZOLAM HCL 2 MG/2ML IJ SOLN
INTRAMUSCULAR | Status: AC
  Filled 2020-11-26: qty 6

## 2020-11-26 MED ORDER — FENTANYL CITRATE (PF) 100 MCG/2ML IJ SOLN
INTRAMUSCULAR | Status: DC | PRN
  Administered 2020-11-26 (×2): 50 ug via INTRAVENOUS

## 2020-11-26 MED ORDER — IOHEXOL 300 MG/ML  SOLN
20.0000 mL | Freq: Once | INTRAMUSCULAR | Status: AC | PRN
  Administered 2020-11-26: 20 mL

## 2020-11-26 MED ORDER — GLUCAGON HCL (RDNA) 1 MG IJ SOLR
INTRAMUSCULAR | Status: DC | PRN
  Administered 2020-11-26: 1 mg via INTRAVENOUS

## 2020-11-26 MED ORDER — TRAVASOL 10 % IV SOLN
INTRAVENOUS | Status: AC
  Filled 2020-11-26: qty 960

## 2020-11-26 MED ORDER — MIDAZOLAM HCL 2 MG/2ML IJ SOLN
INTRAMUSCULAR | Status: DC | PRN
  Administered 2020-11-26: 1 mg via INTRAVENOUS

## 2020-11-26 MED ORDER — GLUCAGON HCL RDNA (DIAGNOSTIC) 1 MG IJ SOLR
INTRAMUSCULAR | Status: AC
  Filled 2020-11-26: qty 1

## 2020-11-26 MED ORDER — LIDOCAINE HCL (PF) 1 % IJ SOLN
INTRAMUSCULAR | Status: AC
  Filled 2020-11-26: qty 30

## 2020-11-26 MED ORDER — FENTANYL CITRATE (PF) 100 MCG/2ML IJ SOLN
INTRAMUSCULAR | Status: AC
  Filled 2020-11-26: qty 2

## 2020-11-26 MED ORDER — INSULIN ASPART 100 UNIT/ML IJ SOLN
0.0000 [IU] | INTRAMUSCULAR | Status: DC
  Administered 2020-11-26: 1 [IU] via SUBCUTANEOUS

## 2020-11-26 MED ORDER — MIDAZOLAM HCL 2 MG/2ML IJ SOLN
INTRAMUSCULAR | Status: DC | PRN
  Administered 2020-11-26 (×4): 1 mg via INTRAVENOUS

## 2020-11-26 NOTE — Progress Notes (Signed)
PROGRESS NOTE  Frank Garrett  [id number] DOB: 1960/04/27 DOA: 11/14/2020 PCP: Tobie Lords D, FNP   Brief Narrative: Frank Garrett is a 60 y.o. male with a history of pancreatic CA s/p [place] 2018 and chemotherapy, breakthrough covid-19 infection Jan 2022 who presented to the ED 9/4 with nausea, vomiting due to small bowel obstruction which is felt to be due to adhesions from surgery vs. neoplastic invasion. Symptoms have improved dramatically with NG Tube, NPO status, and TPN has been started. GI attempt at endoscopic placement of stent at duodenum-jejunum junction on 9/13 was unsuccessful. the patient and wife have requested transfer to [facility] in [place]. I have initiated this transfer, have yet to hear from hospitalist.   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  - Continue TPN, started 9/10. K trending up, LFTs stable, insulin dosing appears good. - Continue NPO, NGT. Output from NG remains significant.  - IR consulted for PEG tube placement later today - Ultimate plan to follow up in 2 weeks outpatient at [facility] for possible stenting of stricture in the outpatient setting. - TPN to continue outpatient - Will need home health and close management to ensure appropriate nutrition until stenting can be attempted - Difficulty getting PCP/consult to follow TPN and labs which is delaying discharge. - Lengthy discussion about needing to focus on palliative/hospice should stenting be unsuccessful again.  Positive covid-19 PCR:  - Noted on 9/4. With cycle threshold of 33.6 and no attributable symptoms, this could be seen as a persistent (false) positive in an immunocompromised patient who had covid earlier this year - Isolation has been discontinued and the patient has remained asymptomatic from this perspective requiring no treatment.   History of PE (Aug 2022):  - No current hypoxia or evidence of RV overload.  - Restarted  anticoagulation post-EGD.    Metastatic pancreatic CA:  - Continue care through [facility].  - Continues to be difficult case given above SBO likely secondary to metastatic disease, initial plan for PEG tube placement for symptom management and drainage, continue TPN in the outpatient setting with home health with outpatient follow-up with GI at [facility] in 2 weeks for hopeful stenting of small bowel to alleviate symptoms and increase p.o. intake and nutrition. - Lengthy discussion today at bedside with patient and wife to set expectations given metastatic disease and possible issues if GI stent fails outpatient.  AKI, hypernatremia: Resolved.  Neutropenia, anemia: Polychromasia present. Had some schistocytes previously, none seen 9/13 and hgb stable at 8.8 g/dl. WBC has normalized.  RN Pressure Injury Documentation: Pressure Injury 11/22/20 Sacrum Lower Stage 2 -  Partial thickness loss of dermis presenting as a shallow open injury with a red, pink wound bed without slough. Pink, no drainage. Surrounding skin in tact. (Active)  11/22/20 0829  Location: Sacrum  Location Orientation: Lower  Staging: Stage 2 -  Partial thickness loss of dermis presenting as a shallow open injury with a red, pink wound bed without slough.  Wound Description (Comments): Pink, no drainage. Surrounding skin in tact.  Present on Admission: Yes   DVT prophylaxis: Therapeutic Lovenox Code Status: Full Family Communication: Family at bedside Disposition Plan:  Status is: Inpatient  Remains inpatient appropriate because:IV treatments appropriate due to intensity of illness or inability to take PO  Dispo: The patient is from: Home              Anticipated d/c is to: Likely Home pending TPN/PEG tube and  stabilization.             Patient currently is not medically stable to d/c.    Difficult to place patient No  Consultants:  General surgery GI Palliative care  Procedures:  Small bowel enteroscopy 11/23/2020  Dr. Clarene Essex:  Findings:      Localized mild inflammation characterized by linear erosions was found       on the greater curvature of the stomach. From previous NG trauma      Diffuse dilated small bowel was found in the duodenal bulb, in the first       portion of the duodenum, in the second portion of the duodenum, in the       third portion of the duodenum and in the fourth portion of the duodenum.      An extrinsic stenosis that was non-traversed was found in the proximal       jejunum. Estimated blood loss: None. A guide wire was inserted into the       jejunum and the endoscope was removed. The colonoscope was advanced over       the guide wire into the duodenum. Placement was confirmed by X-ray.       Unfortunately the wire was accidentally withdrawn back into the duodenum       and could not be readvanced past the obstruction estimated blood loss:       none. Impression:       - Caustic gastritis.                           - Dilated duodenum was found in the duodenum.                           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 Recommendation: - NPO indefinitely. Replace NG tube as needed will                            allow him to have at least a few hours without it                           - Repeat the small bowel enteroscopy at a                            university if he would like 1 more try and using  the side-viewing ERCP scope may be worthwhile.                           - Return to GI clinic PRN.                           - Telephone GI clinic if symptomatic PRN.                           - Refer to an interventional radiologist at                            appointment to  be scheduled if PEG placement is                            desired.  Antimicrobials: None   Subjective: No acute issues or events overnight, NG tube continues to drain dark green liquid, pending PEG tube placement - abdominal pain currently well controlled.  Objective: Vitals:   11/25/20 1600 11/25/20 1800 11/25/20 2048 11/26/20 0505  BP:  98/66 107/70 104/62  Pulse:   (!) 59 (!) 58  Resp: '20 14 14 16  '$ Temp:  99.5 F (37.5 C) 98.7 F (37.1 C) 98.1 F (36.7 C)  TempSrc:  Oral  Axillary  SpO2:  98% 100% 100%  Weight:      Height:        Intake/Output Summary (Last 24 hours) at 11/26/2020 0749 Last data filed at 11/26/2020 0600 Gross per 24 hour  Intake 1914.99 ml  Output 1675 ml  Net 239.99 ml    Filed Weights   11/14/20 1839 11/24/20 1300  Weight: 57.7 kg 57.5 kg   Gen: 60 y.o. male in no distress Pulm: Nonlabored breathing room air. Clear. CV: Regular rate and rhythm. No murmur, rub, or gallop. No JVD, no dependent edema. GI: Abdomen soft, non-tender, non-distended, with normoactive bowel sounds.  Ext: Warm, no deformities Skin: No new rashes, lesions or ulcers on visualized skin. Port site in left upper chest remains c/d/i Neuro: Alert and oriented. No focal neurological deficits. Psych: Judgement and insight appear fair. Mood euthymic & affect congruent. Behavior is appropriate.    Data Reviewed: I have personally reviewed following labs and imaging studies  CBC: Recent Labs  Lab 11/23/20 0722 11/26/20 0326  WBC 8.3 12.4*  NEUTROABS 4.5 8.0*  HGB 8.8* 8.2*  HCT 28.3* 26.6*  MCV 95.0 95.0  PLT 405* [id number]    Basic Metabolic Panel: Recent Labs  Lab 11/22/20 0331 11/23/20 0723 11/24/20 0352 11/25/20 0356 11/26/20 0326  NA 140 141 139 136 139  K 4.3 4.7 4.7 3.9 4.0  CL 102 105 104 98 100  CO2 '29 30 30 30 '$ 32  GLUCOSE 104* 114* 107* 152* 136*  BUN 18 22* 21* 18 24*  CREATININE 0.99 0.74 0.93 1.07 0.90  CALCIUM 9.5 9.6 9.4 9.1 9.0  MG 2.0 2.2 2.0  2.1 2.0  PHOS 4.1 3.5 3.4 4.3 3.7    GFR: Estimated Creatinine Clearance: 72.8 mL/min (by C-G formula based on SCr of 0.9 mg/dL). Liver Function Tests: Recent Labs  Lab 11/22/20 0331 11/23/20 0723 11/24/20 0352 11/25/20 0356 11/26/20 0326  AST '18 17 18 19 16  '$ ALT '24 21 19 18 16  '$ ALKPHOS 136* 136* 127* 124 114  BILITOT 0.6 0.3 0.5 0.4 0.7  PROT 6.7 6.8 6.8 6.5 6.5  ALBUMIN 3.0* 3.1* 2.9* 2.8* 2.9*    No results for input(s): LIPASE, AMYLASE in the last 168 hours. No results for input(s): AMMONIA in the last 168 hours. Coagulation Profile: Recent Labs  Lab 11/26/20 0326  INR 1.1   Cardiac Enzymes: No results for input(s): CKTOTAL, CKMB, CKMBINDEX, TROPONINI in the last 168 hours. BNP (last 3 results) No results for input(s): PROBNP in the last 8760 hours. HbA1C: No results for input(s): HGBA1C in the last 72 hours. CBG: Recent Labs  Lab 11/24/20 1712 11/24/20 2336 11/25/20 0725 11/25/20 1611 11/25/20 2354  GLUCAP 122* 164* 141* 125* 128*    Lipid Profile: No results for input(s): CHOL, HDL, LDLCALC, TRIG, CHOLHDL, LDLDIRECT in the last 72 hours.  Thyroid Function Tests: No results for input(s): TSH, T4TOTAL, FREET4, T3FREE, THYROIDAB in the last 72 hours. Anemia Panel: No results for input(s): VITAMINB12, FOLATE, FERRITIN, TIBC, IRON, RETICCTPCT in the last 72 hours. Urine analysis:    Component Value Date/Time   COLORURINE YELLOW (A) 11/14/2020 1650   APPEARANCEUR CLEAR (A) 11/14/2020 1650   LABSPEC 1.020 11/14/2020 1650   PHURINE 5.5 11/14/2020 1650   GLUCOSEU NEGATIVE 11/14/2020 1650   HGBUR NEGATIVE 11/14/2020 1650   BILIRUBINUR SMALL (A) 11/14/2020 1650   KETONESUR NEGATIVE 11/14/2020 1650   PROTEINUR TRACE (A) 11/14/2020 1650   UROBILINOGEN 2.0 (H) 08/20/2006 1249   NITRITE NEGATIVE 11/14/2020 1650   LEUKOCYTESUR NEGATIVE 11/14/2020 1650   No results found for this or any previous visit (from the past 240 hour(s)).     Radiology  Studies: No results found.  Scheduled Meds:  bisacodyl  10 mg Rectal Daily   Chlorhexidine Gluconate Cloth  6 each Topical Daily   enoxaparin (LOVENOX) injection  60 mg Subcutaneous Q12H   insulin aspart  0-9 Units Subcutaneous 4 times per day   lip balm  1 application Topical BID   sodium chloride flush  10-40 mL Intracatheter Q12H   Continuous Infusions:  sodium chloride 20 mL/hr at 11/24/20 1646   sodium chloride 10 mL/hr at 11/26/20 0000    ceFAZolin (ANCEF) IV     dextrose 5 % and 0.45% NaCl 10 mL/hr at 11/22/20 1902   lactated ringers Stopped (11/23/20 1526)   methocarbamol (ROBAXIN) IV 1,000 mg (11/17/20 1313)   ondansetron (ZOFRAN) IV     TPN ADULT (ION) 80 mL/hr at [id number]   TPN CYCLIC-ADULT (ION)       LOS: 12 days   Time spent: 35 minutes.  Little Ishikawa, DO  Pager: Secure chat  11/26/2020, 7:49 AM

## 2020-11-26 NOTE — Progress Notes (Signed)
   11/26/20 1500  Mobility  Activity Refused mobility   Refused secondary to recent ambulation with RN.    [facility] Acute Rehab Services Office: [phone]

## 2020-11-26 NOTE — Plan of Care (Signed)

## 2020-11-26 NOTE — Procedures (Signed)
Interventional Radiology Procedure Note  Date of Procedure: 11/26/2020  Procedure: Gastrostomy tube placement   Findings:  1. Successful placement of a 14 Fr percutaneous balloon gastrostomy tube    Complications: No immediate complications noted.   Estimated Blood Loss: minimal  Follow-up and Recommendations: 1. G tube may be used immediately for venting    Albin Felling, MD  Vascular & Interventional Radiology  11/26/2020 5:29 PM

## 2020-11-26 NOTE — TOC Progression Note (Signed)
Transition of Care [facility]) - Progression Note    Patient Details  Name: Sender Starr MRN: [id number] Date of Birth: [birth year]  [facility]) CM/SW Contact  Purcell Mouton, RN Phone Number: 11/26/2020, 4:32 PM  Clinical Narrative:    Pt will discharge home with [facility]. Waiting for MD to sign TPN OP/orders for home. Wife is aware and trying to reach out to MD in [place].    Expected Discharge Plan: [place] Barriers to Discharge: No Barriers Identified  Expected Discharge Plan and Services Expected Discharge Plan: [place] Choice: [place] arrangements for the past 2 months: Single Family Home                                       Social Determinants of Health (SDOH) Interventions    Readmission Risk Interventions No flowsheet data found.

## 2020-11-26 NOTE — Progress Notes (Signed)
[facility] Progress Note  Frank Garrett 60 y.o. 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   Subjective: Patient had enteroscopy with unsuccessful stenting of jejunal stenosis. Patient with continued NG tube placement, going for PEG with IR today at 12.   With NG tube in place patient denies AB pain, nausea, vomiting.  No fever, chills, shortness of breath or chest pain.   ROS : Review of Systems  Constitutional:  Negative for chills and fever.  Respiratory:  Negative for shortness of breath.   Cardiovascular:  Negative for chest pain and leg swelling.  Gastrointestinal:  Positive for diarrhea (loose stools normal for patient x 5 years). Negative for abdominal pain, blood in stool, constipation, nausea and vomiting.  Musculoskeletal:  Negative for falls.  Neurological:  Negative for focal weakness.  Psychiatric/Behavioral:  Negative for memory loss. The patient is not nervous/anxious.     Objective: Vital signs in last 24 hours: Vitals:   11/25/20 2048 11/26/20 0505  BP: 107/70 104/62  Pulse: (!) 59 (!) 58  Resp: 14 16  Temp: 98.7 F (37.1 C) 98.1 F (36.7 C)  SpO2: 100% 100%    Physical Exam: Physical Exam Constitutional:      General: He is not in acute distress.    Appearance: He is cachectic.     Comments: NGT in place  Eyes:     General: No scleral icterus.    Conjunctiva/sclera: Conjunctivae normal.  Cardiovascular:     Rate and Rhythm: Normal rate and regular rhythm.  Pulmonary:     Effort: Pulmonary effort is normal. No respiratory distress.  Abdominal:     General: There is no distension.     Palpations: Abdomen is soft.     Tenderness: There is no abdominal tenderness. There is no guarding or rebound.  Musculoskeletal:        General: Normal range of motion.  Skin:    Coloration: Skin is not jaundiced.  Neurological:     General: No focal deficit present.      Mental Status: He is oriented to person, place, and time.  Psychiatric:        Attention and Perception: Attention normal.        Mood and Affect: Mood normal. Mood is not depressed.        Speech: Speech normal.        Behavior: Behavior normal.     Lab Results: Recent Labs    11/25/20 0356 11/26/20 0326  NA 136 139  K 3.9 4.0  CL 98 100  CO2 30 32  GLUCOSE 152* 136*  BUN 18 24*  CREATININE 1.07 0.90  CALCIUM 9.1 9.0  MG 2.1 2.0  PHOS 4.3 3.7    Recent Labs    11/25/20 0356 11/26/20 0326  AST 19 16  ALT 18 16  ALKPHOS 124 114  BILITOT 0.4 0.7  PROT 6.5 6.5  ALBUMIN 2.8* 2.9*    Recent Labs    11/26/20 0326  WBC 12.4*  NEUTROABS 8.0*  HGB 8.2*  HCT 26.6*  MCV 95.0  PLT 378    Recent Labs    11/26/20 0326  LABPROT 14.6  INR 1.1    Lab Results: Results for orders placed or performed during the hospital encounter of 11/14/20 (from the past 48 hour(s))  Glucose, capillary     Status: Abnormal   Collection Time: 11/24/20  5:12 PM  Result Value Ref Range  Glucose-Capillary 122 (H) 70 - 99 mg/dL    Comment: Glucose reference range applies only to samples taken after fasting for at least 8 hours.  Glucose, capillary     Status: Abnormal   Collection Time: 11/24/20 11:36 PM  Result Value Ref Range   Glucose-Capillary 164 (H) 70 - 99 mg/dL    Comment: Glucose reference range applies only to samples taken after fasting for at least 8 hours.  Comprehensive metabolic panel     Status: Abnormal   Collection Time: 11/25/20  3:56 AM  Result Value Ref Range   Sodium 136 135 - 145 mmol/L   Potassium 3.9 3.5 - 5.1 mmol/L    Comment: DELTA CHECK NOTED   Chloride 98 98 - 111 mmol/L   CO2 30 22 - 32 mmol/L   Glucose, Bld 152 (H) 70 - 99 mg/dL    Comment: Glucose reference range applies only to samples taken after fasting for at least 8 hours.   BUN 18 6 - 20 mg/dL   Creatinine, Ser 1.07 0.61 - 1.24 mg/dL   Calcium 9.1 8.9 - 10.3 mg/dL   Total Protein 6.5 6.5  - 8.1 g/dL   Albumin 2.8 (L) 3.5 - 5.0 g/dL   AST 19 15 - 41 U/L   ALT 18 0 - 44 U/L   Alkaline Phosphatase 124 38 - 126 U/L   Total Bilirubin 0.4 0.3 - 1.2 mg/dL   GFR, Estimated >60 >60 mL/min    Comment: (NOTE) Calculated using the CKD-EPI Creatinine Equation (2021)    Anion gap 8 5 - 15    Comment: Performed at [facility], [place] [address] [place], [place] [address]  Magnesium     Status: None   Collection Time: 11/25/20  3:56 AM  Result Value Ref Range   Magnesium 2.1 1.7 - 2.4 mg/dL    Comment: Performed at [facility], [place] [address], [place], [place] [address]  Phosphorus     Status: None   Collection Time: 11/25/20  3:56 AM  Result Value Ref Range   Phosphorus 4.3 2.5 - 4.6 mg/dL    Comment: Performed at [facility], [place] [address], [place], [place] [address]  Glucose, capillary     Status: Abnormal   Collection Time: 11/25/20  7:25 AM  Result Value Ref Range   Glucose-Capillary 141 (H) 70 - 99 mg/dL    Comment: Glucose reference range applies only to samples taken after fasting for at least 8 hours.  Glucose, capillary     Status: Abnormal   Collection Time: 11/25/20  4:11 PM  Result Value Ref Range   Glucose-Capillary 125 (H) 70 - 99 mg/dL    Comment: Glucose reference range applies only to samples taken after fasting for at least 8 hours.  Glucose, capillary     Status: Abnormal   Collection Time: 11/25/20 11:54 PM  Result Value Ref Range   Glucose-Capillary 128 (H) 70 - 99 mg/dL    Comment: Glucose reference range applies only to samples taken after fasting for at least 8 hours.  Comprehensive metabolic panel     Status: Abnormal   Collection Time: 11/26/20  3:26 AM  Result Value Ref Range   Sodium 139 135 - 145 mmol/L   Potassium 4.0 3.5 - 5.1 mmol/L   Chloride 100 98 - 111 mmol/L   CO2 32 22 - 32 mmol/L   Glucose, Bld 136 (H) 70 - 99 mg/dL    Comment: Glucose  reference range applies only to samples  taken after fasting for at least 8 hours.   BUN 24 (H) 6 - 20 mg/dL   Creatinine, Ser 0.90 0.61 - 1.24 mg/dL   Calcium 9.0 8.9 - 10.3 mg/dL   Total Protein 6.5 6.5 - 8.1 g/dL   Albumin 2.9 (L) 3.5 - 5.0 g/dL   AST 16 15 - 41 U/L   ALT 16 0 - 44 U/L   Alkaline Phosphatase 114 38 - 126 U/L   Total Bilirubin 0.7 0.3 - 1.2 mg/dL   GFR, Estimated >60 >60 mL/min    Comment: (NOTE) Calculated using the CKD-EPI Creatinine Equation (2021)    Anion gap 7 5 - 15    Comment: Performed at [facility], [place] [address], [place], [place] [address]  Magnesium     Status: None   Collection Time: 11/26/20  3:26 AM  Result Value Ref Range   Magnesium 2.0 1.7 - 2.4 mg/dL    Comment: Performed at [facility], [place] [address], [place], [place] [address]  Phosphorus     Status: None   Collection Time: 11/26/20  3:26 AM  Result Value Ref Range   Phosphorus 3.7 2.5 - 4.6 mg/dL    Comment: Performed at [facility], [place] [address], [place], [place] [address]  CBC with Differential/Platelet     Status: Abnormal   Collection Time: 11/26/20  3:26 AM  Result Value Ref Range   WBC 12.4 (H) 4.0 - 10.5 K/uL   RBC 2.80 (L) 4.22 - 5.81 MIL/uL   Hemoglobin 8.2 (L) 13.0 - 17.0 g/dL   HCT 26.6 (L) 39.0 - 52.0 %   MCV 95.0 80.0 - 100.0 fL   MCH 29.3 26.0 - 34.0 pg   MCHC 30.8 30.0 - 36.0 g/dL   RDW 17.9 (H) 11.5 - 15.5 %   Platelets 378 150 - 400 K/uL   nRBC 0.2 0.0 - 0.2 %   Neutrophils Relative % 64 %   Neutro Abs 8.0 (H) 1.7 - 7.7 K/uL   Lymphocytes Relative 13 %   Lymphs Abs 1.6 0.7 - 4.0 K/uL   Monocytes Relative 13 %   Monocytes Absolute 1.7 (H) 0.1 - 1.0 K/uL   Eosinophils Relative 1 %   Eosinophils Absolute 0.1 0.0 - 0.5 K/uL   Basophils Relative 1 %   Basophils Absolute 0.1 0.0 - 0.1 K/uL   WBC Morphology MILD LEFT SHIFT (1-5% METAS, OCC MYELO, OCC BANDS)    Immature Granulocytes 8 %   Abs Immature Granulocytes 0.94 (H) 0.00 - 0.07 K/uL    Polychromasia PRESENT    Target Cells PRESENT     Comment: Performed at [facility], [place] [address] [place], [place] [address]  Protime-INR     Status: None   Collection Time: 11/26/20  3:26 AM  Result Value Ref Range   Prothrombin Time 14.6 11.4 - 15.2 seconds   INR 1.1 0.8 - 1.2    Comment: (NOTE) INR goal varies based on device and disease states. Performed at [facility], [place] [address] [place], [place] [address]   Glucose, capillary     Status: Abnormal   Collection Time: 11/26/20  7:59 AM  Result Value Ref Range   Glucose-Capillary 120 (H) 70 - 99 mg/dL    Comment: Glucose reference range applies only to samples taken after fasting for at least 8 hours.    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 Unsuccessful stenting of jejunal obstruction on 11/23/2020 History of pulmonary embolism -currently on full dose Lovenox  Plan: Plan is gastrotomy placement with IR today, will be discharged n.p.o., and continued TPN with PICC line. Patient would like referral to [facility] for possible second attempt enteroscopy with jejunal stent placement with Dr. Lysle Rubens. Will send all records to fax [phone] Attn Jearl [facility]line[facility] will sign off. Please contact us if we can be of any further assistance during this hospital stay.   Vladimir Crofts PA-C 11/26/2020, 10:33 AM

## 2020-11-26 NOTE — Progress Notes (Signed)
PHARMACY - TOTAL PARENTERAL NUTRITION CONSULT NOTE   Indication: Small bowel obstruction  Patient Measurements: Height: '5\' 8"'$  (172.7 cm) Weight: 57.5 kg (126 lb 12.2 oz) IBW/kg (Calculated) : 68.4 TPN AdjBW (KG): 57.7 Body mass index is 19.27 kg/m.  Assessment: 60 yo male with metastatic pancreatic cancer followed by [facility] [place] [facility] [place] now with continued SBO due to tumor to start TPN.   Glucose / Insulin: CBGs since TPN started at goal < 180: range 128-141 - 3 units SSI required/24hr Electrolytes: All WNL - Phos improved after decreasing concentration in TPN yesterday Renal: SCr wnl, BUN up slightly, UOP 461m recorded (?accuracy) Hepatic: AST/ALT WNL, Alb 2.9 TG: 85 (9/10), 68 (9/12)  Intake / Output; MIVF: I/O inaccurate, currently on  NS 10 ml/hr - 12737mNGT output, no BM recorded in past 24hr - previous loose stools noted GI Imaging: GI Surgeries / Procedures:  9/13: unsuccessful attempt at duodenal/jejunal stent placement 9/16: plan IR placement of PEG tube for symptom management/venting  Central access: patient already with Port TPN start date: 9/10 9/14-RN called at 0330 to report patient pulled TPN line and it was disconnected from portacath then discarded; D10 80 ml/hr IV entered.  9/15 TPN restarted at goal rate, no interruptions noted   Nutritional Goals: Goal TPN provides 96 g of protein and 1939 kcals per day  RD Assessment: Estimated Needs Total Energy Estimated Needs: 1800-2000 Total Protein Estimated Needs: 90-105g Total Fluid Estimated Needs: 2L/day  Current Nutrition:  NPO  Plan:  Adjust TPN to cyclic administration in preparation for discharge over 18hr starting at 18:00 tonight: 5660mr x 1hr, 113m69m x 16hr, 56ml84mx 1hr, then off x 6hr  Electrolytes in TPN:  Na 100mEq31mK 30mEq/60m 5mEq/L 62m 5mEq/L  45ms 5mmol/L  92mAc 1:2  Add standard MVI and trace elements to TPN Adjust Sensitive SSI to 4x/day - 2hr post initiation,  1hr after discontinuation, once in the middle of infusion and once while off infusion [facility] on Mon/Thurs, and as needed  Maragret Vanacker WilliPeggyann JubaBCPS Pharmacy: [phone] 6:58 AM

## 2020-11-27 LAB — COMPREHENSIVE METABOLIC PANEL
ALT: 15 U/L (ref 0–44)
AST: 18 U/L (ref 15–41)
Albumin: 2.7 g/dL — ABNORMAL LOW (ref 3.5–5.0)
Alkaline Phosphatase: 110 U/L (ref 38–126)
Anion gap: 8 (ref 5–15)
BUN: 28 mg/dL — ABNORMAL HIGH (ref 6–20)
CO2: 30 mmol/L (ref 22–32)
Calcium: 8.9 mg/dL (ref 8.9–10.3)
Chloride: 101 mmol/L (ref 98–111)
Creatinine, Ser: 0.89 mg/dL (ref 0.61–1.24)
GFR, Estimated: >60 mL/min (ref >60)
Glucose, Bld: 139 mg/dL — ABNORMAL HIGH (ref 70–99)
Potassium: 4.1 mmol/L (ref 3.5–5.1)
Sodium: 139 mmol/L (ref 135–145)
Total Bilirubin: 0.7 mg/dL (ref 0.3–1.2)
Total Protein: 6.1 g/dL — ABNORMAL LOW (ref 6.5–8.1)

## 2020-11-27 LAB — GLUCOSE, CAPILLARY
Glucose-Capillary: 119 mg/dL — ABNORMAL HIGH (ref 70–99)
Glucose-Capillary: 130 mg/dL — ABNORMAL HIGH (ref 70–99)
Glucose-Capillary: 136 mg/dL — ABNORMAL HIGH (ref 70–99)
Glucose-Capillary: 87 mg/dL (ref 70–99)

## 2020-11-27 LAB — MAGNESIUM: Magnesium: 1.9 mg/dL (ref 1.7–2.4)

## 2020-11-27 LAB — PHOSPHORUS: Phosphorus: 2.9 mg/dL (ref 2.5–4.6)

## 2020-11-27 MED ORDER — TRAVASOL 10 % IV SOLN
INTRAVENOUS | Status: AC
Start: 2020-11-27 — End: 2020-11-28
  Filled 2020-11-27: qty 960

## 2020-11-27 MED ORDER — INSULIN ASPART 100 UNIT/ML IJ SOLN
0.0000 [IU] | INTRAMUSCULAR | Status: DC
  Administered 2020-11-27 – 2020-11-28 (×2): 1 [IU] via SUBCUTANEOUS

## 2020-11-27 MED ORDER — NYSTATIN 100000 UNIT/ML MT SUSP
5.0000 mL | Freq: Four times a day (QID) | OROMUCOSAL | Status: DC
  Administered 2020-11-27 – 2020-11-29 (×7): 500000 [IU] via ORAL
  Filled 2020-11-27 (×7): qty 5

## 2020-11-27 NOTE — Progress Notes (Signed)
   11/27/20 0626  Vitals  Temp 99.2 F (37.3 C)  Temp Source Oral  No interventions at this time.

## 2020-11-27 NOTE — Progress Notes (Signed)
PHARMACY - TOTAL PARENTERAL NUTRITION CONSULT NOTE   Indication: Small bowel obstruction  Patient Measurements: Height: '5\' 8"'$  (172.7 cm) Weight: 57.5 kg (126 lb 12.2 oz) IBW/kg (Calculated) : 68.4 TPN AdjBW (KG): 57.7 Body mass index is 19.27 kg/m.  Assessment: 60 yo male with metastatic pancreatic cancer followed by [facility] [place] [facility] [place] now with continued SBO due to tumor to start TPN.   Glucose / Insulin: CBGs since TPN started at goal < 180: range 125-136 while TPN infusing - 1 units SSI required/24hr Electrolytes: All WNL - Phos wnl but trending down after decreasing concentration in TPN 9/15, Mg < 2 Renal: SCr wnl, BUN up slightly 28, UOP 450m recorded (?accuracy) Hepatic: AST/ALT WNL, Alb 2.7 TG: 85 (9/10), 68 (9/12)  Intake / Output; MIVF: I/O inaccurate - 100 NGT output, no BM recorded in past 24hr - previous hx loose stools noted GI Imaging: GI Surgeries / Procedures:  9/13: unsuccessful attempt at duodenal/jejunal stent placement 9/16: IR placement of PEG tube for symptom management/venting  Central access: patient already with Port TPN start date: 9/10 9/14-RN called at 0330 to report patient pulled TPN line and it was disconnected from portacath then discarded; D10 80 ml/hr IV entered.  9/15 TPN restarted at goal rate, no interruptions noted  [id number] to cyclic administration over 18hr.  [facility] aware.  Nutritional Goals: Goal TPN provides 96 g of protein and 1939 kcals per day  RD Assessment: Estimated Needs Total Energy Estimated Needs: 1800-2000 Total Protein Estimated Needs: 90-105g Total Fluid Estimated Needs: 2L/day  Current Nutrition:  NPO  Plan:  Adjust TPN to cyclic administration in preparation for discharge over 16hr starting at 18:00 tonight: 651mhr x 1hr, 12824mr x 16hr, 56m54m x 1hr, then off x 8hr  Electrolytes in TPN:  Na 100mE65m K 30mEq43ma 5mEq/L52mg 6mEq/L 12mcrease) Phos 7.5mmol/L 63mcrease) Cl:Ac  1:2  Add standard MVI and trace elements to TPN Adjust Sensitive SSI to 4x/day - 2hr post initiation, 1hr after discontinuation, once in the middle of infusion and once while off infusion IVF per MD - none currently [facility] Monitor TPN labs on Mon/Thurs, and as needed  Kaylen Nghiem WillPeggyann Juba BCPS Pharmacy: [phone] 8:09 AM

## 2020-11-27 NOTE — Progress Notes (Signed)
   11/27/20 0508  Vitals  Temp 100.3 F (37.9 C)  Temp Source Oral  Pt is aware of temperature.  Pt informed that we could give tylenol although it will be rectal due to his NPO status.  Pt wants to wait 1 hour and then retake temp.

## 2020-11-27 NOTE — Progress Notes (Signed)
PROGRESS NOTE  Frank Garrett  [id number] DOB: 06/09/1960 DOA: 11/14/2020 PCP: Tobie Lords D, FNP   Brief Narrative: Frank Garrett is a 60 y.o. male with a history of pancreatic CA s/p [place] 2018 and chemotherapy, breakthrough covid-19 infection Jan 2022 who presented to the ED 9/4 with nausea, vomiting due to small bowel obstruction which is felt to be due to adhesions from surgery vs. neoplastic invasion. Symptoms have improved dramatically with NG Tube, NPO status, and TPN has been started. GI attempt at endoscopic placement of stent at duodenum-jejunum junction on 9/13 was unsuccessful. the patient and wife have requested transfer to [facility] in [place]. I have initiated this transfer, have yet to hear from hospitalist.   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  - Continue TPN, started 9/10. K trending up, LFTs stable, insulin dosing appears good. - Continue to advance to full liquids as tolerated, unlikely to be to tolerate any meaningful nutrition p.o. but continue to attempt as tolerated with full liquids -Status post PEG tube placement 11/26/2020, successful, goal today and this weekend is to disconnect patient from intermittent suction and attempt to practice at home tube drainage and care - Ultimate plan to follow up in 2 weeks outpatient at [facility] for possible stenting of stricture in the outpatient setting. - TPN to continue outpatient - Will need home health and close management to ensure appropriate nutrition until stenting can be attempted - Difficulty getting PCP/consult to follow TPN and labs which is delaying discharge. - Lengthy discussion about needing to focus on palliative/hospice should stenting be unsuccessful again.  Positive covid-19 PCR, status post quarantine:  - Noted on 9/4. With cycle threshold of 33.6 and no attributable symptoms, this could be seen as a persistent (false) positive in an  immunocompromised patient who had covid earlier this year - Isolation has been discontinued   History of PE (Aug 2022):  - No current hypoxia or evidence of RV overload.  - Restarted anticoagulation post-EGD. -Continue Lovenox, hopefully able to transition back to Xarelto if able to tolerate p.o. otherwise will need to discharge on Lovenox  Metastatic pancreatic CA:  - Continue care through [facility].  - Continues to be difficult case given above SBO likely secondary to metastatic disease, initial plan for PEG tube placement for symptom management and drainage, continue TPN in the outpatient setting with home health with outpatient follow-up with GI at [facility] in 2 weeks for hopeful stenting of small bowel to alleviate symptoms and increase p.o. intake and nutrition. - Lengthy discussion today at bedside with patient and wife to set expectations given metastatic disease and possible issues if GI stent fails outpatient.  AKI, hypernatremia: Resolved.  Neutropenia, anemia: Polychromasia present. Had some schistocytes previously, none seen 9/13 and hgb stable at 8.8 g/dl. WBC has normalized.  RN Pressure Injury Documentation: Pressure Injury 11/22/20 Sacrum Lower Stage 2 -  Partial thickness loss of dermis presenting as a shallow open injury with a red, pink wound bed without slough. Pink, no drainage. Surrounding skin in tact. (Active)  11/22/20 0829  Location: Sacrum  Location Orientation: Lower  Staging: Stage 2 -  Partial thickness loss of dermis presenting as a shallow open injury with a red, pink wound bed without slough.  Wound Description (Comments): Pink, no drainage. Surrounding skin in tact.  Present on Admission: Yes   DVT prophylaxis: Therapeutic Lovenox Code Status: Full Family Communication: Family at bedside Disposition Plan:  Status is: Inpatient  Remains inpatient appropriate because:IV treatments appropriate due to intensity of illness or inability to take PO  Dispo:  The patient is from: Home              Anticipated d/c is to: Likely Home pending TPN/PEG tube and stabilization.             Patient currently is not medically stable to d/c.    Difficult to place patient No  Consultants:  General surgery GI Palliative care  Procedures:  Small bowel enteroscopy 11/23/2020 Dr. Clarene Essex:  Findings:      Localized mild inflammation characterized by linear erosions was found       on the greater curvature of the stomach. From previous NG trauma      Diffuse dilated small bowel was found in the duodenal bulb, in the first       portion of the duodenum, in the second portion of the duodenum, in the       third portion of the duodenum and in the fourth portion of the duodenum.      An extrinsic stenosis that was non-traversed was found in the proximal       jejunum. Estimated blood loss: None. A guide wire was inserted into the       jejunum and the endoscope was removed. The colonoscope was advanced over       the guide wire into the duodenum. Placement was confirmed by X-ray.       Unfortunately the wire was accidentally withdrawn back into the duodenum       and could not be readvanced past the obstruction estimated blood loss:       none. Impression:       - Caustic gastritis.                           - Dilated duodenum was found in the duodenum.                           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 Recommendation: - NPO indefinitely. Replace NG tube as needed will                            allow him to have at least a few hours without it                           - [facility] the small bowel enteroscopy [facility] a  [facility] if he would like 1 more try and  using                            the side-viewing ERCP scope may be worthwhile.                           - Return to GI clinic PRN.                           - Telephone GI clinic if symptomatic PRN.                           - Refer to an interventional radiologist at                            appointment to be scheduled if PEG placement is                            desired.  Antimicrobials: None   Subjective: No acute issues or events overnight, tolerated PEG tube placement well, looking forward to more freedom as PEG tube is to be managed by him and his wife today disconnected from intermittent suction  Objective: Vitals:   11/26/20 1740 11/26/20 2013 11/27/20 0508 11/27/20 0626  BP: 97/68 103/66 (!) 95/59   Pulse:  (!) 59 70   Resp: 18 18    Temp: 98.1 F (36.7 C) 98.8 F (37.1 C) 100.3 F (37.9 C) 99.2 F (37.3 C)  TempSrc: Oral Oral Oral Oral  SpO2: 100% 100% 100%   Weight:      Height:        Intake/Output Summary (Last 24 hours) at 11/27/2020 0752 Last data filed at 11/27/2020 0511 Gross per 24 hour  Intake 2019.08 ml  Output 575 ml  Net 1444.08 ml    Filed Weights   11/14/20 1839 11/24/20 1300  Weight: 57.7 kg 57.5 kg   Gen: 60 y.o. male in no distress Pulm: Nonlabored breathing room air. Clear. CV: Regular rate and rhythm. No murmur, rub, or gallop. No JVD, no dependent edema. GI: Abdomen soft, non-tender, non-distended, with normoactive bowel sounds.  PEG tube site clean dry intact Ext: Warm, no deformities Skin: No new rashes, lesions or ulcers on visualized skin. Port site in left upper chest remains c/d/i Neuro: Alert and oriented. No focal neurological deficits. Psych: Judgement and insight appear fair. Mood euthymic & affect congruent. Behavior is appropriate.    Data Reviewed: I have personally reviewed following labs and imaging studies  CBC: Recent Labs  Lab 11/23/20 0722 11/26/20 0326  WBC 8.3 12.4*  NEUTROABS 4.5 8.0*  HGB 8.8* 8.2*   HCT 28.3* 26.6*  MCV 95.0 95.0  PLT 405* [id number]    Basic Metabolic Panel: Recent Labs  Lab 11/23/20 0723 11/24/20 0352 11/25/20 0356 11/26/20 0326 11/27/20 0334  NA 141 139 136 139 139  K 4.7 4.7 3.9 4.0 4.1  CL 105 104 98 100 101  CO2 '30 30 30 '$ 32 30  GLUCOSE 114* 107* 152* 136* 139*  BUN 22* 21* 18 24* 28*  CREATININE 0.74 0.93 1.07 0.90 0.89  CALCIUM 9.6 9.4 9.1 9.0 8.9  MG 2.2 2.0 2.1 2.0 1.9  PHOS 3.5 3.4 4.3 3.7 2.9  GFR: Estimated Creatinine Clearance: 73.6 mL/min (by C-G formula based on SCr of 0.89 mg/dL). Liver Function Tests: Recent Labs  Lab 11/23/20 0723 11/24/20 0352 11/25/20 0356 11/26/20 0326 11/27/20 0334  AST '17 18 19 16 18  '$ ALT '21 19 18 16 15  '$ ALKPHOS 136* 127* 124 114 110  BILITOT 0.3 0.5 0.4 0.7 0.7  PROT 6.8 6.8 6.5 6.5 6.1*  ALBUMIN 3.1* 2.9* 2.8* 2.9* 2.7*    No results for input(s): LIPASE, AMYLASE in the last 168 hours. No results for input(s): AMMONIA in the last 168 hours. Coagulation Profile: Recent Labs  Lab 11/26/20 0326  INR 1.1    Cardiac Enzymes: No results for input(s): CKTOTAL, CKMB, CKMBINDEX, TROPONINI in the last 168 hours. BNP (last 3 results) No results for input(s): PROBNP in the last 8760 hours. HbA1C: No results for input(s): HGBA1C in the last 72 hours. CBG: Recent Labs  Lab 11/26/20 0759 11/26/20 1151 11/26/20 1740 11/26/20 2010 11/27/20 0022  GLUCAP 120* 112* 114* 125* 119*    Lipid Profile: No results for input(s): CHOL, HDL, LDLCALC, TRIG, CHOLHDL, LDLDIRECT in the last 72 hours.  Thyroid Function Tests: No results for input(s): TSH, T4TOTAL, FREET4, T3FREE, THYROIDAB in the last 72 hours. Anemia Panel: No results for input(s): VITAMINB12, FOLATE, FERRITIN, TIBC, IRON, RETICCTPCT in the last 72 hours. Urine analysis:    Component Value Date/Time   COLORURINE YELLOW (A) 11/14/2020 1650   APPEARANCEUR CLEAR (A) 11/14/2020 1650   LABSPEC 1.020 11/14/2020 1650   PHURINE 5.5 11/14/2020 1650    GLUCOSEU NEGATIVE 11/14/2020 1650   HGBUR NEGATIVE 11/14/2020 1650   BILIRUBINUR SMALL (A) 11/14/2020 1650   KETONESUR NEGATIVE 11/14/2020 1650   PROTEINUR TRACE (A) 11/14/2020 1650   UROBILINOGEN 2.0 (H) 08/20/2006 1249   NITRITE NEGATIVE 11/14/2020 1650   LEUKOCYTESUR NEGATIVE 11/14/2020 1650   No results found for this or any previous visit (from the past 240 hour(s)).     Radiology Studies: IR GASTROSTOMY TUBE MOD SED  Result Date: 11/26/2020 INDICATION: Obstruction, gastrostomy tube needed for venting EXAM: Placement of a percutaneous gastrostomy tube using fluoroscopic guidance MEDICATIONS: Ancef 2 g; Antibiotics were administered within 1 hour of the procedure. Glucagon 1 mg IV ANESTHESIA/SEDATION: Versed 6 mg IV; Fentanyl 100 mcg IV Moderate Sedation Time:  31 The patient was continuously monitored during the procedure by the interventional radiology nurse under my direct supervision. CONTRAST:  20 mL Omnipaque 300-administered into the gastric lumen. FLUOROSCOPY TIME:  Fluoroscopy Time: 4 minutes 12 seconds (12 mGy). COMPLICATIONS: None immediate. PROCEDURE: Informed written consent was obtained from the patient after a thorough discussion of the procedural risks, benefits and alternatives. All questions were addressed. Maximal Sterile Barrier Technique was utilized including caps, mask, sterile gowns, sterile gloves, sterile drape, hand hygiene and skin antiseptic. A timeout was performed prior to the initiation of the procedure. Review of pre-procedure CT scan demonstrates an adequate window for percutaneous placement of a gastrostomy tube. The patient was placed supine on the exam table. Using the pre-existing nasogastric tube, the stomach was insufflated with air. The abdomen was prepped and draped in the standard sterile fashion. After insufflating the stomach with air, puncture sites were selected and local analgesia was obtained with 1% lidocaine. Using fluoroscopic guidance, a  gastropexy needle was advanced into the stomach and the T-bar suture was released. Entry into the stomach was confirmed with fluoroscopy, aspiration of air, and injection of contrast material. This was repeated with an additional gastropexy suture (for a total  of 2 fasteners). At the center of these gastropexy sutures, a dermatotomy was performed. An 18 gauge needle was then passed into the stomach, and position within the gastric lumen again confirmed under fluoroscopy using aspiration of air and injection of contrast material. An Amplatz guidewire was passed through this needle and intraluminal placement was confirmed with fluoroscopy. The needle was removed, and over the guidewire, a 14 French balloon gastrostomy tube with a coaxial 10 mm balloon was advanced into the percutaneous tract. Dilation of the percutaneous tract was then performed using the balloon, followed by advancement of the gastrostomy tube into the gastric lumen. The retention balloon was then inflated with 7 mL of sterile water, and the tube was brought back to the gastric wall. The wire and balloon were removed. The external bumper was brought to the skin. Location of the gastrostomy tube within the stomach was then confirmed with injection of contrast material, opacifying the gastric lumen. The gastrostomy tube was flushed with sterile water, and secured to the skin using a dressing. The patient tolerated the procedure well without immediate complication, and was transferred to recovery in stable condition. IMPRESSION: Successful placement of a 31 French percutaneous balloon gastrostomy tube. The gastrostomy tube may be used immediately for venting. Electronically Signed   By: Albin Felling M.D.   On: 11/26/2020 17:28    Scheduled Meds:  bisacodyl  10 mg Rectal Daily   Chlorhexidine Gluconate Cloth  6 each Topical Daily   enoxaparin (LOVENOX) injection  60 mg Subcutaneous Q12H   insulin aspart  0-9 Units Subcutaneous 4 times per day    lip balm  1 application Topical BID   sodium chloride flush  10-40 mL Intracatheter Q12H   Continuous Infusions:  sodium chloride 20 mL/hr at 11/24/20 1646   sodium chloride Stopped (11/26/20 1630)   dextrose 5 % and 0.45% NaCl 10 mL/hr at 11/22/20 1902   lactated ringers Stopped (11/23/20 1526)   methocarbamol (ROBAXIN) IV 1,000 mg (11/17/20 1313)   ondansetron (ZOFRAN) IV     TPN CYCLIC-ADULT (ION) 123456 mL/hr at 11/26/20 1924     LOS: 13 days   Time spent: 35 minutes.  Little Ishikawa, DO  Pager: Secure chat  11/27/2020, 7:52 AM

## 2020-11-28 LAB — COMPREHENSIVE METABOLIC PANEL
ALT: 63 U/L — ABNORMAL HIGH (ref 0–44)
AST: 51 U/L — ABNORMAL HIGH (ref 15–41)
Albumin: 2.8 g/dL — ABNORMAL LOW (ref 3.5–5.0)
Alkaline Phosphatase: 149 U/L — ABNORMAL HIGH (ref 38–126)
Anion gap: 7 (ref 5–15)
BUN: 30 mg/dL — ABNORMAL HIGH (ref 6–20)
CO2: 31 mmol/L (ref 22–32)
Calcium: 8.8 mg/dL — ABNORMAL LOW (ref 8.9–10.3)
Chloride: 101 mmol/L (ref 98–111)
Creatinine, Ser: 0.81 mg/dL (ref 0.61–1.24)
GFR, Estimated: >60 mL/min (ref >60)
Glucose, Bld: 115 mg/dL — ABNORMAL HIGH (ref 70–99)
Potassium: 4 mmol/L (ref 3.5–5.1)
Sodium: 139 mmol/L (ref 135–145)
Total Bilirubin: 0.5 mg/dL (ref 0.3–1.2)
Total Protein: 6.2 g/dL — ABNORMAL LOW (ref 6.5–8.1)

## 2020-11-28 LAB — GLUCOSE, CAPILLARY
Glucose-Capillary: 126 mg/dL — ABNORMAL HIGH (ref 70–99)
Glucose-Capillary: 113 mg/dL — ABNORMAL HIGH (ref 70–99)
Glucose-Capillary: 100 mg/dL — ABNORMAL HIGH (ref 70–99)
Glucose-Capillary: 79 mg/dL (ref 70–99)
Glucose-Capillary: 131 mg/dL — ABNORMAL HIGH (ref 70–99)

## 2020-11-28 LAB — MAGNESIUM: Magnesium: 2 mg/dL (ref 1.7–2.4)

## 2020-11-28 LAB — PHOSPHORUS: Phosphorus: 3.9 mg/dL (ref 2.5–4.6)

## 2020-11-28 MED ORDER — INSULIN ASPART 100 UNIT/ML IJ SOLN
0.0000 [IU] | INTRAMUSCULAR | Status: DC
  Administered 2020-11-28: 1 [IU] via SUBCUTANEOUS

## 2020-11-28 MED ORDER — BACITRACIN-NEOMYCIN-POLYMYXIN 400-5-5000 EX OINT: TOPICAL_OINTMENT | Freq: Every day | CUTANEOUS | Status: DC

## 2020-11-28 MED ORDER — BACITRACIN-NEOMYCIN-POLYMYXIN OINTMENT TUBE
TOPICAL_OINTMENT | Freq: Every day | CUTANEOUS | Status: DC
  Filled 2020-11-28: qty 14.17

## 2020-11-28 MED ORDER — MAGNESIUM FOR TPN
INJECTION | INTRAVENOUS | Status: AC
Start: 2020-11-28 — End: 2020-11-29
  Filled 2020-11-28: qty 1056

## 2020-11-28 NOTE — Plan of Care (Signed)

## 2020-11-28 NOTE — Progress Notes (Signed)
PHARMACY - TOTAL PARENTERAL NUTRITION CONSULT NOTE   Indication: Small bowel obstruction  Patient Measurements: Height: '5\' 8"'$  (172.7 cm) Weight: 57.5 kg (126 lb 12.2 oz) IBW/kg (Calculated) : 68.4 TPN AdjBW (KG): 57.7 Body mass index is 19.27 kg/m.  Assessment: 60 yo male with metastatic pancreatic cancer followed by [facility] [place] [facility] [place] now with continued SBO due to tumor to start TPN.   Glucose / Insulin: CBGs since TPN started at goal < 180 - range 113-130 while TPN infusing, 87-98 while TPN off - 2 units SSI required/24hr Electrolytes: All WNL Renal: SCr wnl, BUN slowly trending up 30, UOP 267m recorded (?accuracy) 1 occurrence charted yesterday Hepatic: AST/ALT now trending up 51/63, Alb 2.8 TG: 85 (9/10), 68 (9/12)  Intake / Output; MIVF: I/O inaccurate - no NGT output recorded, 1 BM recorded in past 24hr - chronic hx loose stools noted GI Imaging: GI Surgeries / Procedures:  9/13: unsuccessful attempt at duodenal/jejunal stent placement 9/16: IR placement of PEG tube for symptom management/venting  Central access: patient already with Port TPN start date: 9/10 9/14-RN called at 0330 to report patient pulled TPN line and it was disconnected from portacath then discarded; D10 80 ml/hr IV entered.  9/15 TPN restarted at goal rate, no interruptions noted  [id number] to cyclic administration over 18hr.  [facility] aware. [id number] cyclic administration over 16hr.  Nutritional Goals: Goal TPN provides 105 g of protein and 1847 kcals per day   RD Assessment: Estimated Needs Total Energy Estimated Needs: 1800-2000 Total Protein Estimated Needs: 90-105g Total Fluid Estimated Needs: 2L/day  Current Nutrition:  NPO  Plan:  Adjust TPN to cyclic administration in preparation for discharge over 14hr starting at 18:00 tonight: 758mhr x 1hr, 14852mr x 12hr, 65m56m x 1hr, then off x 10hr  Electrolytes in TPN:  Na 100mE16m K 30mEq16ma 5mEq/L64mMg 6mEq/L 31mos 7.5mmol/L 35mAc 1:2  Add standard MVI and trace elements to TPN Adjust Sensitive SSI to 4x/day - 2hr post initiation, 1hr after discontinuation, once in the middle of infusion and once while off infusion IVF per MD - none currently ([facility] Monitor TPN labs on Mon/Thurs, and as needed  Raechell Singleton WillPeggyann Juba BCPS Pharmacy: [phone] 8:09 AM

## 2020-11-28 NOTE — Progress Notes (Signed)
PROGRESS NOTE  Frank Garrett  [id number] DOB: May 02, 1960 DOA: 11/14/2020 PCP: Tobie Lords D, FNP   Brief Narrative: Frank Garrett is a 60 y.o. male with a history of pancreatic CA s/p [place] 2018 and chemotherapy, breakthrough covid-19 infection Jan 2022 who presented to the ED 9/4 with nausea, vomiting due to small bowel obstruction which is felt to be due to adhesions from surgery vs. neoplastic invasion. Symptoms have improved dramatically with NG Tube, NPO status, and TPN has been started. GI attempt at endoscopic placement of stent at duodenum-jejunum junction on 9/13 was unsuccessful.  Initially requesting transfer to [facility] in [place] however given PEG tube placement and plan for outpatient enteroscopy at [facility] for stent placement in the next 2 weeks no longer requiring or requesting transfer to outside facility, we discussed this would be a lateral transfer as nothing was being offered at the [facility] facility that cannot be done in our system.  At this time tentative plan is discharge home with PEG tube for venting, tolerating p.o. liquids as able until definitive outpatient follow-up with [facility] for GI stenting. Unfortunately there is some complication with having outside physician sign for TPN orders and to follow labs outpatient which cannot be done by our hospitalist group.  Hoping that patient's PCP will be able to accept this responsibility as early as Monday, 11/29/2020, pending TPN outpatient orders patient is otherwise medically stable and agreeable for discharge home.  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  - Continue TPN, started 9/10 -will need outpatient monitoring -Successful PEG tube placement 11/26/2020 with goals for further education on how to use this tube as a venting G-tube until GI stent can be attempted with [facility] outpatient GI - TPN to continue outpatient - Will need home health and close  management to ensure appropriate nutrition until stenting can be attempted - Difficulty getting PCP/consult to follow TPN and labs which is delaying discharge - Lengthy discussion about needing to focus on palliative/hospice should stenting be unsuccessful again.  Positive covid-19 PCR, status post quarantine:  - Noted on 9/4. With cycle threshold of 33.6 and no attributable symptoms, this could be seen as a persistent (false) positive in an immunocompromised patient who had covid earlier this year - Isolation has been discontinued   History of PE (Aug 2022):  - No current hypoxia or evidence of RV overload.  - Restarted anticoagulation post-EGD. - Continue Lovenox, hopefully able to transition back to Xarelto if able to tolerate p.o. otherwise will need to discharge on Lovenox  Metastatic pancreatic CA:  - Continue care through [facility].  - Continues to be difficult case given above SBO likely secondary to metastatic disease, initial plan for PEG tube placement for symptom management and drainage, continue TPN in the outpatient setting with home health with outpatient follow-up with GI at [facility] in 2 weeks for hopeful stenting of small bowel to alleviate symptoms and increase p.o. intake and nutrition. - Lengthy discussion today at bedside with patient and wife to set expectations given metastatic disease and possible issues if GI stent fails outpatient.  AKI, hypernatremia: Resolved.  Neutropenia, anemia: Polychromasia present. Had some schistocytes previously, none seen 9/13 and hgb stable at 8.8 g/dl. WBC has normalized.  RN Pressure Injury Documentation: Pressure Injury 11/22/20 Sacrum Lower Stage 2 -  Partial thickness loss of dermis presenting as a shallow open injury with a red, pink wound bed without slough. Pink, no drainage. Surrounding skin in  tact. (Active)  11/22/20 [id number]  Location: Sacrum  Location Orientation: Lower  Staging: Stage 2 -  Partial thickness loss of dermis  presenting as a shallow open injury with a red, pink wound bed without slough.  Wound Description (Comments): Pink, no drainage. Surrounding skin in tact.  Present on Admission: Yes   DVT prophylaxis: Therapeutic Lovenox Code Status: Full Family Communication: Family at bedside Disposition Plan:  Status is: Inpatient  Remains inpatient appropriate because:IV treatments appropriate due to intensity of illness or inability to take PO  Dispo: The patient is from: Home              Anticipated d/c is to: Likely Home pending outpatient physician to follow TPN.             Patient currently is medically stable to d/c.    Difficult to place patient No  Consultants:  General surgery GI Palliative care  Procedures:  Small bowel enteroscopy 11/23/2020 Dr. Clarene Essex:  Findings:      Localized mild inflammation characterized by linear erosions was found       on the greater curvature of the stomach. From previous NG trauma      Diffuse dilated small bowel was found in the duodenal bulb, in the first       portion of the duodenum, in the second portion of the duodenum, in the       third portion of the duodenum and in the fourth portion of the duodenum.      An extrinsic stenosis that was non-traversed was found in the proximal       jejunum. Estimated blood loss: None. A guide wire was inserted into the       jejunum and the endoscope was removed. The colonoscope was advanced over       the guide wire into the duodenum. Placement was confirmed by X-ray.       Unfortunately the wire was accidentally withdrawn back into the duodenum       and could not be readvanced past the obstruction estimated blood loss:       none. Impression:       - Caustic gastritis.                           - Dilated duodenum was found in the duodenum.                           - Jejunal stenosis. Able to cross with the wire but                            unfortunately unable to keep wire in the proper                             position while we readvanced the colonoscope in                            order to place the stent                           - No specimens collected. Recommendation: - NPO indefinitely. Replace NG tube as needed will  allow him to have at least a few hours without it                           - Repeat the small bowel enteroscopy at a                            un[facility] if he would like 1 more try and using                            the side-viewing ERCP scope may be worthwhile.                           - Return to GI clinic PRN.                           - Telephone GI clinic if symptomatic PRN.                           - Refer to an interventional radiologist at                            appointment to be scheduled if PEG placement is                            desired.  Antimicrobials: None   Subjective: No acute issues or events overnight, patient still anxious and hesitant to advance any p.o. intake for fear of abdominal discomfort, we discussed needing to increase p.o. intake today if nothing else but to train him and his wife on how to empty gastric contents via PEG tube.  Otherwise denies headache fever chills chest pain shortness of breath  Objective: Vitals:   11/27/20 1427 11/27/20 1815 11/27/20 2017 11/28/20 0521  BP: 93/61 99/70 (!) 91/57 (!) 96/57  Pulse: 72 63 (!) 57 60  Resp: '18  18 18  '$ Temp: 99.4 F (37.4 C) 98.8 F (37.1 C) 98.4 F (36.9 C) 98.5 F (36.9 C)  TempSrc: Oral  Oral Oral  SpO2: 100% 98% 100% 99%  Weight:      Height:        Intake/Output Summary (Last 24 hours) at 11/28/2020 0746 Last data filed at 11/27/2020 2130 Gross per 24 hour  Intake 122.72 ml  Output 300 ml  Net -177.28 ml    Filed Weights   11/14/20 1839 11/24/20 1300  Weight: 57.7 kg 57.5 kg   Gen: 60 y.o. male in no distress Pulm: Nonlabored breathing room air. Clear. CV: Regular rate and rhythm. No murmur, rub, or gallop. No JVD,  no dependent edema. GI: Abdomen soft, non-tender, non-distended, with normoactive bowel sounds.  PEG tube site clean dry intact Ext: Warm, no deformities Skin: No new rashes, lesions or ulcers on visualized skin. Port site in left upper chest remains c/d/i Neuro: Alert and oriented. No focal neurological deficits. Psych: Judgement and insight appear fair. Mood euthymic & affect congruent. Behavior is appropriate.    Data Reviewed: I have personally reviewed following labs and imaging studies  CBC: Recent Labs  Lab 11/23/20 0722 11/26/20 0326  WBC 8.3 12.4*  NEUTROABS 4.5 8.0*  HGB 8.8* 8.2*  HCT 28.3* 26.6*  MCV 95.0 95.0  PLT 405* [id number]    Basic Metabolic Panel: Recent Labs  Lab 11/24/20 0352 11/25/20 0356 11/26/20 0326 11/27/20 0334 11/28/20 0339  NA 139 136 139 139 139  K 4.7 3.9 4.0 4.1 4.0  CL 104 98 100 101 101  CO2 30 30 32 30 31  GLUCOSE 107* 152* 136* 139* 115*  BUN 21* 18 24* 28* 30*  CREATININE 0.93 1.07 0.90 0.89 0.81  CALCIUM 9.4 9.1 9.0 8.9 8.8*  MG 2.0 2.1 2.0 1.9 2.0  PHOS 3.4 4.3 3.7 2.9 3.9    GFR: Estimated Creatinine Clearance: 80.8 mL/min (by C-G formula based on SCr of 0.81 mg/dL). Liver Function Tests: Recent Labs  Lab 11/24/20 0352 11/25/20 0356 11/26/20 0326 11/27/20 0334 11/28/20 0339  AST '18 19 16 18 '$ 51*  ALT '19 18 16 15 '$ 63*  ALKPHOS 127* 124 114 110 149*  BILITOT 0.5 0.4 0.7 0.7 0.5  PROT 6.8 6.5 6.5 6.1* 6.2*  ALBUMIN 2.9* 2.8* 2.9* 2.7* 2.8*    No results for input(s): LIPASE, AMYLASE in the last 168 hours. No results for input(s): AMMONIA in the last 168 hours. Coagulation Profile: Recent Labs  Lab 11/26/20 0326  INR 1.1    Cardiac Enzymes: No results for input(s): CKTOTAL, CKMB, CKMBINDEX, TROPONINI in the last 168 hours. BNP (last 3 results) No results for input(s): PROBNP in the last 8760 hours. HbA1C: No results for input(s): HGBA1C in the last 72 hours. CBG: Recent Labs  Lab 11/27/20 0757 11/27/20 1614  11/27/20 2025 11/28/20 0107 11/28/20 0724  GLUCAP 136* 87 130* 126* 113*    Lipid Profile: No results for input(s): CHOL, HDL, LDLCALC, TRIG, CHOLHDL, LDLDIRECT in the last 72 hours.  Thyroid Function Tests: No results for input(s): TSH, T4TOTAL, FREET4, T3FREE, THYROIDAB in the last 72 hours. Anemia Panel: No results for input(s): VITAMINB12, FOLATE, FERRITIN, TIBC, IRON, RETICCTPCT in the last 72 hours. Urine analysis:    Component Value Date/Time   COLORURINE YELLOW (A) 11/14/2020 1650   APPEARANCEUR CLEAR (A) 11/14/2020 1650   LABSPEC 1.020 11/14/2020 1650   PHURINE 5.5 11/14/2020 1650   GLUCOSEU NEGATIVE 11/14/2020 1650   HGBUR NEGATIVE 11/14/2020 1650   BILIRUBINUR SMALL (A) 11/14/2020 1650   KETONESUR NEGATIVE 11/14/2020 1650   PROTEINUR TRACE (A) 11/14/2020 1650   UROBILINOGEN 2.0 (H) 08/20/2006 1249   NITRITE NEGATIVE 11/14/2020 1650   LEUKOCYTESUR NEGATIVE 11/14/2020 1650   No results found for this or any previous visit (from the past 240 hour(s)).     Radiology Studies: IR GASTROSTOMY TUBE MOD SED  Result Date: 11/26/2020 INDICATION: Obstruction, gastrostomy tube needed for venting EXAM: Placement of a percutaneous gastrostomy tube using fluoroscopic guidance MEDICATIONS: Ancef 2 g; Antibiotics were administered within 1 hour of the procedure. Glucagon 1 mg IV ANESTHESIA/SEDATION: Versed 6 mg IV; Fentanyl 100 mcg IV Moderate Sedation Time:  31 The patient was continuously monitored during the procedure by the interventional radiology nurse under my direct supervision. CONTRAST:  20 mL Omnipaque 300-administered into the gastric lumen. FLUOROSCOPY TIME:  Fluoroscopy Time: 4 minutes 12 seconds (12 mGy). COMPLICATIONS: None immediate. PROCEDURE: Informed written consent was obtained from the patient after a thorough discussion of the procedural risks, benefits and alternatives. All questions were addressed. Maximal Sterile Barrier Technique was utilized including caps,  mask, sterile gowns, sterile gloves, sterile drape, hand hygiene and skin antiseptic. A timeout was performed prior to the initiation of the procedure. Review of pre-procedure CT scan demonstrates an adequate window  for percutaneous placement of a gastrostomy tube. The patient was placed supine on the exam table. Using the pre-existing nasogastric tube, the stomach was insufflated with air. The abdomen was prepped and draped in the standard sterile fashion. After insufflating the stomach with air, puncture sites were selected and local analgesia was obtained with 1% lidocaine. Using fluoroscopic guidance, a gastropexy needle was advanced into the stomach and the T-bar suture was released. Entry into the stomach was confirmed with fluoroscopy, aspiration of air, and injection of contrast material. This was repeated with an additional gastropexy suture (for a total of 2 fasteners). At the center of these gastropexy sutures, a dermatotomy was performed. An 18 gauge needle was then passed into the stomach, and position within the gastric lumen again confirmed under fluoroscopy using aspiration of air and injection of contrast material. An Amplatz guidewire was passed through this needle and intraluminal placement was confirmed with fluoroscopy. The needle was removed, and over the guidewire, a 14 French balloon gastrostomy tube with a coaxial 10 mm balloon was advanced into the percutaneous tract. Dilation of the percutaneous tract was then performed using the balloon, followed by advancement of the gastrostomy tube into the gastric lumen. The retention balloon was then inflated with 7 mL of sterile water, and the tube was brought back to the gastric wall. The wire and balloon were removed. The external bumper was brought to the skin. Location of the gastrostomy tube within the stomach was then confirmed with injection of contrast material, opacifying the gastric lumen. The gastrostomy tube was flushed with sterile  water, and secured to the skin using a dressing. The patient tolerated the procedure well without immediate complication, and was transferred to recovery in stable condition. IMPRESSION: Successful placement of a 59 French percutaneous balloon gastrostomy tube. The gastrostomy tube may be used immediately for venting. Electronically Signed   By: Albin Felling M.D.   On: 11/26/2020 17:28    Scheduled Meds:  bisacodyl  10 mg Rectal Daily   Chlorhexidine Gluconate Cloth  6 each Topical Daily   enoxaparin (LOVENOX) injection  60 mg Subcutaneous Q12H   insulin aspart  0-9 Units Subcutaneous 4 times per day   lip balm  1 application Topical BID   nystatin  5 mL Oral QID   sodium chloride flush  10-40 mL Intracatheter Q12H   Continuous Infusions:  sodium chloride 20 mL/hr at 11/24/20 1646   sodium chloride Stopped (11/26/20 1630)   dextrose 5 % and 0.45% NaCl 10 mL/hr at 11/22/20 1902   lactated ringers Stopped (11/23/20 1526)   methocarbamol (ROBAXIN) IV 1,000 mg (11/17/20 1313)   ondansetron (ZOFRAN) IV     TPN CYCLIC-ADULT (ION) 0000000 mL/hr at 11/27/20 1902     LOS: 14 days   Time spent: 35 minutes.  Little Ishikawa, DO  Pager: Secure chat  11/28/2020, 7:46 AM

## 2020-11-29 LAB — COMPREHENSIVE METABOLIC PANEL
ALT: 42 U/L (ref 0–44)
AST: 29 U/L (ref 15–41)
Albumin: 2.7 g/dL — ABNORMAL LOW (ref 3.5–5.0)
Alkaline Phosphatase: 125 U/L (ref 38–126)
Anion gap: 6 (ref 5–15)
BUN: 30 mg/dL — ABNORMAL HIGH (ref 6–20)
CO2: 31 mmol/L (ref 22–32)
Calcium: 8.9 mg/dL (ref 8.9–10.3)
Chloride: 102 mmol/L (ref 98–111)
Creatinine, Ser: 0.74 mg/dL (ref 0.61–1.24)
GFR, Estimated: >60 mL/min (ref >60)
Glucose, Bld: 122 mg/dL — ABNORMAL HIGH (ref 70–99)
Potassium: 4.2 mmol/L (ref 3.5–5.1)
Sodium: 139 mmol/L (ref 135–145)
Total Bilirubin: 0.4 mg/dL (ref 0.3–1.2)
Total Protein: 5.9 g/dL — ABNORMAL LOW (ref 6.5–8.1)

## 2020-11-29 LAB — TRIGLYCERIDES: Triglycerides: 74 mg/dL (ref <150)

## 2020-11-29 LAB — PHOSPHORUS: Phosphorus: 3.7 mg/dL (ref 2.5–4.6)

## 2020-11-29 LAB — MAGNESIUM: Magnesium: 2 mg/dL (ref 1.7–2.4)

## 2020-11-29 LAB — GLUCOSE, CAPILLARY
Glucose-Capillary: 110 mg/dL — ABNORMAL HIGH (ref 70–99)
Glucose-Capillary: 122 mg/dL — ABNORMAL HIGH (ref 70–99)

## 2020-11-29 MED ORDER — TRAVASOL 10 % IV SOLN
INTRAVENOUS | Status: DC
  Filled 2020-11-29: qty 1056

## 2020-11-29 MED ORDER — INSULIN ASPART 100 UNIT/ML IJ SOLN
0.0000 [IU] | INTRAMUSCULAR | Status: DC
Start: 2020-11-29 — End: 2020-11-29

## 2020-11-29 MED ORDER — SIMETHICONE 40 MG/0.6ML PO SUSP: 80.0000 mg | Freq: Four times a day (QID) | ORAL | 0 refills | Status: AC | PRN

## 2020-11-29 MED ORDER — MORPHINE SULFATE 20 MG/5ML PO SOLN: 2.5000 mg | ORAL | 0 refills | Status: AC | PRN

## 2020-11-29 MED ORDER — ONDANSETRON 4 MG PO TBDP: 8.0000 mg | ORAL_TABLET | Freq: Three times a day (TID) | ORAL | 0 refills | Status: AC | PRN

## 2020-11-29 MED ORDER — LORAZEPAM 2 MG/ML PO CONC: 1.0000 mg | Freq: Three times a day (TID) | ORAL | 0 refills | Status: AC | PRN

## 2020-11-29 MED ORDER — TRAVASOL 10 % IV SOLN: INTRAVENOUS | Status: DC

## 2020-11-29 MED ORDER — ESOMEPRAZOLE MAGNESIUM 10 MG PO PACK: 10.0000 mg | PACK | Freq: Every day | ORAL | 0 refills | Status: AC

## 2020-11-29 NOTE — Progress Notes (Signed)
PHARMACY - TOTAL PARENTERAL NUTRITION CONSULT NOTE   Indication: Small bowel obstruction  Patient Measurements: Height: 5' 8"  (172.7 cm) Weight: 57.5 kg (126 lb 12.2 oz) IBW/kg (Calculated) : 68.4 TPN AdjBW (KG): 57.7 Body mass index is 19.27 kg/m.  Assessment: 60 yo male with metastatic pancreatic cancer followed by [facility] [place] [facility] [place] now with continued SBO due to tumor to start TPN.   Glucose / Insulin: CBGs since TPN started at goal < 180 - range 110-131 while TPN infusing, 79-100 while TPN off - 1 units SSI required/24hr Electrolytes: All WNL Renal: SCr WNL, BUN slowly trending up 30 Hepatic: AST/ALT and Alk Phos improved to WNL, Tbili WNL, Albumin 2.7 TG: 85 (9/10), 68 (9/12), 74 (9/19) Intake / Output; MIVF: I/O inaccurate, UOP not quantified GI Imaging: GI Surgeries / Procedures:  9/13: unsuccessful attempt at duodenal/jejunal stent placement 9/16: IR placement of PEG tube for symptom management/venting  Central access: patient already with Port TPN start date: 9/10 9/14 RN called at 0330 to report patient pulled TPN line and it was disconnected from portacath then discarded; D10W at 80 ml/hr  ordered.  9/15 TPN restarted at goal rate, no interruptions noted  5/88 Changed to cyclic administration over 18hr.  [facility] aware. 3/25 Changed cyclic administration over 16hr. 4/98 Changed cyclic administration over 14hr.  Nutritional Goals: Goal TPN provides 105 g of protein and 1847 kcals per day   RD Assessment: Estimated Needs Total Energy Estimated Needs: 1800-2000 Total Protein Estimated Needs: 90-105g Total Fluid Estimated Needs: 2L/day  Current Nutrition:  Full liquids, TPN  Plan:  Adjust TPN to cyclic administration in preparation for discharge over 12hr starting at 18:00 tonight: 65m/hr x 1hr, 1716mhr x 10hr, 8733mr x 1hr, then off x 12hr  Electrolytes in TPN:  Na 100m69m  K 30mE41mCa 5mEq/44mMg 6mEq/L80mhos  7.5mmol/L60m:Ac 1:2  Add standard MVI and trace elements to TPN Continue Sensitive SSI 4x/day - 2hr post initiation of TPN, 1hr after discontinuation, once in the middle of infusion and once while off TPN IVF per MD - none currently Monitor TPN labs on Mon/Thurs, and as needed CMET, Mag, Phos in AM   Frank Garrett, BCPS Pharmacy: [phone] 7:26 AM

## 2020-11-29 NOTE — Discharge Summary (Signed)
Physician Discharge Summary  Frank Garrett [id number] DOB: 05-08-1960 DOA: 11/14/2020  PCP: Gregor Hams, FNP  Admit date: 11/14/2020 Discharge date: 11/29/2020  Admitted From: Home Disposition: Home  Recommendations for Outpatient Follow-up:  Follow up with PCP in 1-2 weeks Please obtain BMP/CBC in one week Please follow up with GI at [facility] as scheduled:  Home Health: None Equipment/Devices: None  Discharge Condition: Stable CODE STATUS: Full Diet recommendation: Full liquid diet as tolerated  Brief/Interim Summary: Frank Garrett is a 60 y.o. male with a history of pancreatic CA s/p Whipple 2018 and chemotherapy, breakthrough covid-19 infection Jan 2022 who presented to the ED 9/4 with nausea, vomiting due to small bowel obstruction which is felt to be due to adhesions from surgery vs. neoplastic invasion. Symptoms have improved dramatically with NG Tube, NPO status, and TPN has been started. GI attempt at endoscopic placement of stent at duodenum-jejunum junction on 9/13 was unsuccessful.  Initially requesting transfer to [facility] in [place] however given PEG tube placement and plan for outpatient enteroscopy at [facility] for stent placement in the next 2 weeks no longer requiring or requesting transfer to outside facility, we discussed this would be a lateral transfer as nothing was being offered at the [facility] facility that cannot be done in our system.    At this time patient is stable for discharge home with PEG tube for venting, tolerating p.o. liquids with planned outpatient follow-up with [facility] for GI stenting.  Patient's PCP here in town has been gracious enough to follow for outpatient TPN until patient can have GI procedure with plan for stent placement.  Lengthy discussion at bedside that if stent placement continues to be an issue or is unable to be placed he cannot remain on TPN indefinitely and will likely need to transition to palliative/hospice given stage IV disease with limited  intervention at this point.    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  - Continue TPN, started 9/10 -will need outpatient monitoring - Successful PEG tube placement 11/26/2020 with goals for further education on how to use this tube as a venting G-tube until GI stent can be attempted with [facility] outpatient GI - TPN to continue outpatient - Will need home health and close management to ensure appropriate nutrition until stenting can be attempted - Difficulty getting PCP/consult to follow TPN and labs which is delaying discharge - Lengthy discussion about needing to focus on palliative/hospice should stenting be unsuccessful again.   Positive covid-19 PCR, status post quarantine:  - Noted on 9/4. With cycle threshold of 33.6 and no attributable symptoms, this could be seen as a persistent (false) positive in an immunocompromised patient who had covid earlier this year - Isolation has been discontinued    History of PE (Aug 2022):  - No current hypoxia or evidence of RV overload.  - Restarted anticoagulation post-EGD. - Transition back to Xarelto now that he is tolerating p.o. more appropriately   metastatic pancreatic CA:  - Continue care through [place].  - Continues to be difficult case given above SBO likely secondary to metastatic disease, initial plan for PEG tube placement for symptom management and drainage, continue TPN in the outpatient setting with home health with outpatient follow-up with GI at [facility] in 2 weeks for hopeful stenting of small bowel to alleviate symptoms and increase p.o. intake and nutrition.   AKI, hypernatremia: Resolved.   Neutropenia, anemia: Polychromasia present. Had some schistocytes previously,  none seen 9/13 and hgb stable at 8.8 g/dl. WBC has normalized.   RN Pressure Injury Documentation: Pressure Injury 11/22/20 Sacrum Lower Stage 2 -  Partial thickness loss of dermis  presenting as a shallow open injury with a red, pink wound bed without slough. Pink, no drainage. Surrounding skin in tact. (Active)  11/22/20 0829  Location: Sacrum  Location Orientation: Lower  Staging: Stage 2 -  Partial thickness loss of dermis presenting as a shallow open injury with a red, pink wound bed without slough.  Wound Description (Comments): Pink, no drainage. Surrounding skin in tact.  Present on Admission: Yes   - I have reviewed and agree with the following documentation   Discharge Instructions  Discharge Instructions     Diet full liquid   Complete by: As directed    Discharge wound care:   Complete by: As directed    Sacrum Lower Stage 2 -  Partial thickness loss of dermis presenting as a shallow open injury with a red, pink wound bed without slough. Pink, no drainage. Surrounding skin in tact.  Keep site clean, dressing changes only PRN   Increase activity slowly   Complete by: As directed       Allergies as of 11/29/2020       Reactions   Oxycodone Other (See Comments)   Abdominal pain        Medication List     STOP taking these medications    ASTRAGALUS PO   capecitabine 500 MG tablet Commonly known as: XELODA   gabapentin 300 MG capsule Commonly known as: NEURONTIN   Melatonin 10 MG Caps   omeprazole 40 MG capsule Commonly known as: PRILOSEC   ondansetron 8 MG tablet Commonly known as: ZOFRAN   OVER THE COUNTER MEDICATION   OVER THE COUNTER MEDICATION   OVER THE COUNTER MEDICATION   traMADol 50 MG tablet Commonly known as: ULTRAM   Vitamin D3 125 MCG (5000 UT) Caps   VITAMIN E PO       TAKE these medications    diphenoxylate-atropine 2.5-0.025 MG tablet Commonly known as: LOMOTIL Take 2 tablets by mouth 4 (four) times daily as needed for diarrhea or loose stools.   esomeprazole 10 MG packet Commonly known as: NEXIUM Take 10 mg by mouth daily before breakfast.   LORazepam 2 MG/ML concentrated solution Commonly  known as: ATIVAN Take 0.5 mLs (1 mg total) by mouth every 8 (eight) hours as needed for anxiety.   morphine 20 MG/5ML solution Take 0.6 mLs (2.4 mg total) by mouth every 4 (four) hours as needed for up to 5 days for pain.   ondansetron 4 MG disintegrating tablet Commonly known as: Zofran ODT Take 2 tablets (8 mg total) by mouth every 8 (eight) hours as needed for nausea or vomiting.   Pancreaze 21000-54700 units Cpep Generic drug: Pancrelipase (Lip-Prot-Amyl) Take 1 capsule by mouth 3 (three) times daily.   PANPLEX 2-PHASE PO Take 1 capsule by mouth 3 (three) times daily with meals.   rivaroxaban 20 MG Tabs tablet Commonly known as: XARELTO Take 20 mg by mouth daily with supper.   simethicone 40 MG/0.6ML drops Commonly known as: MYLICON Take 1.2 mLs (80 mg total) by mouth 4 (four) times daily as needed for flatulence (PRN bloating, gassiness).   Vitamin B-12 1000 MCG/15ML Liqd Take 1,000 mcg by mouth every morning.               Discharge Care Instructions  (From admission, onward)  Start     Ordered   11/29/20 0000  Discharge wound care:       Comments: Sacrum Lower Stage 2 -  Partial thickness loss of dermis presenting as a shallow open injury with a red, pink wound bed without slough. Pink, no drainage. Surrounding skin in tact.  Keep site clean, dressing changes only PRN   11/29/20 1311            Allergies  Allergen Reactions   Oxycodone Other (See Comments)    Abdominal pain     Consultations: GI, surgery, oncology  Procedures/Studies: DG Abd 1 View  Result Date: 11/23/2020 CLINICAL DATA:  NG tube placement EXAM: ABDOMEN - 1 VIEW COMPARISON:  11/19/2020 FINDINGS: NG tube is in the stomach.  Nonobstructive bowel gas pattern. IMPRESSION: NG tube in the stomach. Electronically Signed   By: Rolm Baptise M.D.   On: 11/23/2020 17:13   CT Abdomen Pelvis W Contrast  Result Date: 11/14/2020 CLINICAL DATA:  Nausea/vomiting EXAM: CT ABDOMEN  AND PELVIS WITH CONTRAST TECHNIQUE: Multidetector CT imaging of the abdomen and pelvis was performed using the standard protocol following bolus administration of intravenous contrast. CONTRAST:  97m OMNIPAQUE IOHEXOL 350 MG/ML SOLN COMPARISON:  None. FINDINGS: Lower chest: No acute abnormality. Hepatobiliary: There are multiple new hypodense liver lesions, many of which with peripheral enhancement, largest measuring 6.6 cm in the central liver (series 2, image 13). Prior cholecystectomy. Pancreas: Prior distal pancreatectomy and splenectomy. Residual pancreatic head tissue noted and unremarkable. Spleen: Splenectomy. Adrenals/Urinary Tract: Adrenal glands are unremarkable. Kidneys are normal, without renal calculi, focal lesion, or hydronephrosis. Bladder is unremarkable. Stomach/Bowel: Dilated stomach and duodenum. There is an abrupt transition point at the duodenal jejunal junction (sagittal image 102). There is wall thickening of the colon at the hepatic flexure with mild adjacent stranding. The appendix is normal. Vascular/Lymphatic: Aortoiliac atherosclerotic calcifications. No AAA. There is severe narrowing of the portal vein and superior mesenteric vein with some collateral vessels noted. The celiac trunk is either completely obstructed external compression or there is a common celiac trunk and superior mesenteric artery. There is a hepatic artery arising from the SMA, likely right hepatic artery, which appears severely narrowed coursing through this soft tissue. Reproductive: Unremarkable. Other: Prominent soft tissue density in the lesser sac, surrounding the superior mesenteric artery takeoff, and encasing the proximal portal vein and portal-SMV confluence, severely narrowing these vessels. This soft tissue abuts the lesser curvature of the stomach in the left adrenal gland. Musculoskeletal: No acute osseous abnormality. No suspicious lytic or blastic lesions. There is a nodular density in the  subcutaneous tissues of the right hemiabdomen measuring 7 mm (series 2, image 42). IMPRESSION: High-grade bowel obstruction with marked dilation of the stomach and duodenum, abrupt transition point at the duodenal-jejunal junction. This is either due to adhesions or metastatic disease. There is prominent soft tissue density in the lesser sac, encasing the superior mesenteric artery takeoff, portal vein and portal-SMV confluence, and right hepatic artery, consistent with metastatic disease. The celiac trunk is not visualized, either obstructed by external compression or there is a common trunk of the celiac and SMA. The SMA is patent. Severe narrowing of the proximal portal vein and portal-SMV confluence. Severe narrowing of the right hepatic artery which appears to arise from the SMA. Multiple hypodense liver lesions, new from prior exam in August 2018, consistent with metastatic disease. Largest lesion measures up to 6.6 cm in the central liver, which demonstrates lower density than several other lesions and  may represent a necrotic metastasis, possibly treatment related. Correlation with any more recent prior imaging, if available, would be useful. Nodular soft tissue density in the subcutaneous tissues along the right hemiabdomen measuring 7 mm, again correlation with prior exams would be useful. Focal colonic wall thickening with adjacent stranding at the hepatic flexure, consistent with mild focal colitis. These results were called by telephone at the time of interpretation on 11/14/2020 at 1:30 pm to provider HALEY SAGE , who verbally acknowledged these results. Electronically Signed   By: Maurine Simmering M.D.   On: 11/14/2020 13:46   IR GASTROSTOMY TUBE MOD SED  Result Date: 11/26/2020 INDICATION: Obstruction, gastrostomy tube needed for venting EXAM: Placement of a percutaneous gastrostomy tube using fluoroscopic guidance MEDICATIONS: Ancef 2 g; Antibiotics were administered within 1 hour of the procedure.  Glucagon 1 mg IV ANESTHESIA/SEDATION: Versed 6 mg IV; Fentanyl 100 mcg IV Moderate Sedation Time:  31 The patient was continuously monitored during the procedure by the interventional radiology nurse under my direct supervision. CONTRAST:  20 mL Omnipaque 300-administered into the gastric lumen. FLUOROSCOPY TIME:  Fluoroscopy Time: 4 minutes 12 seconds (12 mGy). COMPLICATIONS: None immediate. PROCEDURE: Informed written consent was obtained from the patient after a thorough discussion of the procedural risks, benefits and alternatives. All questions were addressed. Maximal Sterile Barrier Technique was utilized including caps, mask, sterile gowns, sterile gloves, sterile drape, hand hygiene and skin antiseptic. A timeout was performed prior to the initiation of the procedure. Review of pre-procedure CT scan demonstrates an adequate window for percutaneous placement of a gastrostomy tube. The patient was placed supine on the exam table. Using the pre-existing nasogastric tube, the stomach was insufflated with air. The abdomen was prepped and draped in the standard sterile fashion. After insufflating the stomach with air, puncture sites were selected and local analgesia was obtained with 1% lidocaine. Using fluoroscopic guidance, a gastropexy needle was advanced into the stomach and the T-bar suture was released. Entry into the stomach was confirmed with fluoroscopy, aspiration of air, and injection of contrast material. This was repeated with an additional gastropexy suture (for a total of 2 fasteners). At the center of these gastropexy sutures, a dermatotomy was performed. An 18 gauge needle was then passed into the stomach, and position within the gastric lumen again confirmed under fluoroscopy using aspiration of air and injection of contrast material. An Amplatz guidewire was passed through this needle and intraluminal placement was confirmed with fluoroscopy. The needle was removed, and over the guidewire, a 14  French balloon gastrostomy tube with a coaxial 10 mm balloon was advanced into the percutaneous tract. Dilation of the percutaneous tract was then performed using the balloon, followed by advancement of the gastrostomy tube into the gastric lumen. The retention balloon was then inflated with 7 mL of sterile water, and the tube was brought back to the gastric wall. The wire and balloon were removed. The external bumper was brought to the skin. Location of the gastrostomy tube within the stomach was then confirmed with injection of contrast material, opacifying the gastric lumen. The gastrostomy tube was flushed with sterile water, and secured to the skin using a dressing. The patient tolerated the procedure well without immediate complication, and was transferred to recovery in stable condition. IMPRESSION: Successful placement of a 50 French percutaneous balloon gastrostomy tube. The gastrostomy tube may be used immediately for venting. Electronically Signed   By: Albin Felling M.D.   On: 11/26/2020 17:28   DG CHEST PORT 1  VIEW  Result Date: 11/16/2020 CLINICAL DATA:  Emesis EXAM: PORTABLE CHEST 1 VIEW COMPARISON:  Chest x-ray dated Jul 11, 2016 FINDINGS: Left chest wall port with the tip near the superior cavoatrial junction. NG tube partially seen coursing below the diaphragm. The heart size and mediastinal contours are within normal limits. Both lungs are clear. The visualized skeletal structures are unremarkable. IMPRESSION: Lungs are clear. Electronically Signed   By: Yetta Glassman M.D.   On: 11/16/2020 12:50   DG Abd 2 Views  Result Date: 11/18/2020 CLINICAL DATA:  Small bowel obstruction. EXAM: ABDOMEN - 2 VIEW COMPARISON:  11/16/2020 FINDINGS: NG tube is looped in the stomach. Lung bases are unremarkable. No gaseous small bowel dilatation on the current study. Contrast material seen in the colon previously has passed in the interval. Visualized bony anatomy unremarkable. IMPRESSION: No evidence for  gaseous small bowel dilatation on the current study. Electronically Signed   By: Misty Stanley M.D.   On: 11/18/2020 11:51   DG Abd Portable 1V-Small Bowel Obstruction Protocol-24 hr delay  Result Date: 11/16/2020 CLINICAL DATA:  Small-bowel protocol, small-bowel obstruction, 24 hour postcontrast imaging EXAM: PORTABLE ABDOMEN - 1 VIEW COMPARISON:  11/15/2020 FINDINGS: Nasogastric tube remains coiled within the gastric fundus. Previously noted dilated loops of small bowel within the epigastrium containing dilute contrast are no longer visualized. Contrast is seen within the decompressed distal colon and rectal vault. Normal abdominal gas pattern. No gross free intraperitoneal gas. IMPRESSION: Normal abdominal gas pattern. Administered oral contrast is now seen within the distal colon and rectal vault. Electronically Signed   By: Fidela Salisbury M.D.   On: 11/16/2020 01:15   DG Abd Portable 1V-Small Bowel Obstruction Protocol-initial, 8 hr delay  Result Date: 11/15/2020 CLINICAL DATA:  8 hour delay image for small bowel obstruction. EXAM: PORTABLE ABDOMEN - 1 VIEW COMPARISON:  Radiographs 11/15/2020 and 11/14/2020.  CT 11/14/2020. FINDINGS: 0912 hours. No enteric contrast is visible on the recent prior studies. Nasogastric tube is looped in the proximal stomach which appears decompressed. There is faint contrast material within the duodenum which remains markedly dilated. Some contrast has passed into the colon which is normal in caliber. There is residual faint contrast material in the bladder from previous CT. No extravasated contrast or signs of free air. The bones appear unremarkable. IMPRESSION: Some contrast has passed into the colon which is normal in caliber. However, there is persistent significant dilatation of the duodenum consistent with a high-grade partial bowel obstruction as seen on CT. Electronically Signed   By: Richardean Sale M.D.   On: 11/15/2020 09:31   DG Abd Portable 1V  Result Date:  11/15/2020 CLINICAL DATA:  NG tube position EXAM: PORTABLE ABDOMEN - 1 VIEW COMPARISON:  11/14/2020 FINDINGS: NG tube coils in the fundus of the stomach, unchanged since prior study. IMPRESSION: NG tube continues to coil in the fundus of the stomach. Electronically Signed   By: Rolm Baptise M.D.   On: 11/15/2020 00:59   DG Abd Portable 1V  Result Date: 11/14/2020 CLINICAL DATA:  NG tube EXAM: PORTABLE ABDOMEN - 1 VIEW COMPARISON:  11/14/2020 FINDINGS: NG tube coils in the fundus of the stomach. IMPRESSION: NG tube in the fundus of the stomach. Electronically Signed   By: Rolm Baptise M.D.   On: 11/14/2020 23:45   DG Abd Portable 1V-Small Bowel Protocol-Position Verification  Result Date: 11/14/2020 CLINICAL DATA:  NG tube placement. EXAM: PORTABLE ABDOMEN - 1 VIEW COMPARISON:  CT earlier today. FINDINGS: Tip and side  port of the enteric tube below the diaphragm in the stomach. No gaseous distention of bowel loops in the upper abdomen. Excreted IV contrast in the renal collecting systems. IMPRESSION: Tip and side port of the enteric tube below the diaphragm in the stomach. Electronically Signed   By: Keith Rake M.D.   On: 11/14/2020 18:47   DG Abd Portable 2V  Result Date: 11/19/2020 CLINICAL DATA:  Continued nausea, abdominal pain, distention, NG tube in place EXAM: PORTABLE ABDOMEN - 2 VIEW COMPARISON:  Radiograph 11/18/2020 FINDINGS: There is a nasogastric tube with tip and side port overlying the stomach. Partially visualized central venous catheter with tip overlying the superior cavoatrial junction. There is contrast material throughout the colon. No visible small bowel dilation on the current study. IMPRESSION: Contrast material has passed into the colon, without visible stomach or duodenal dilation on the current exam. Electronically Signed   By: Maurine Simmering M.D.   On: 11/19/2020 12:59   DG UGI W SINGLE CM (SOL OR THIN BA)  Result Date: 11/18/2020 CLINICAL DATA:  CT demonstrating small-bowel  obstruction at the duodenal/jejunal junction. History of Whipple procedure for pancreatic cancer in 2018 EXAM: UPPER GI SERIES WITH KUB TECHNIQUE: After obtaining a scout radiograph a focused upper GI series was performed using thin barium FLUOROSCOPY TIME:  Fluoroscopy Time:  5 minutes and 5 seconds Radiation Exposure Index (if provided by the fluoroscopic device): Number of Acquired Spot Images: 0 COMPARISON:  CT of 11/14/2020 FINDINGS: Preprocedure scout film demonstrates a nasogastric tube in the proximal stomach. No residual gastric distension. Administration of 180 cc of dilute barium with placement in supine and right-side-down decubitus imaging demonstrates normal caliber of the stomach. The duodenal C loop is dilated, on the order of 3.3 cm. Despite contrast administration and repositioning, contrast does not fill the proximal jejunum. IMPRESSION: Resolution of gastric obstruction, in the setting of nasogastric tube placement. Residual duodenal dilatation with lack of contrast passage into the jejunum. Findings again favor high-grade obstruction at the duodenal/jejunal junction. Consider plain film follow-up to evaluate for eventual opacification of the jejunum. Electronically Signed   By: Abigail Miyamoto M.D.   On: 11/18/2020 15:53   DG C-Arm 1-60 Min  Result Date: 11/23/2020 CLINICAL DATA:  Endoscopy and stent placement EXAM: DG C-ARM 1-60 MIN FLUOROSCOPY TIME:  Fluoroscopy Time:  8 minutes 47 seconds Radiation Exposure Index (if provided by the fluoroscopic device): 91.41 mGy Number of Acquired Spot Images: 13 COMPARISON:  11/18/2020 FINDINGS: Thirteen fluoroscopic images are obtained during the performance of the procedure and are provided for interpretation only. And scope overlies dilated gas-filled loops of bowel within the mid abdomen. Contrast is seen outlining the loops of bowel. Please refer to operative report. IMPRESSION: 1. Intraoperative evaluation as above. Please refer to the operative  report. Electronically Signed   By: Randa Ngo M.D.   On: 11/23/2020 16:20     Subjective: No acute issues or events overnight denies nausea vomiting diarrhea constipation headache fevers chills or chest pain   Discharge Exam: Vitals:   11/29/20 0456 11/29/20 1217  BP: 98/62 (!) 92/55  Pulse: 63 (!) 57  Resp: 20 20  Temp: 98.5 F (36.9 C) 97.7 F (36.5 C)  SpO2: 100% 100%   Vitals:   11/28/20 1339 11/28/20 2006 11/29/20 0456 11/29/20 1217  BP: (!) 93/58 (!) 95/59 98/62 (!) 92/55  Pulse: 60 63 63 (!) 57  Resp: '18 18 20 20  '$ Temp: 98.4 F (36.9 C) 98.7 F (37.1 C)  98.5 F (36.9 C) 97.7 F (36.5 C)  TempSrc: Oral Oral Oral Oral  SpO2: 100% 100% 100% 100%  Weight:      Height:        Gen: 60 y.o. male in no distress Pulm: Nonlabored breathing room air. Clear. CV: Regular rate and rhythm. No murmur, rub, or gallop. No JVD, no dependent edema. GI: Abdomen soft, non-tender, non-distended, with normoactive bowel sounds.  PEG tube site clean dry intact Ext: Warm, no deformities Skin: No new rashes, lesions or ulcers on visualized skin. Port site in left upper chest remains c/d/i Neuro: Alert and oriented. No focal neurological deficits. Psych: Judgement and insight appear fair. Mood euthymic & affect congruent. Behavior is appropriate.    The results of significant diagnostics from this hospitalization (including imaging, microbiology, ancillary and laboratory) are listed below for reference.    Microbiology: No results found for this or any previous visit (from the past 240 hour(s)).   Labs: BNP (last 3 results) No results for input(s): BNP in the last 8760 hours. Basic Metabolic Panel: Recent Labs  Lab 11/25/20 0356 11/26/20 0326 11/27/20 0334 11/28/20 0339 11/29/20 0315  NA 136 139 139 139 139  K 3.9 4.0 4.1 4.0 4.2  CL 98 100 101 101 102  CO2 30 32 '30 31 31  '$ GLUCOSE 152* 136* 139* 115* 122*  BUN 18 24* 28* 30* 30*  CREATININE 1.07 0.90 0.89 0.81 0.74   CALCIUM 9.1 9.0 8.9 8.8* 8.9  MG 2.1 2.0 1.9 2.0 2.0  PHOS 4.3 3.7 2.9 3.9 3.7   Liver Function Tests: Recent Labs  Lab 11/25/20 0356 11/26/20 0326 11/27/20 0334 11/28/20 0339 11/29/20 0315  AST '19 16 18 '$ 51* 29  ALT '18 16 15 '$ 63* 42  ALKPHOS 124 114 110 149* 125  BILITOT 0.4 0.7 0.7 0.5 0.4  PROT 6.5 6.5 6.1* 6.2* 5.9*  ALBUMIN 2.8* 2.9* 2.7* 2.8* 2.7*   No results for input(s): LIPASE, AMYLASE in the last 168 hours. No results for input(s): AMMONIA in the last 168 hours. CBC: Recent Labs  Lab 11/23/20 0722 11/26/20 0326  WBC 8.3 12.4*  NEUTROABS 4.5 8.0*  HGB 8.8* 8.2*  HCT 28.3* 26.6*  MCV 95.0 95.0  PLT 405* 378   Cardiac Enzymes: No results for input(s): CKTOTAL, CKMB, CKMBINDEX, TROPONINI in the last 168 hours. BNP: Invalid input(s): POCBNP CBG: Recent Labs  Lab 11/28/20 1149 11/28/20 1706 11/28/20 2003 11/29/20 0023 11/29/20 0809  GLUCAP 100* 79 131* 110* 122*   D-Dimer No results for input(s): DDIMER in the last 72 hours. Hgb A1c No results for input(s): HGBA1C in the last 72 hours. Lipid Profile Recent Labs    11/29/20 0315  TRIG 74   Thyroid function studies No results for input(s): TSH, T4TOTAL, T3FREE, THYROIDAB in the last 72 hours.  Invalid input(s): FREET3 Anemia work up No results for input(s): VITAMINB12, FOLATE, FERRITIN, TIBC, IRON, RETICCTPCT in the last 72 hours. Urinalysis    Component Value Date/Time   COLORURINE YELLOW (A) 11/14/2020 1650   APPEARANCEUR CLEAR (A) 11/14/2020 1650   LABSPEC 1.020 11/14/2020 1650   PHURINE 5.5 11/14/2020 1650   GLUCOSEU NEGATIVE 11/14/2020 1650   HGBUR NEGATIVE 11/14/2020 1650   BILIRUBINUR SMALL (A) 11/14/2020 1650   KETONESUR NEGATIVE 11/14/2020 1650   PROTEINUR TRACE (A) 11/14/2020 1650   UROBILINOGEN 2.0 (H) 08/20/2006 1249   NITRITE NEGATIVE 11/14/2020 1650   LEUKOCYTESUR NEGATIVE 11/14/2020 1650   Sepsis Labs Invalid input(s): PROCALCITONIN,  WBC,  LACTICIDVEN  Microbiology No  results found for this or any previous visit (from the past 240 hour(s)).   Time coordinating discharge: Over 30 minutes  SIGNED:   Little Ishikawa, DO [facility] 11/29/2020, 1:12 PM Pager   If 7PM-7AM, please contact night-coverage [email or web address]

## 2021-01-12 ENCOUNTER — Other Ambulatory Visit (HOSPITAL_COMMUNITY): Payer: Self-pay | Admitting: Gastroenterology

## 2021-01-12 DIAGNOSIS — R1310 Dysphagia, unspecified: Secondary | ICD-10-CM

## 2021-01-13 ENCOUNTER — Other Ambulatory Visit: Payer: Self-pay

## 2021-01-13 ENCOUNTER — Ambulatory Visit (HOSPITAL_COMMUNITY)
Admission: RE | Admit: 2021-01-13 | Discharge: 2021-01-13 | Disposition: A | Discharge status: Home / Self Care | Payer: Commercial Managed Care - PPO | Source: Ambulatory Visit | Attending: Gastroenterology | Admitting: Gastroenterology

## 2021-01-13 DIAGNOSIS — Z431 Encounter for attention to gastrostomy: Secondary | ICD-10-CM | POA: Insufficient documentation

## 2021-01-13 DIAGNOSIS — R1310 Dysphagia, unspecified: Secondary | ICD-10-CM | POA: Diagnosis not present

## 2021-01-13 HISTORY — PX: IR REPLC GASTRO/COLONIC TUBE PERCUT W/FLUORO: IMG2333

## 2021-01-13 MED ORDER — LIDOCAINE VISCOUS HCL 2 % MT SOLN
OROMUCOSAL | Status: AC
  Filled 2021-01-13: qty 15

## 2021-03-13 DEATH — deceased

## 2022-07-12 IMAGING — XA IR REPLACE G-TUBE/COLONIC TUBE
3 series · 7 of 7 positions shown · non-contrast
Comparison: none

INDICATION: 60-year-old male with a 14 French percutaneous gastrostomy tube used
for palliative decompression. The catheter was placed on 11/26/2020
but often becomes clogged and has been insufficient for adequate
drainage. He presents today for exchange and up size.

[Series 1: single · 1 of 1 slices shown]
[im 1/1]
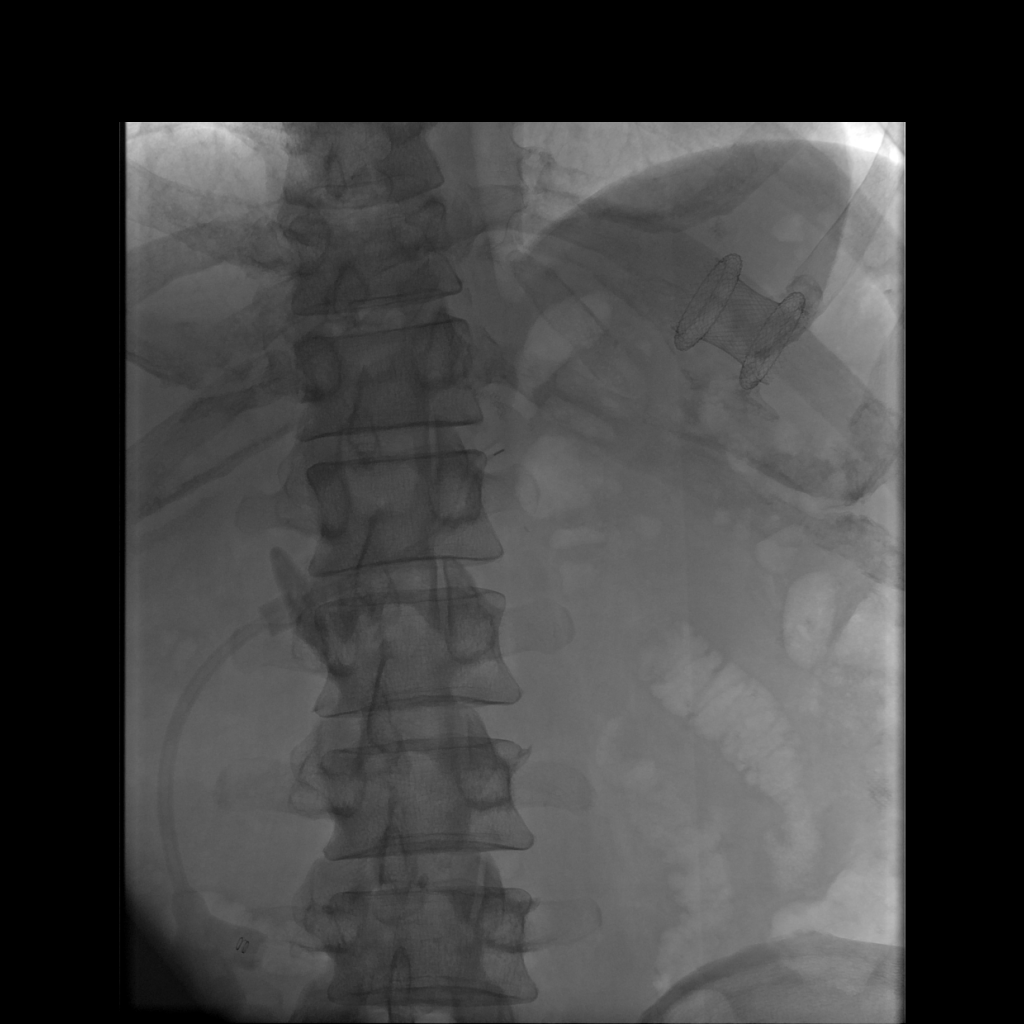

[Series 2: fl (-) angio · 2 acquisitions, 5 frames shown (1 of 2)]
[im 1/2]
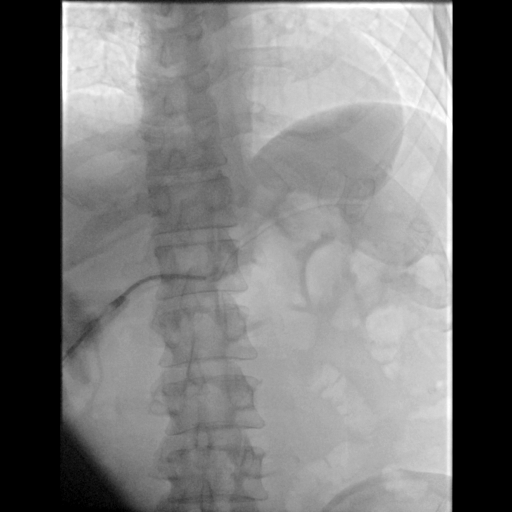
[im 1/2]
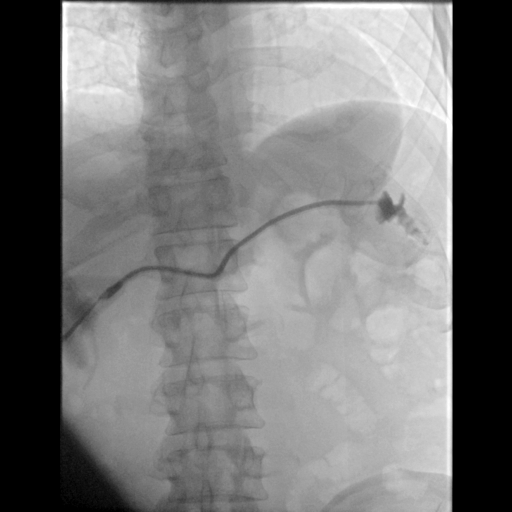
[im 1/2]
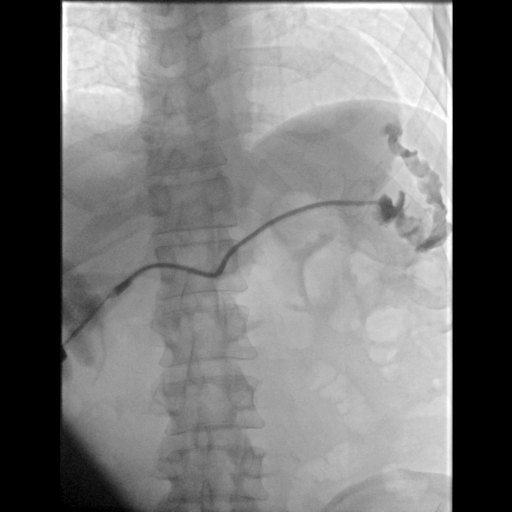
[im 1/2]
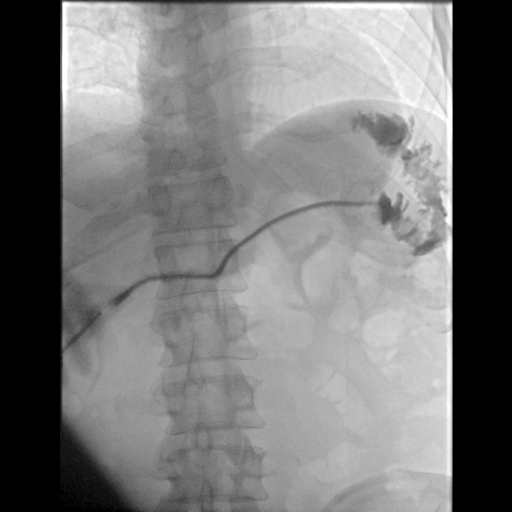
[im 2/2]
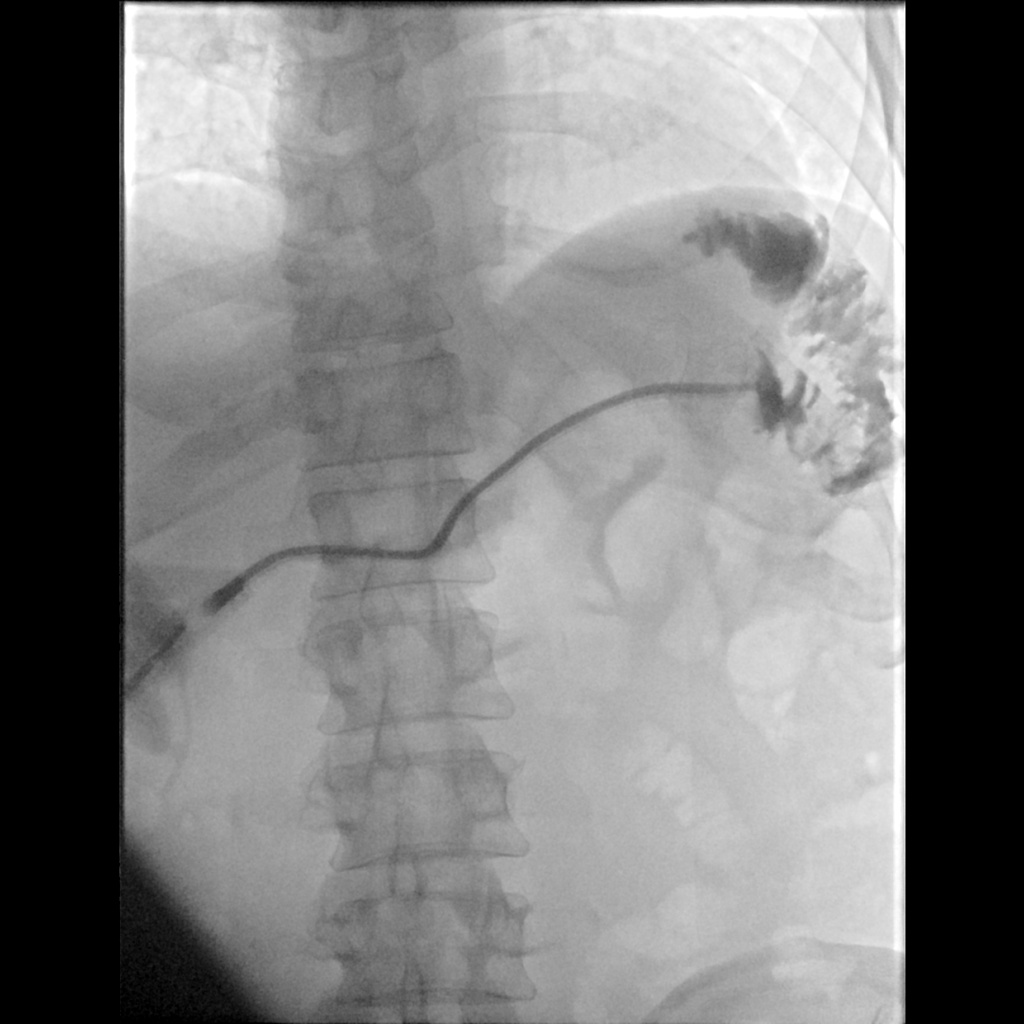

[Series 3: fl (-) angio · 1 of 1 slices shown (2 of 2)]
[im 1/1]
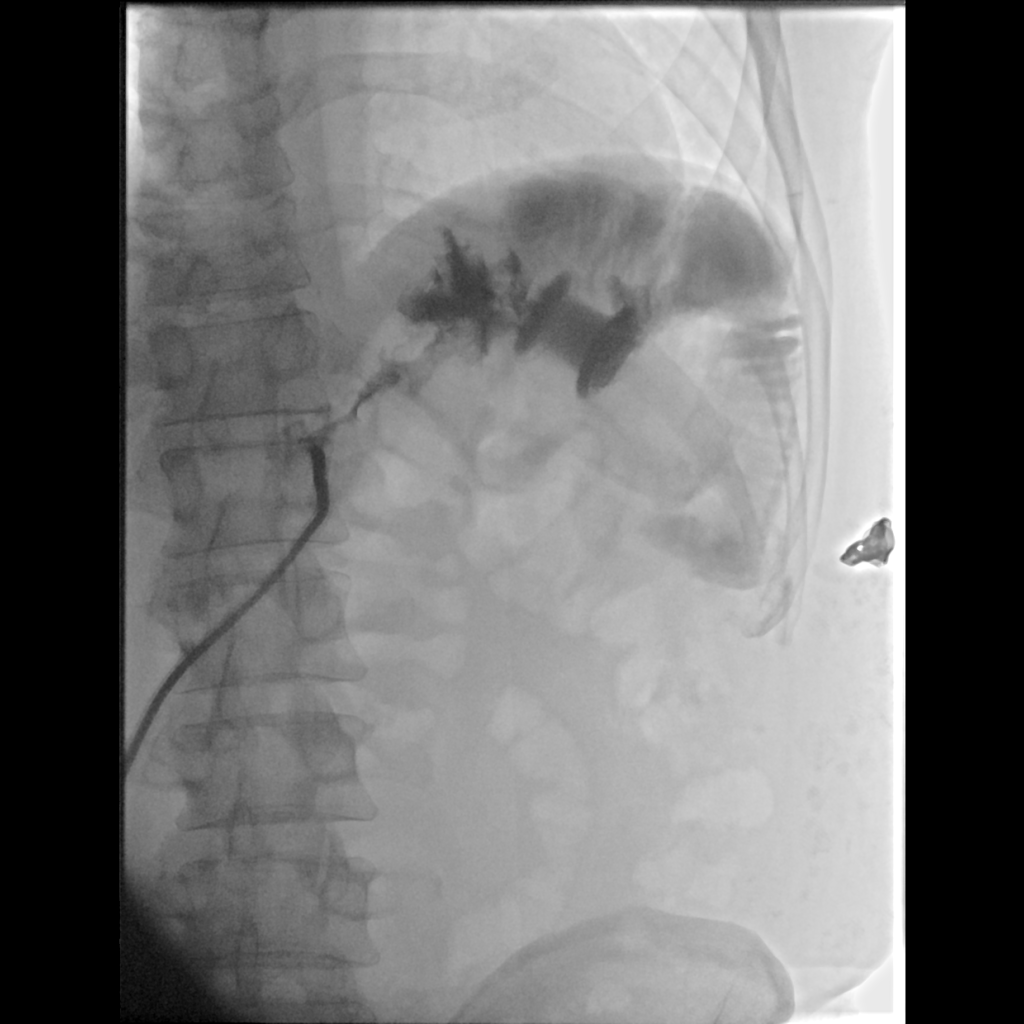

[7 of 7 positions shown; findings below may reference images not displayed]

EXAM:
GASTROSTOMY CATHETER REPLACEMENT

MEDICATIONS:
None.

ANESTHESIA/SEDATION:
None.

CONTRAST:  20 mL Omnipaque 300-administered into the gastric lumen.

FLUOROSCOPY TIME:  Fluoroscopy Time: 0 minutes 12 seconds (1 mGy).

COMPLICATIONS:
None immediate.

PROCEDURE:
Informed written consent was obtained from the patient after a
thorough discussion of the procedural risks, benefits and
alternatives. All questions were addressed. Maximal Sterile Barrier
Technique was utilized including caps, mask, sterile gowns, sterile
gloves, sterile drape, hand hygiene and skin antiseptic. A timeout
was performed prior to the initiation of the procedure.

The retention balloon of the existing tube was deflated. The tube
was removed over an Amplatz wire. The percutaneous tract was then
serially dilated to 20 French.

An 18 French balloon retention percutaneous gastrostomy tube was
then carefully advanced over the wire and successfully into the
stomach. The retention balloon was inflated the. Initial contrast
injection demonstrates the tube is within the jejunum. Therefore,
the retention balloon was partially deflated the tube brought back
closer to the anterior abdominal wall surface. The retention balloon
was reinflated. Repeat contrast injection was performed confirming
the tubes present within the stomach.

The external bumper was fixed in place. The tube was flushed with
saline.
IMPRESSION: Successful exchange for a larger 18 French balloon retention
percutaneous gastrostomy tube.
